# Patient Record
Sex: Female | Born: 1985
Health system: Southern US, Community
[De-identification: ages and names within clinical notes are randomized; demographics above are authoritative.]

## PROBLEM LIST (undated history)

## (undated) ENCOUNTER — Ambulatory Visit (HOSPITAL_COMMUNITY)

## (undated) DIAGNOSIS — T7840XA Allergy, unspecified, initial encounter: Secondary | ICD-10-CM

## (undated) DIAGNOSIS — A630 Anogenital (venereal) warts: Secondary | ICD-10-CM

## (undated) HISTORY — DX: Allergy, unspecified, initial encounter: T78.40XA

## (undated) HISTORY — DX: Anogenital (venereal) warts: A63.0

## (undated) HISTORY — PX: LASER ABLATION OF CONDYLOMAS: SHX1948

---
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
## 2005-04-13 ENCOUNTER — Ambulatory Visit (HOSPITAL_COMMUNITY): Admission: RE | Admit: 2005-04-13 | Discharge: 2005-04-13 | Payer: Self-pay | Admitting: Obstetrics & Gynecology

## 2005-05-03 ENCOUNTER — Inpatient Hospital Stay (HOSPITAL_COMMUNITY): Admission: AD | Admit: 2005-05-03 | Discharge: 2005-05-03 | Payer: Self-pay | Admitting: Obstetrics

## 2005-06-04 ENCOUNTER — Observation Stay (HOSPITAL_COMMUNITY): Admission: AD | Admit: 2005-06-04 | Discharge: 2005-06-05 | Payer: Self-pay | Admitting: Obstetrics

## 2005-06-16 ENCOUNTER — Ambulatory Visit (HOSPITAL_COMMUNITY): Admission: RE | Admit: 2005-06-16 | Discharge: 2005-06-16 | Payer: Self-pay | Admitting: Obstetrics & Gynecology

## 2005-08-16 ENCOUNTER — Inpatient Hospital Stay (HOSPITAL_COMMUNITY): Admission: AD | Admit: 2005-08-16 | Discharge: 2005-08-17 | Payer: Self-pay | Admitting: Obstetrics & Gynecology

## 2005-09-18 ENCOUNTER — Inpatient Hospital Stay (HOSPITAL_COMMUNITY): Admission: RE | Admit: 2005-09-18 | Discharge: 2005-09-18 | Payer: Self-pay | Admitting: Obstetrics & Gynecology

## 2005-10-16 ENCOUNTER — Observation Stay (HOSPITAL_COMMUNITY): Admission: AD | Admit: 2005-10-16 | Discharge: 2005-10-16 | Payer: Self-pay | Admitting: Obstetrics

## 2005-10-17 ENCOUNTER — Observation Stay (HOSPITAL_COMMUNITY): Admission: AD | Admit: 2005-10-17 | Discharge: 2005-10-17 | Payer: Self-pay | Admitting: Obstetrics

## 2005-10-20 ENCOUNTER — Inpatient Hospital Stay (HOSPITAL_COMMUNITY): Admission: AD | Admit: 2005-10-20 | Discharge: 2005-10-20 | Payer: Self-pay | Admitting: Obstetrics

## 2005-11-07 ENCOUNTER — Inpatient Hospital Stay (HOSPITAL_COMMUNITY): Admission: AD | Admit: 2005-11-07 | Discharge: 2005-11-09 | Payer: Self-pay | Admitting: Obstetrics & Gynecology

## 2006-04-22 ENCOUNTER — Inpatient Hospital Stay (HOSPITAL_COMMUNITY): Admission: AD | Admit: 2006-04-22 | Discharge: 2006-04-22 | Payer: Self-pay | Admitting: Obstetrics

## 2007-12-22 ENCOUNTER — Emergency Department (HOSPITAL_COMMUNITY): Admission: EM | Admit: 2007-12-22 | Discharge: 2007-12-22 | Payer: Self-pay | Admitting: Emergency Medicine

## 2008-03-21 ENCOUNTER — Inpatient Hospital Stay (HOSPITAL_COMMUNITY): Admission: AD | Admit: 2008-03-21 | Discharge: 2008-03-21 | Payer: Self-pay | Admitting: Obstetrics and Gynecology

## 2008-03-28 ENCOUNTER — Inpatient Hospital Stay (HOSPITAL_COMMUNITY): Admission: AD | Admit: 2008-03-28 | Discharge: 2008-03-28 | Payer: Self-pay | Admitting: Obstetrics & Gynecology

## 2008-05-10 ENCOUNTER — Emergency Department (HOSPITAL_COMMUNITY): Admission: EM | Admit: 2008-05-10 | Discharge: 2008-05-10 | Payer: Self-pay | Admitting: Emergency Medicine

## 2008-05-29 ENCOUNTER — Ambulatory Visit (HOSPITAL_COMMUNITY): Admission: RE | Admit: 2008-05-29 | Discharge: 2008-05-29 | Payer: Self-pay | Admitting: Obstetrics

## 2008-12-08 ENCOUNTER — Emergency Department (HOSPITAL_COMMUNITY): Admission: EM | Admit: 2008-12-08 | Discharge: 2008-12-08 | Payer: Self-pay | Admitting: Emergency Medicine

## 2008-12-21 ENCOUNTER — Inpatient Hospital Stay (HOSPITAL_COMMUNITY): Admission: AD | Admit: 2008-12-21 | Discharge: 2008-12-22 | Payer: Self-pay | Admitting: Obstetrics & Gynecology

## 2009-01-16 ENCOUNTER — Inpatient Hospital Stay (HOSPITAL_COMMUNITY): Admission: AD | Admit: 2009-01-16 | Discharge: 2009-01-16 | Payer: Self-pay | Admitting: Obstetrics & Gynecology

## 2009-02-23 ENCOUNTER — Inpatient Hospital Stay (HOSPITAL_COMMUNITY): Admission: AD | Admit: 2009-02-23 | Discharge: 2009-02-23 | Payer: Self-pay | Admitting: Obstetrics & Gynecology

## 2009-03-17 ENCOUNTER — Ambulatory Visit (HOSPITAL_COMMUNITY): Admission: RE | Admit: 2009-03-17 | Discharge: 2009-03-17 | Payer: Self-pay | Admitting: Obstetrics & Gynecology

## 2009-04-19 ENCOUNTER — Inpatient Hospital Stay (HOSPITAL_COMMUNITY): Admission: AD | Admit: 2009-04-19 | Discharge: 2009-04-19 | Payer: Self-pay | Admitting: Obstetrics

## 2009-04-19 ENCOUNTER — Ambulatory Visit: Payer: Self-pay | Admitting: Family

## 2009-06-27 ENCOUNTER — Inpatient Hospital Stay (HOSPITAL_COMMUNITY): Admission: AD | Admit: 2009-06-27 | Discharge: 2009-06-27 | Payer: Self-pay | Admitting: Obstetrics & Gynecology

## 2009-07-08 ENCOUNTER — Ambulatory Visit (HOSPITAL_COMMUNITY): Admission: RE | Admit: 2009-07-08 | Discharge: 2009-07-08 | Payer: Self-pay | Admitting: Obstetrics & Gynecology

## 2009-08-04 ENCOUNTER — Inpatient Hospital Stay (HOSPITAL_COMMUNITY): Admission: AD | Admit: 2009-08-04 | Discharge: 2009-08-04 | Payer: Self-pay | Admitting: Obstetrics & Gynecology

## 2009-08-05 ENCOUNTER — Inpatient Hospital Stay (HOSPITAL_COMMUNITY): Admission: AD | Admit: 2009-08-05 | Discharge: 2009-08-07 | Payer: Self-pay | Admitting: Obstetrics

## 2010-07-21 ENCOUNTER — Emergency Department (HOSPITAL_COMMUNITY): Admission: EM | Admit: 2010-07-21 | Discharge: 2010-07-21 | Payer: Self-pay | Admitting: Emergency Medicine

## 2010-12-04 ENCOUNTER — Ambulatory Visit (INDEPENDENT_AMBULATORY_CARE_PROVIDER_SITE_OTHER): Payer: Self-pay

## 2010-12-04 ENCOUNTER — Inpatient Hospital Stay (INDEPENDENT_AMBULATORY_CARE_PROVIDER_SITE_OTHER)
Admission: RE | Admit: 2010-12-04 | Discharge: 2010-12-04 | Disposition: A | Discharge status: Home / Self Care | Payer: Self-pay | Source: Ambulatory Visit | Attending: Family Medicine | Admitting: Family Medicine

## 2010-12-04 DIAGNOSIS — N760 Acute vaginitis: Secondary | ICD-10-CM

## 2010-12-04 DIAGNOSIS — R109 Unspecified abdominal pain: Secondary | ICD-10-CM

## 2010-12-04 LAB — WET PREP, GENITAL
Clue Cells Wet Prep HPF POC: NONE SEEN
Yeast Wet Prep HPF POC: NONE SEEN

## 2010-12-04 LAB — POCT URINALYSIS DIP (DEVICE)
Bilirubin Urine: NEGATIVE
Hgb urine dipstick: NEGATIVE
Ketones, ur: NEGATIVE mg/dL
Nitrite: NEGATIVE
Protein, ur: NEGATIVE mg/dL
Specific Gravity, Urine: 1.02 (ref 1.005–1.030)
Urobilinogen, UA: 0.2 mg/dL (ref 0.0–1.0)
pH: 7 (ref 5.0–8.0)

## 2010-12-05 LAB — GC/CHLAMYDIA PROBE AMP, GENITAL
Chlamydia, DNA Probe: NEGATIVE
GC Probe Amp, Genital: NEGATIVE

## 2010-12-06 LAB — CBC
HCT: 39.6 % (ref 36.0–46.0)
Hemoglobin: 13.3 g/dL (ref 12.0–15.0)
MCHC: 33.7 g/dL (ref 30.0–36.0)
MCV: 96.9 fL (ref 78.0–100.0)
Platelets: 129 10*3/uL — ABNORMAL LOW (ref 150–400)
Platelets: 138 10*3/uL — ABNORMAL LOW (ref 150–400)
RBC: 3.41 MIL/uL — ABNORMAL LOW (ref 3.87–5.11)
RBC: 4.09 MIL/uL (ref 3.87–5.11)
RDW: 13.6 % (ref 11.5–15.5)
WBC: 10.8 10*3/uL — ABNORMAL HIGH (ref 4.0–10.5)
WBC: 12.9 10*3/uL — ABNORMAL HIGH (ref 4.0–10.5)

## 2010-12-06 LAB — HEPATITIS B SURFACE ANTIGEN: Hepatitis B Surface Ag: NEGATIVE

## 2010-12-06 LAB — RPR
RPR Ser Ql: NONREACTIVE
RPR Ser Ql: NONREACTIVE

## 2010-12-06 LAB — DIFFERENTIAL
Basophils Absolute: 0 10*3/uL (ref 0.0–0.1)
Basophils Relative: 0 % (ref 0–1)
Eosinophils Absolute: 0 10*3/uL (ref 0.0–0.7)
Eosinophils Relative: 0 % (ref 0–5)
Lymphocytes Relative: 10 % — ABNORMAL LOW (ref 12–46)
Lymphs Abs: 1.1 10*3/uL (ref 0.7–4.0)
Monocytes Absolute: 0.9 10*3/uL (ref 0.1–1.0)
Monocytes Relative: 8 % (ref 3–12)
Neutro Abs: 8.9 10*3/uL — ABNORMAL HIGH (ref 1.7–7.7)
Neutrophils Relative %: 82 % — ABNORMAL HIGH (ref 43–77)

## 2010-12-06 LAB — TYPE AND SCREEN: ABO/RH(D): A POS

## 2010-12-06 LAB — ABO/RH: ABO/RH(D): A POS

## 2010-12-08 LAB — GC/CHLAMYDIA PROBE AMP, GENITAL: Chlamydia, DNA Probe: NEGATIVE

## 2010-12-08 LAB — WET PREP, GENITAL
Trich, Wet Prep: NONE SEEN
Yeast Wet Prep HPF POC: NONE SEEN

## 2010-12-10 LAB — URINALYSIS, ROUTINE W REFLEX MICROSCOPIC
Bilirubin Urine: NEGATIVE
Glucose, UA: NEGATIVE mg/dL
Ketones, ur: NEGATIVE mg/dL
Protein, ur: NEGATIVE mg/dL
pH: 6.5 (ref 5.0–8.0)

## 2010-12-10 LAB — CBC
Hemoglobin: 11.9 g/dL — ABNORMAL LOW (ref 12.0–15.0)
RBC: 3.58 MIL/uL — ABNORMAL LOW (ref 3.87–5.11)
RDW: 13.5 % (ref 11.5–15.5)
WBC: 9.9 10*3/uL (ref 4.0–10.5)

## 2010-12-10 LAB — COMPREHENSIVE METABOLIC PANEL
Albumin: 3.3 g/dL — ABNORMAL LOW (ref 3.5–5.2)
Alkaline Phosphatase: 56 U/L (ref 39–117)
BUN: 3 mg/dL — ABNORMAL LOW (ref 6–23)
CO2: 26 mEq/L (ref 19–32)
Chloride: 106 mEq/L (ref 96–112)
Glucose, Bld: 69 mg/dL — ABNORMAL LOW (ref 70–99)
Potassium: 3.8 mEq/L (ref 3.5–5.1)
Total Bilirubin: 0.5 mg/dL (ref 0.3–1.2)

## 2010-12-10 LAB — URINE MICROSCOPIC-ADD ON

## 2010-12-12 LAB — URINALYSIS, ROUTINE W REFLEX MICROSCOPIC
Bilirubin Urine: NEGATIVE
Leukocytes, UA: NEGATIVE
Nitrite: NEGATIVE
Specific Gravity, Urine: 1.025 (ref 1.005–1.030)
Urobilinogen, UA: 0.2 mg/dL (ref 0.0–1.0)

## 2010-12-12 LAB — URINE CULTURE: Colony Count: 40000

## 2010-12-12 LAB — URINE MICROSCOPIC-ADD ON

## 2010-12-12 LAB — WET PREP, GENITAL
Trich, Wet Prep: NONE SEEN
Yeast Wet Prep HPF POC: NONE SEEN

## 2010-12-12 LAB — GC/CHLAMYDIA PROBE AMP, GENITAL: GC Probe Amp, Genital: NEGATIVE

## 2010-12-13 LAB — GC/CHLAMYDIA PROBE AMP, GENITAL: GC Probe Amp, Genital: NEGATIVE

## 2010-12-13 LAB — URINE MICROSCOPIC-ADD ON

## 2010-12-13 LAB — URINALYSIS, ROUTINE W REFLEX MICROSCOPIC
Bilirubin Urine: NEGATIVE
Specific Gravity, Urine: 1.015 (ref 1.005–1.030)
pH: 6.5 (ref 5.0–8.0)

## 2010-12-14 LAB — WET PREP, GENITAL: Yeast Wet Prep HPF POC: NONE SEEN

## 2010-12-14 LAB — COMPREHENSIVE METABOLIC PANEL
AST: 18 U/L (ref 0–37)
Alkaline Phosphatase: 41 U/L (ref 39–117)
CO2: 27 mEq/L (ref 19–32)
Chloride: 103 mEq/L (ref 96–112)
Creatinine, Ser: 0.57 mg/dL (ref 0.4–1.2)
GFR calc Af Amer: >60 mL/min (ref >60)
GFR calc non Af Amer: >60 mL/min (ref >60)
Total Bilirubin: 0.3 mg/dL (ref 0.3–1.2)

## 2010-12-14 LAB — CBC
HCT: 36 % (ref 36.0–46.0)
MCV: 91.2 fL (ref 78.0–100.0)
RBC: 3.94 MIL/uL (ref 3.87–5.11)
WBC: 10.5 10*3/uL (ref 4.0–10.5)

## 2010-12-14 LAB — RAPID STREP SCREEN (MED CTR MEBANE ONLY): Streptococcus, Group A Screen (Direct): POSITIVE — AB

## 2010-12-14 LAB — URINALYSIS, ROUTINE W REFLEX MICROSCOPIC
Glucose, UA: NEGATIVE mg/dL
Hgb urine dipstick: NEGATIVE
Specific Gravity, Urine: 1.01 (ref 1.005–1.030)
Urobilinogen, UA: 0.2 mg/dL (ref 0.0–1.0)

## 2011-01-17 NOTE — Op Note (Signed)
NAME:  Caroline Jensen, Caroline Jensen                ACCOUNT NO.:  [id number]   MEDICAL RECORD NO.:  [id number]          PATIENT TYPE:  AMB   LOCATION:  SDC                           FACILITY:  [facility]   PHYSICIAN:  Charles A. Clearance Coots, M.D.DATE OF BIRTH:  [birth year]   DATE OF PROCEDURE:  05/29/2008  DATE OF DISCHARGE:                               OPERATIVE REPORT   PREOPERATIVE DIAGNOSIS:  Genital condyloma.   POSTOPERATIVE DIAGNOSIS:  Genital condyloma.   PROCEDURE:  Laser vaporization of condyloma of vulva.   SURGEON:  Charles A. Clearance Coots, MD   ANESTHESIA:  General.   ESTIMATED BLOOD LOSS:  Minimal.   FINDINGS:  Condyloma of the lower vulva and midline introital areas.   COMPLICATIONS:  None.   SPECIMENS:  None   OPERATION:  The patient was brought to the operating room after  satisfactory general endotracheal anesthesia.  The legs were brought up  in stirrups and the vagina was prepped and draped in usual sterile  fashion.  The areas of condyloma were identified and were moistened with  5% acetic acid solution.  The areas of condyloma were then ablated with  CO2 laser at 10 watts, 50% mixture of 5% Xylocaine ointment, and  Silvadene cream was applied to the areas of laser ablation.  The  procedure was then terminated.  The patient was transported to the  recovery room in satisfactory condition.      Charles A. Clearance Coots, M.D.  Electronically Signed     CAH/MEDQ  D:  05/29/2008  T:  05/30/2008  Job:  [id number]

## 2011-01-20 NOTE — Discharge Summary (Signed)
NAME:  Caroline Jensen, Caroline Jensen                          ACCOUNT NO.:  [id number]   MEDICAL RECORD NO.:  [id number]                   PATIENT TYPE:  INP   LOCATION:  9158                                 [facility]ACILITY:  [facility]   PHYSICIAN:  Roseanna Rainbow, M.D.         DATE OF BIRTH:  April 17, 1986   DATE OF ADMISSION:  05/26/2003  DATE OF DISCHARGE:  06/01/2003                                 DISCHARGE SUMMARY   CHIEF COMPLAINT:  The patient is a 25 year old gravida 1, para 0 who  presents at 28-5/7 weeks complaining of uterine contractions after  intercourse.   HISTORY OF PRESENT ILLNESS:  As above.  She denies rupture of membranes or  vaginal bleeding.  She reports good fetal activity.  Prenatal course was  with care at [facility] with onset of care at 6-8 weeks.  No significant  pregnancy complications or risks.   MEDICATIONS:  Prenatal vitamins.   ALLERGIES:  No known drug allergies.   PHYSICAL EXAMINATION:  VITAL SIGNS:  Temperature 99.8.  GENERAL:  No apparent distress.  PELVIC:  On speculum examination the cervix appeared closed.  On digital  examination the cervix was fingertip, soft, 50%.  Fetal heart rate baseline  rate of 150s and reassuring.   IMPRESSION:  An intrauterine pregnancy at 28-5/7 weeks with preterm uterine  contractions, rule out preterm labor.   PLAN:  Check urinalysis, urine culture and sensitivity, and obtain a  complete OB ultrasound.   HOSPITAL COURSE:  The ultrasound demonstrated a normal amniotic fluid index,  appropriate growth.  However, the cervix was found to have dynamic funneling  of the internal os and the internal os was dilated to 2.6 cm and there was  minimal dilatation of cervix, with a cervical length of only 2-3 mm.  At  this point the patient was felt to be in preterm labor and she was admitted  for magnesium sulfate tocolysis.  She was easily tocolyzed and the magnesium  sulfate was continued for 72 hours.  At this point a repeat digital  examination was without change.  A repeat ultrasound demonstrated the cervix  to be 4.6 cm in length without any dynamic change.  She is discharged to  home.   DISCHARGE DIAGNOSES:  1. Threatened preterm labor.  2. Intrauterine pregnancy at 28+ weeks.   CONDITION ON DISCHARGE:  Stable.   DIET:  Regular.   ACTIVITY:  Bed rest.    DISCHARGE MEDICATIONS:  Resume home medications.   DISPOSITION:  The patient was to follow up in the office in one week.                                               Roseanna Rainbow, M.D.    Judee Clara  D:  07/09/2003  T:  07/10/2003  Job:  [id number]  

## 2011-06-02 LAB — WET PREP, GENITAL: Trich, Wet Prep: NONE SEEN

## 2011-06-02 LAB — CBC
MCHC: 34.2
Platelets: 185
RBC: 3.43 — ABNORMAL LOW
RDW: 13.6

## 2011-06-02 LAB — HCG, SERUM, QUALITATIVE: Preg, Serum: NEGATIVE

## 2011-06-02 LAB — SAMPLE TO BLOOD BANK

## 2011-06-05 LAB — CBC
RBC: 3.9
WBC: 4.9

## 2011-06-05 LAB — PREGNANCY, URINE: Preg Test, Ur: NEGATIVE

## 2012-02-13 ENCOUNTER — Encounter (HOSPITAL_COMMUNITY): Payer: Self-pay | Admitting: Emergency Medicine

## 2012-02-13 ENCOUNTER — Emergency Department (HOSPITAL_COMMUNITY)
Admission: EM | Admit: 2012-02-13 | Discharge: 2012-02-14 | Disposition: A | Discharge status: Home / Self Care | Payer: 59 | Attending: Emergency Medicine | Admitting: Emergency Medicine

## 2012-02-13 DIAGNOSIS — J020 Streptococcal pharyngitis: Secondary | ICD-10-CM | POA: Insufficient documentation

## 2012-02-13 LAB — RAPID STREP SCREEN (MED CTR MEBANE ONLY): Streptococcus, Group A Screen (Direct): POSITIVE — AB

## 2012-02-13 NOTE — ED Notes (Signed)
PT. REPORTS SORE THROAT WITH CHILLS , HEADACHE AND GENERALIZED WEAKNESS / BODY ACHES ONSET YESTERDAY.

## 2012-02-14 MED ORDER — PENICILLIN G BENZATHINE 1200000 UNIT/2ML IM SUSP
1.2000 10*6.[IU] | Freq: Once | INTRAMUSCULAR | Status: DC
  Filled 2012-02-14: qty 2

## 2012-02-14 NOTE — ED Provider Notes (Signed)
History     CSN: [id number]  Arrival date & time 02/13/12  2313   First MD Initiated Contact with Patient 02/13/12 2356      Chief Complaint  Patient presents with  . Sore Throat    HPI  History provided by the patient. Patient is a 26 year old female with no significant past medical history who presents with complaints of sore throat for the past 2 days. Patient states that pain began on the outside of neck and throat with increasing sore throat symptoms. Pain is worse with swallowing or eating. Patient has taken some over-the-counter pain medications without relief. Patient has associated fever, chills and general fatigue and body weakness. Patient denies any nausea vomiting symptoms. Denies any cough, nasal congestion or rhinorrhea. Patient does not know of any sick contacts.     History reviewed. No pertinent past medical history.  History reviewed. No pertinent past surgical history.  No family history on file.  History  Substance Use Topics  . Smoking status: Never Smoker   . Smokeless tobacco: Not on file  . Alcohol Use: No    OB History    Grav Para Term Preterm Abortions TAB SAB Ect Mult Living                  Review of Systems  Constitutional: Positive for fever, chills and fatigue.  HENT: Positive for congestion and sore throat. Negative for rhinorrhea.   Respiratory: Negative for cough.   Cardiovascular: Negative for chest pain.  Gastrointestinal: Negative for nausea and vomiting.  Neurological: Positive for headaches.    Allergies  Review of patient's allergies indicates no known allergies.  Home Medications  No current outpatient prescriptions on file.  BP 102/69  Pulse 89  Temp(Src) 98.6 F (37 C) (Oral)  Resp 19  SpO2 100%  LMP 02/05/2012  Physical Exam  Nursing note and vitals reviewed. Constitutional: She is oriented to person, place, and time. She appears well-developed and well-nourished. No distress.  HENT:  Head: Normocephalic  and atraumatic.       Erythema to the pharynx and tonsils. Tonsils mildly enlarged with exudate. Uvula midline. No signs for PTA.  Neck: Normal range of motion. Neck supple.  Cardiovascular: Normal rate and regular rhythm.   Pulmonary/Chest: Effort normal and breath sounds normal. No respiratory distress. She has no wheezes. She has no rales.  Abdominal: Soft.  Lymphadenopathy:    She has cervical adenopathy.  Neurological: She is alert and oriented to person, place, and time.  Skin: Skin is warm and dry. No rash noted.  Psychiatric: She has a normal mood and affect. Her behavior is normal.    ED Course  Procedures    Labs Reviewed  RAPID STREP SCREEN - Abnormal; Notable for the following:    Streptococcus, Group A Screen (Direct) POSITIVE (*)    All other components within normal limits     1. Strep throat       MDM  Patient seen and evaluated. Patient in no acute distress.    Positive strep throat today we'll give one-time dose of penicillin IM.     Angus Seller, [place] 02/15/12 [phone]

## 2012-02-14 NOTE — Discharge Instructions (Signed)
You were seen and evaluated for your complaints of sore throat. Your strep throat test was positive. You were given a one-time dose of penicillin to treat your infection. Your symptoms should improve over the next few days. Use warm saltwater rinses to help with your sore throat symptoms or over-the-counter throat lozenges. Return if your symptoms are not improving or you have worsening pain, difficulty swallowing or difficulty breathing.    Strep Infections Streptococcal (strep) infections are caused by streptococcal germs (bacteria). Strep infections are very contagious. Strep infections can occur in:  Ears.   The nose.   The throat.   Sinuses.   Skin.   Blood.   Lungs.   Spinal fluid.   Urine.  Strep throat is the most common bacterial infection in children. The symptoms of a Strep infection usually get better in 2 to 3 days after starting medicine that kills germs (antibiotics). Strep is usually not contagious after 36 to 48 hours of antibiotic treatment. Strep infections that are not treated can cause serious complications. These include gland infections, throat abscess, rheumatic fever and kidney disease. DIAGNOSIS  The diagnosis of strep is made by:  A culture for the strep germ.  TREATMENT  These infections require oral antibiotics for a full 10 days, an antibiotic shot or antibiotics given into the vein (intravenous, IV). HOME CARE INSTRUCTIONS   Be sure to finish all antibiotics even if feeling better.   Only take over-the-counter medicines for pain, discomfort and or fever, as directed by your caregiver.   Close contacts that have a fever, sore throat or illness symptoms should see their caregiver right away.   You or your child may return to work, school or daycare if the fever and pain are better in 2 to 3 days after starting antibiotics.  SEEK MEDICAL CARE IF:   You or your child has an oral temperature above 102 F (38.9 C).   Your baby is older than 3  months with a rectal temperature of 100.5 F (38.1 C) or higher for more than 1 day.   You or your child is not better in 3 days.  SEEK IMMEDIATE MEDICAL CARE IF:   You or your child has an oral temperature above 102 F (38.9 C), not controlled by medicine.   Your baby is older than 3 months with a rectal temperature of 102 F (38.9 C) or higher.   Your baby is 85 months old or younger with a rectal temperature of 100.4 F (38 C) or higher.   There is a spreading rash.   There is difficulty swallowing or breathing.   There is increased pain or swelling.  Document Released: 09/28/2004 Document Revised: 08/10/2011 Document Reviewed: 07/07/2009 [facility] Patient Information 2012 [place], [place].

## 2012-02-16 NOTE — ED Provider Notes (Signed)
Medical screening examination/treatment/procedure(s) were performed by non-physician practitioner and as supervising physician I was immediately available for consultation/collaboration.  Olivia Mackie, MD 02/16/12 1525

## 2012-12-30 ENCOUNTER — Ambulatory Visit: Payer: Self-pay | Admitting: Obstetrics & Gynecology

## 2013-01-16 ENCOUNTER — Ambulatory Visit (INDEPENDENT_AMBULATORY_CARE_PROVIDER_SITE_OTHER): Payer: Medicaid Other | Admitting: Obstetrics & Gynecology

## 2013-01-16 ENCOUNTER — Encounter: Payer: Self-pay | Admitting: Obstetrics & Gynecology

## 2013-01-16 VITALS — BP 101/67 | HR 76 | Temp 98.5°F | Ht 63.0 in | Wt 106.4 lb

## 2013-01-16 DIAGNOSIS — N898 Other specified noninflammatory disorders of vagina: Secondary | ICD-10-CM | POA: Insufficient documentation

## 2013-01-16 DIAGNOSIS — Z30432 Encounter for removal of intrauterine contraceptive device: Secondary | ICD-10-CM

## 2013-01-16 LAB — POCT WET PREP (WET MOUNT)

## 2013-01-16 NOTE — Patient Instructions (Signed)
Vaginitis Vaginitis is an infection. It causes soreness, swelling, and redness (inflammation) of the vagina. Many of these infections are sexually transmitted diseases (STDs). Having unprotected sex can cause further problems and complications such as: Chronic pelvic pain. Infertility. Unwanted pregnancy. Abortion. Tubal pregnancy. Infection passed on to the newborn. Cancer. CAUSES  Monilia. This is a yeast or fungus infection, not an STD. Bacterial vaginosis. The normal balance of bacteria in the vagina is disrupted and is replaced by an overgrowth of certain bacteria. Gonorrhea, chlamydia. These are bacterial infections that are STDs. Vaginal sponges, diaphragms, and intrauterine devices. Trichomoniasis. This is a STD infection caused by a parasite. Viruses like herpes and human papillomavirus. Both are STDs. Pregnancy. Immunosuppression. This occurs with certain conditions such as HIV infection or cancer. Using bubble bath. Taking certain antibiotic medicines. Sporadic recurrence can occur if you become sick. Diabetes. Steroids. Allergic reaction. If you have an allergy to: Douches. Soaps. Spermicides. Condoms. Scented tampons or vaginal sprays. SYMPTOMS  Abnormal vaginal discharge. Itching of the vagina. Pain in the vagina. Swelling of the vagina. In some cases, there are no symptoms. TREATMENT  Treatment will vary depending on the type of infection. Bacteria or trichomonas are usually treated with oral antibiotics and sometimes vaginal cream or suppositories. Monilia vaginitis is usually treated with vaginal creams, suppositories, or oral antifungal pills. Viral vaginitis has no cure. However, the symptoms of herpes (a viral vaginitis) can be treated to relieve the discomfort. Human papillomavirus has no symptoms. However, there are treatments for the diseases caused by human papillomavirus. With allergic vaginitis, you need to stop using the product that is causing the  problem. Vaginal creams can be used to treat the symptoms. When treating an STD, the sex partner should also be treated. HOME CARE INSTRUCTIONS  Take all the medicines as directed by your caregiver. Do not use scented tampons, soaps, or vaginal sprays. Do not douche. Tell your sex partner if you have a vaginal infection or an STD. Do not have sexual intercourse until you have treated the vaginitis. Practice safe sex by using condoms. SEEK MEDICAL CARE IF:  You have abdominal pain. Your symptoms get worse during treatment. Document Released: 06/18/2007 Document Revised: 11/13/2011 Document Reviewed: 02/11/2009 [facility] Patient Information 2013 [place], [place]. Contraception Choices Contraception (birth control) is the use of any methods or devices to prevent pregnancy. Below are some methods to help avoid pregnancy. HORMONAL METHODS   Contraceptive implant. This is a thin, plastic tube containing progesterone hormone. It does not contain estrogen hormone. Your caregiver inserts the tube in the inner part of the upper arm. The tube can remain in place for up to 3 years. After 3 years, the implant must be removed. The implant prevents the ovaries from releasing an egg (ovulation), thickens the cervical mucus which prevents sperm from entering the uterus, and thins the lining of the inside of the uterus.  Progesterone-only injections. These injections are given every 3 months by your caregiver to prevent pregnancy. This synthetic progesterone hormone stops the ovaries from releasing eggs. It also thickens cervical mucus and changes the uterine lining. This makes it harder for sperm to survive in the uterus.  Birth control pills. These pills contain estrogen and progesterone hormone. They work by stopping the egg from forming in the ovary (ovulation). Birth control pills are prescribed by a caregiver.Birth control pills can also be used to treat heavy periods.  Minipill. This type of birth  control pill contains only the progesterone hormone. They are taken every  day of each month and must be prescribed by your caregiver.  Birth control patch. The patch contains hormones similar to those in birth control pills. It must be changed once a week and is prescribed by a caregiver.  Vaginal ring. The ring contains hormones similar to those in birth control pills. It is left in the vagina for 3 weeks, removed for 1 week, and then a new one is put back in place. The patient must be comfortable inserting and removing the ring from the vagina.A caregiver's prescription is necessary.  Emergency contraception. Emergency contraceptives prevent pregnancy after unprotected sexual intercourse. This pill can be taken right after sex or up to 5 days after unprotected sex. It is most effective the sooner you take the pills after having sexual intercourse. Emergency contraceptive pills are available without a prescription. Check with your pharmacist. Do not use emergency contraception as your only form of birth control. BARRIER METHODS   Female condom. This is a thin sheath (latex or rubber) that is worn over the penis during sexual intercourse. It can be used with spermicide to increase effectiveness.  Female condom. This is a soft, loose-fitting sheath that is put into the vagina before sexual intercourse.  Diaphragm. This is a soft, latex, dome-shaped barrier that must be fitted by a caregiver. It is inserted into the vagina, along with a spermicidal jelly. It is inserted before intercourse. The diaphragm should be left in the vagina for 6 to 8 hours after intercourse.  Cervical cap. This is a round, soft, latex or plastic cup that fits over the cervix and must be fitted by a caregiver. The cap can be left in place for up to 48 hours after intercourse.  Sponge. This is a soft, circular piece of polyurethane foam. The sponge has spermicide in it. It is inserted into the vagina after wetting it and before  sexual intercourse.  Spermicides. These are chemicals that kill or block sperm from entering the cervix and uterus. They come in the form of creams, jellies, suppositories, foam, or tablets. They do not require a prescription. They are inserted into the vagina with an applicator before having sexual intercourse. The process must be repeated every time you have sexual intercourse. INTRAUTERINE CONTRACEPTION  Intrauterine device (IUD). This is a T-shaped device that is put in a woman's uterus during a menstrual period to prevent pregnancy. There are 2 types:  Copper IUD. This type of IUD is wrapped in copper wire and is placed inside the uterus. Copper makes the uterus and fallopian tubes produce a fluid that kills sperm. It can stay in place for 10 years.  Hormone IUD. This type of IUD contains the hormone progestin (synthetic progesterone). The hormone thickens the cervical mucus and prevents sperm from entering the uterus, and it also thins the uterine lining to prevent implantation of a fertilized egg. The hormone can weaken or kill the sperm that get into the uterus. It can stay in place for 5 years. PERMANENT METHODS OF CONTRACEPTION  Female tubal ligation. This is when the woman's fallopian tubes are surgically sealed, tied, or blocked to prevent the egg from traveling to the uterus.  Female sterilization. This is when the female has the tubes that carry sperm tied off (vasectomy).This blocks sperm from entering the vagina during sexual intercourse. After the procedure, the man can still ejaculate fluid (semen). NATURAL PLANNING METHODS  Natural family planning. This is not having sexual intercourse or using a barrier method (condom, diaphragm, cervical cap) on  days the woman could become pregnant.  Calendar method. This is keeping track of the length of each menstrual cycle and identifying when you are fertile.  Ovulation method. This is avoiding sexual intercourse during  ovulation.  Symptothermal method. This is avoiding sexual intercourse during ovulation, using a thermometer and ovulation symptoms.  Post-ovulation method. This is timing sexual intercourse after you have ovulated. Regardless of which type or method of contraception you choose, it is important that you use condoms to protect against the transmission of sexually transmitted diseases (STDs). Talk with your caregiver about which form of contraception is most appropriate for you. Document Released: 08/21/2005 Document Revised: 11/13/2011 Document Reviewed: 12/28/2010 [facility] Patient Information 2013 [place], [place].

## 2013-01-16 NOTE — Progress Notes (Signed)
Subjective:    Caroline Jensen is a 27 y.o. female who presents for removal of IUD.She has decided not to precede with additional birth control at this time The patient has no complaints today. The patient is not currently sexually active. Pertinent past medical history: none.   Menarche age:27 Patient's last menstrual period was 01/09/2013.    The following portions of the patient's history were reviewed and updated as appropriate: allergies, current medications, past family history, past medical history, past social history, past surgical history and problem list.  Review of Systems Genitourinary:positive for vaginal discharge   Objective:    SPEC: slightly purulent discharge; IUD removed intact  Assessment:    27 y.o., s/sp IUD removal ?vaginitis  Plan:   Check Medical Diagnostic testing Plans abstinence

## 2013-01-20 ENCOUNTER — Encounter: Payer: Self-pay | Admitting: Obstetrics & Gynecology

## 2013-02-06 ENCOUNTER — Telehealth: Payer: Self-pay | Admitting: *Deleted

## 2013-02-06 NOTE — Telephone Encounter (Signed)
Patient called regarding test results.    5:21pm.  LM ON VM TO CB.

## 2013-02-28 ENCOUNTER — Other Ambulatory Visit: Payer: Self-pay | Admitting: *Deleted

## 2013-02-28 MED ORDER — FLUCONAZOLE 150 MG PO TABS: 150.0000 mg | ORAL_TABLET | Freq: Once | ORAL | Status: DC

## 2013-03-01 ENCOUNTER — Encounter: Payer: Self-pay | Admitting: Obstetrics

## 2014-07-06 ENCOUNTER — Encounter: Payer: Self-pay | Admitting: Obstetrics & Gynecology

## 2014-08-31 ENCOUNTER — Encounter: Payer: Self-pay | Admitting: *Deleted

## 2014-09-01 ENCOUNTER — Encounter: Payer: Self-pay | Admitting: Obstetrics & Gynecology

## 2014-10-16 ENCOUNTER — Encounter: Payer: Self-pay | Admitting: Certified Nurse Midwife

## 2014-10-16 ENCOUNTER — Ambulatory Visit (INDEPENDENT_AMBULATORY_CARE_PROVIDER_SITE_OTHER): Payer: Medicaid Other | Admitting: Certified Nurse Midwife

## 2014-10-16 VITALS — BP 102/66 | HR 64 | Temp 98.4°F | Ht 60.0 in | Wt 104.0 lb

## 2014-10-16 DIAGNOSIS — N760 Acute vaginitis: Secondary | ICD-10-CM | POA: Diagnosis not present

## 2014-10-16 DIAGNOSIS — Z0000 Encounter for general adult medical examination without abnormal findings: Secondary | ICD-10-CM

## 2014-10-16 DIAGNOSIS — R5382 Chronic fatigue, unspecified: Secondary | ICD-10-CM

## 2014-10-16 DIAGNOSIS — Z01419 Encounter for gynecological examination (general) (routine) without abnormal findings: Secondary | ICD-10-CM

## 2014-10-16 LAB — TSH: TSH: 0.312 u[IU]/mL — ABNORMAL LOW (ref 0.350–4.500)

## 2014-10-16 MED ORDER — METRONIDAZOLE 500 MG PO TABS: 500.0000 mg | ORAL_TABLET | Freq: Two times a day (BID) | ORAL | Status: AC

## 2014-10-16 MED ORDER — HYDROCORTISONE 1 % EX CREA: TOPICAL_CREAM | CUTANEOUS | Status: AC

## 2014-10-16 NOTE — Progress Notes (Signed)
Patient ID: Caroline Jensen, female   DOB: [birth year], 29 y.o.   MRN: [id number]    Subjective:     Caroline Jensen is a 29 y.o. female here for a routine exam.  Current complaints: increase in "fishy smelling" vaginal discharge, labial itching and needs annual exam.    Personal health questionnaire:  Is patient Ashkenazi Jewish, have a family history of breast and/or ovarian cancer: no Is there a family history of uterine cancer diagnosed at age < 5, gastrointestinal cancer, urinary tract cancer, family member who is a Field seismologist syndrome-associated carrier: no Is the patient overweight and hypertensive, family history of diabetes, personal history of gestational diabetes, preeclampsia or PCOS: no Is patient over 1, have PCOS,  family history of premature CHD under age 66, diabetes, smoke, have hypertension or peripheral artery disease:  no At any time, has a partner hit, kicked or otherwise hurt or frightened you?: no Over the past 2 weeks, have you felt down, depressed or hopeless?: no Over the past 2 weeks, have you felt little interest or pleasure in doing things?:no   Gynecologic History Patient's last menstrual period was 10/09/2014 (approximate). Contraception: abstinence Last Pap: unknown Last mammogram: None  Obstetric History OB History  Gravida Para Term Preterm AB SAB TAB Ectopic Multiple Living  3 3 2 1           # Outcome Date GA Lbr Len/2nd Weight Sex Delivery Anes PTL Lv  3 Term 2010 [redacted]w[redacted]d   M      2 Term 2007 [redacted]w[redacted]d  2.92 kg (6 lb 7 oz) F      1 Preterm 2004 [redacted]w[redacted]d  2.183 kg (4 lb 13 oz) F          Past Medical History  Diagnosis Date  . Genital warts     Past Surgical History  Procedure Laterality Date  . Laser ablation of condylomas       Current outpatient prescriptions:  .  hydrocortisone cream 1 %, Apply to affected area 2 times daily, Disp: 30 g, Rfl: 1 .  metroNIDAZOLE (FLAGYL) 500 MG tablet, Take 1 tablet (500 mg total) by mouth 2 (two) times daily. For 7  days., Disp: 20 tablet, Rfl: 0 No Known Allergies  History  Substance Use Topics  . Smoking status: Never Smoker   . Smokeless tobacco: Never Used  . Alcohol Use: No    Family History  Problem Relation Age of Onset  . Cancer Neg Hx   . Hypertension Maternal Aunt   . Diabetes Neg Hx   . Heart disease Neg Hx   . Stroke      unknown      Review of Systems  Constitutional: negative for weight loss, current complaint of fatigue Respiratory: negative for cough and wheezing Cardiovascular: negative for chest pain, fatigue and palpitations Gastrointestinal: negative for abdominal pain and change in bowel habits Musculoskeletal:negative for myalgias Neurological: negative for gait problems and tremors Behavioral/Psych: negative for abusive relationship, depression Endocrine: negative for temperature intolerance   Genitourinary:negative for genital lesions, hot flashes, sexual problems.  Reports a change in vaginal discharge for several months with spotting during periods.   Integument/breast: negative for breast lump, breast tenderness, nipple discharge and skin lesion(s). Does not perform SBE.     Objective:       BP 102/66 mmHg  Pulse 64  Temp(Src) 98.4 F (36.9 C)  Ht 5' (1.524 m)  Wt 47.174 kg (104 lb)  BMI 20.31 kg/m2  LMP 10/09/2014 (  Approximate) General:   alert  Skin:   no rash or abnormalities  Lungs:   clear to auscultation bilaterally  Heart:   regular rate and rhythm, S1, S2 normal, no murmur, click, rub or gallop  Breasts:   normal without suspicious masses, skin or nipple changes or axillary nodes  Abdomen:  normal findings: no organomegaly, soft, non-tender and no hernia  Pelvis:  External genitalia: normal general appearance Urinary system: urethral meatus normal and bladder without fullness, nontender Vaginal: normal without tenderness, induration or masses Cervix: normal appearance Adnexa: normal bimanual exam Uterus: anteverted and non-tender, normal  size   Lab Review Urine pregnancy test Labs reviewed yes      Assessment:    Healthy female exam.  Vulvovaginitis Bacterial Vaginosis   Plan:    Education reviewed: safe sex/STD prevention and self breast exams. Contraception: abstinence. Follow up in: 1 year and PRN.    Meds ordered this encounter  Medications  . hydrocortisone cream 1 %    Sig: Apply to affected area 2 times daily    Dispense:  30 g    Refill:  1  . metroNIDAZOLE (FLAGYL) 500 MG tablet    Sig: Take 1 tablet (500 mg total) by mouth 2 (two) times daily. For 7 days.    Dispense:  20 tablet    Refill:  0   Orders Placed This Encounter  Procedures  . SureSwab, Vaginosis/Vaginitis Plus  . HIV antibody  . Hepatitis B surface antigen  . RPR  . Hepatitis C antibody  . CBC with Differential/Platelet  . TSH

## 2014-10-17 LAB — CBC WITH DIFFERENTIAL/PLATELET
Basophils Absolute: 0 10*3/uL (ref 0.0–0.1)
Basophils Relative: 0 % (ref 0–1)
EOS PCT: 1 % (ref 0–5)
Eosinophils Absolute: 0.1 10*3/uL (ref 0.0–0.7)
HEMATOCRIT: 39.6 % (ref 36.0–46.0)
Hemoglobin: 12.9 g/dL (ref 12.0–15.0)
LYMPHS ABS: 1.9 10*3/uL (ref 0.7–4.0)
Lymphocytes Relative: 27 % (ref 12–46)
MCH: 29.9 pg (ref 26.0–34.0)
MCHC: 32.6 g/dL (ref 30.0–36.0)
MCV: 91.7 fL (ref 78.0–100.0)
MONO ABS: 0.8 10*3/uL (ref 0.1–1.0)
MONOS PCT: 11 % (ref 3–12)
MPV: 10.6 fL (ref 8.6–12.4)
Neutro Abs: 4.4 10*3/uL (ref 1.7–7.7)
Neutrophils Relative %: 61 % (ref 43–77)
Platelets: 225 10*3/uL (ref 150–400)
RBC: 4.32 MIL/uL (ref 3.87–5.11)
RDW: 13.3 % (ref 11.5–15.5)
WBC: 7.2 10*3/uL (ref 4.0–10.5)

## 2014-10-17 LAB — HEPATITIS C ANTIBODY: HCV AB: NEGATIVE

## 2014-10-17 LAB — RPR

## 2014-10-17 LAB — HEPATITIS B SURFACE ANTIGEN: Hepatitis B Surface Ag: NEGATIVE

## 2014-10-17 LAB — HIV ANTIBODY (ROUTINE TESTING W REFLEX): HIV 1&2 Ab, 4th Generation: NONREACTIVE

## 2014-10-19 LAB — PAP IG W/ RFLX HPV ASCU

## 2014-10-19 MED ORDER — FLUCONAZOLE 150 MG PO TABS: 150.0000 mg | ORAL_TABLET | Freq: Once | ORAL | Status: DC

## 2014-10-19 NOTE — Addendum Note (Signed)
Addended by: Valli Glance F on: 10/19/2014 02:03 PM   Modules accepted: Orders

## 2014-10-20 ENCOUNTER — Other Ambulatory Visit: Payer: Self-pay | Admitting: Certified Nurse Midwife

## 2014-10-20 DIAGNOSIS — R7989 Other specified abnormal findings of blood chemistry: Secondary | ICD-10-CM

## 2014-10-21 LAB — SURESWAB, VAGINOSIS/VAGINITIS PLUS
Atopobium vaginae: NOT DETECTED Log (cells/mL)
C. albicans, DNA: NOT DETECTED
C. glabrata, DNA: NOT DETECTED
C. parapsilosis, DNA: NOT DETECTED
C. trachomatis RNA, TMA: NOT DETECTED
C. tropicalis, DNA: NOT DETECTED
GARDNERELLA VAGINALIS: 4.8 Log (cells/mL)
LACTOBACILLUS SPECIES: 6.9 Log (cells/mL)
MEGASPHAERA SPECIES: NOT DETECTED Log (cells/mL)
N. gonorrhoeae RNA, TMA: NOT DETECTED
T. vaginalis RNA, QL TMA: NOT DETECTED

## 2014-10-26 NOTE — Progress Notes (Signed)
Call to patient- LM on VM to CB to schedule lab. All labs normal except TSH. Patient needs panel and she is aware.

## 2014-10-27 ENCOUNTER — Other Ambulatory Visit: Payer: Medicaid Other

## 2014-10-27 DIAGNOSIS — R7989 Other specified abnormal findings of blood chemistry: Secondary | ICD-10-CM

## 2014-10-27 LAB — THYROID PANEL WITH TSH
FREE THYROXINE INDEX: 2 (ref 1.4–3.8)
T3 UPTAKE: 31 % (ref 22–35)
T4 TOTAL: 6.6 ug/dL (ref 4.5–12.0)
TSH: 0.379 u[IU]/mL (ref 0.350–4.500)

## 2014-11-24 ENCOUNTER — Ambulatory Visit: Payer: Medicaid Other | Admitting: Family Medicine

## 2014-12-17 ENCOUNTER — Ambulatory Visit: Payer: Medicaid Other | Admitting: Family Medicine

## 2015-01-06 ENCOUNTER — Encounter: Payer: Self-pay | Admitting: Family Medicine

## 2015-01-06 ENCOUNTER — Ambulatory Visit (INDEPENDENT_AMBULATORY_CARE_PROVIDER_SITE_OTHER): Payer: Medicaid Other | Admitting: Family Medicine

## 2015-01-06 VITALS — BP 106/69 | HR 69 | Temp 98.3°F | Ht 60.0 in | Wt 103.7 lb

## 2015-01-06 DIAGNOSIS — J309 Allergic rhinitis, unspecified: Secondary | ICD-10-CM | POA: Diagnosis present

## 2015-01-06 DIAGNOSIS — M25511 Pain in right shoulder: Secondary | ICD-10-CM | POA: Insufficient documentation

## 2015-01-06 DIAGNOSIS — M25519 Pain in unspecified shoulder: Secondary | ICD-10-CM | POA: Insufficient documentation

## 2015-01-06 DIAGNOSIS — Z0000 Encounter for general adult medical examination without abnormal findings: Secondary | ICD-10-CM | POA: Diagnosis not present

## 2015-01-06 DIAGNOSIS — M25512 Pain in left shoulder: Secondary | ICD-10-CM

## 2015-01-06 HISTORY — DX: Pain in right shoulder: M25.511

## 2015-01-06 HISTORY — DX: Pain in left shoulder: M25.512

## 2015-01-06 MED ORDER — FLUTICASONE PROPIONATE 50 MCG/ACT NA SUSP: 2.0000 | Freq: Every day | NASAL | Status: DC

## 2015-01-06 MED ORDER — IBUPROFEN 600 MG PO TABS: 600.0000 mg | ORAL_TABLET | Freq: Four times a day (QID) | ORAL | Status: DC | PRN

## 2015-01-06 MED ORDER — CETIRIZINE HCL 10 MG PO TABS: 10.0000 mg | ORAL_TABLET | Freq: Every day | ORAL | Status: DC

## 2015-01-06 NOTE — Assessment & Plan Note (Signed)
Reviewed HCM issues UTD on TDAP, pap smear

## 2015-01-06 NOTE — Patient Instructions (Signed)
It was nice to meet you today.  Start taking Zyrtec and flonase daily for allergies.  Start taking ibuprofen as needed for pain in your shoulder.  You can also use heat or ice if that makes it feel better.  I am referring you to sport's medicine.  Take care, Dr. Jacinto Reap

## 2015-01-06 NOTE — Progress Notes (Signed)
   Subjective:   Caroline Jensen is a 29 y.o. female with a history of allergic rhinitis here for establishing care.    Allergies:  - Never had them until after having kids - Stuffy nose, watery eyes, post nasal drip - Mostly occurs in spring, summer, fall - Never tried antihistamine - R ear pain x2 weeks  R shoulder pain:  - Been present couple months - Getting worse - Hurts worse with lifting overhead - No known injury - No h/o problems with this shoulder - Weaker in shoulder - No numbness/tingling in hands - No bruising/swelling, no pain to touch  Review of Systems:  Per HPI. All other systems reviewed and are negative.   PMH, PSH, Medications, Allergies, and FmHx reviewed and updated in EMR.  Social History: never smoker  Objective:  BP 106/69 mmHg  Pulse 69  Temp(Src) 98.3 F (36.8 C) (Oral)  Ht 5' (1.524 m)  Wt 103 lb 11.2 oz (47.038 kg)  BMI 20.25 kg/m2  LMP 12/24/2014  Gen:  29 y.o. female in NAD HEENT: NCAT, MMM, EOMI, PERRL, anicteric sclerae, TMs clear b/l CV: RRR, no MRG Resp: Non-labored, CTAB, no wheezes noted Ext: WWP, no edema MSK: R shoulder: abduction ROM decreased to about 90 degrees, slightly more with passive ROM.  No TTP, but pain with movement.  Limited internal rotation. Neuro: Alert and oriented, speech normal     Assessment:     Caroline Jensen is a 29 y.o. female here for establishing care, allergies, R shoulder pain.    Plan:     See problem list for problem-specific plans.   Virginia Crews, MD PGY-1,  [facility] 01/06/2015  3:28 PM

## 2015-01-06 NOTE — Assessment & Plan Note (Signed)
Suspect that pain is related to muscle strain Recommend ice/heat and ibuprofen Referral to Sport's medicine if doesn't improve with these things May benefit from steroid injection

## 2015-01-06 NOTE — Assessment & Plan Note (Signed)
Symptoms (including ear fullness) c/w allergic rhinitis Start Zyrtec and flonase daily

## 2015-01-08 NOTE — Progress Notes (Signed)
I was preceptor for this office visit.  

## 2015-01-15 ENCOUNTER — Ambulatory Visit: Payer: Medicaid Other | Admitting: Family Medicine

## 2015-01-22 ENCOUNTER — Ambulatory Visit: Payer: Medicaid Other | Admitting: Family Medicine

## 2015-01-25 ENCOUNTER — Ambulatory Visit (INDEPENDENT_AMBULATORY_CARE_PROVIDER_SITE_OTHER): Payer: Medicaid Other | Admitting: Family Medicine

## 2015-01-25 ENCOUNTER — Encounter: Payer: Self-pay | Admitting: Family Medicine

## 2015-01-25 VITALS — BP 103/71 | HR 66 | Ht 60.0 in | Wt 103.0 lb

## 2015-01-25 DIAGNOSIS — M25511 Pain in right shoulder: Secondary | ICD-10-CM | POA: Diagnosis present

## 2015-01-25 DIAGNOSIS — M25569 Pain in unspecified knee: Secondary | ICD-10-CM

## 2015-01-27 DIAGNOSIS — M25569 Pain in unspecified knee: Secondary | ICD-10-CM | POA: Insufficient documentation

## 2015-01-27 NOTE — Progress Notes (Signed)
   Subjective:    Patient ID: Caroline Jensen, female    DOB: April 22, 1986, 29 y.o.   MRN: [id number]  HPI right shoulder pain off and on for about the last 4-6 weeks. No specific injury. Pain with overhead motion and when she tries to reach behind herself such as to reach something in the back seat of the car. Also having some night pain if she tries to sleep on that side. No numbness or tingling in her hand or arm. She's not had any weakness of the arm. #2. Bilateral knee pain if she sits for long period of time. When she stands up the knees hurt for the first few minutes while she's walking. Then it resolves.  Review of Systems She's noted no unusual joint swelling, specifically no swelling of the right shoulder joint or either knee joint. No erythema or warmth of these joints. No fever, sweats, chills, unusual weight change.    Objective:   Physical Exam I'll signs are reviewed GEN.: Well-developed female no acute distress in SHOULDER: Right. Full range of motion all planes the rotator cuff with intact strength. She does have some mild pain with supraspinatus testing and lift off test. Vascular: Radial pulse 2+ bilaterally symmetrical. Dorsalis pedis pulse 2+ bilaterally symmetrical. KNEES: Bilaterally without any sign of erythema, no unusual warmth or rash noted of the skin. Symmetrical forward facing patella. Mild crepitus on the left. Very positive patellar grind test. Ligamentously intact to varus and valgus stress. Normal Lockman. Popliteal space is benign. Calf is soft. Distally neurovascularly intact.  ULTRASOUND: Right shoulder. Rotator cuff is seen in entirety. There is one small area of calcification in the supraspinatus tendon but otherwise it is intact without any sign of deformity. There is no increased Doppler activity. The before meals joint is without any sign of arthritis or excess fluid. The biceps tendon is normal in position without any sign of tenosynovitis.     Assessment &  Plan:  #1. I think she has a mild rotator cuff bursitis. We'll start her on home exercise program and see her back in 4 weeks or so. She's not improving, consider subacromial bursa injection at that time. We gave her handout, explained that in detail gave her an exercise band #2. Patellofemoral joint pain. She's extremely flexible. I will have her do some strengthening exercises specifically short arc quadriceps raise is and follow her up in 4 weeks. She has a tendency to sit on her legs with both knees flexed fully, this increases her pain I recommend she discontinue this practice. I gave her a handout on patellofemoral pain syndrome as well as the exercise handout for short arc quadriceps exercises.

## 2015-03-12 ENCOUNTER — Ambulatory Visit (INDEPENDENT_AMBULATORY_CARE_PROVIDER_SITE_OTHER): Payer: Medicaid Other | Admitting: Family Medicine

## 2015-03-12 ENCOUNTER — Encounter: Payer: Self-pay | Admitting: Family Medicine

## 2015-03-12 VITALS — BP 97/60 | HR 62 | Temp 98.7°F | Ht 62.0 in | Wt 104.0 lb

## 2015-03-12 DIAGNOSIS — R300 Dysuria: Secondary | ICD-10-CM

## 2015-03-12 DIAGNOSIS — L299 Pruritus, unspecified: Secondary | ICD-10-CM | POA: Diagnosis not present

## 2015-03-12 LAB — POCT URINALYSIS DIPSTICK
Bilirubin, UA: NEGATIVE
GLUCOSE UA: NEGATIVE
KETONES UA: NEGATIVE
Leukocytes, UA: NEGATIVE
NITRITE UA: NEGATIVE
Protein, UA: NEGATIVE
SPEC GRAV UA: 1.025
Urobilinogen, UA: 1
pH, UA: 7

## 2015-03-12 LAB — POCT UA - MICROSCOPIC ONLY

## 2015-03-12 MED ORDER — CEPHALEXIN 500 MG PO CAPS: 500.0000 mg | ORAL_CAPSULE | Freq: Four times a day (QID) | ORAL | Status: AC

## 2015-03-12 NOTE — Progress Notes (Signed)
  [id number] Caroline Romp, MD Chief Complaint  Patient presents with  . Urinary Tract Infection    Current Issues:  Presents with a few months history of dysuria and hesistancy. She also has vaginal itching and dryness. She states she was seen at [facility] and was prescribed hydrocortisone cream for her itching and nothing for her urinary symptoms secondary to her urinalysis not being suggestive of an infection. The patient states that her pubic area appears dry and cracked. No lesions noted. Has a history of HPV.    There is no history of of similar symptoms. Sexually active:  Yes with female.   No concern for STI.  Prior to Admission medications   Medication Sig Start Date End Date Taking? Authorizing Provider  cetirizine (ZYRTEC) 10 MG tablet Take 1 tablet (10 mg total) by mouth daily. 01/06/15   Virginia Crews, MD  fluconazole (DIFLUCAN) 150 MG tablet  10/19/14   Historical Provider, MD  fluticasone (FLONASE) 50 MCG/ACT nasal spray Place 2 sprays into both nostrils daily. 01/06/15   Virginia Crews, MD  hydrocortisone cream 1 % Apply to affected area 2 times daily 10/16/14 10/16/15  Rachelle A Denney, CNM  ibuprofen (ADVIL,MOTRIN) 600 MG tablet Take 1 tablet (600 mg total) by mouth every 6 (six) hours as needed for mild pain. 01/06/15   Virginia Crews, MD    Review of Systems:No hematuria, no vaginal bleeding or discharge. No vaginal itching. No vaginal odors.  PE:  BP 97/60 mmHg  Pulse 62  Temp(Src) 98.7 F (37.1 C) (Oral)  Ht 5\' 2"  (1.575 m)  Wt 104 lb (47.174 kg)  BMI 19.02 kg/m2 Constitutional: Well appearing, no distress Skin: Pubic area with short trimmed curly hair. No lesions noted on exam with no visible organisms or eggs. Skin in the area does not look dry or flaky  Results for orders placed or performed in visit on 10/27/14  Thyroid Panel With TSH  Result Value Ref Range   T4, Total 6.6 4.5 - 12.0 ug/dL   T3 Uptake 31 22 - 35 %   Free Thyroxine Index  2.0 1.4 - 3.8   TSH 0.379 0.350 - 4.500 uIU/mL    Assessment and Plan:   1. Dysuria: if negative for UTI, patient will need follow-up pelvic exam 2. Itching, pubis: unsure of cause. Nothing visible on exam. No associated symptoms - POCT urinalysis dipstick - Urine culture - keflex 500mg  QID x7 days, discontinue if culture negative - discussed keeping skin clean, moisturized and not keeping hair short - Return if symptoms persist

## 2015-03-12 NOTE — Patient Instructions (Signed)
Thank you for coming to see me today. It was a pleasure. Today we talked about:   Painful urination: I am going to treat you as having a urinary tract infection even though your urine did not suggest such. I will get a urine culture to confirm. If  Negative, you can stop antibiotics  Itching: I could not find a reason for this itching. I would keep the area dry and moisturized along with minimizing shaving.  If you have any questions or concerns, please do not hesitate to call the office at [phone].  Sincerely,  Cordelia Poche, MD

## 2015-03-13 LAB — URINE CULTURE
Colony Count: NO GROWTH
Organism ID, Bacteria: NO GROWTH

## 2015-03-15 ENCOUNTER — Telehealth: Payer: Self-pay | Admitting: *Deleted

## 2015-03-15 DIAGNOSIS — L299 Pruritus, unspecified: Secondary | ICD-10-CM

## 2015-03-15 NOTE — Telephone Encounter (Signed)
Pt informed and agreeable.  She is requesting a referral to a specialist about the itching and peeling in there pubic area.  Advised I would send message to MD. Clinton Sawyer, Salome Spotted

## 2015-03-15 NOTE — Telephone Encounter (Signed)
-----   Message from Mariel Aloe, MD sent at 03/14/2015  6:20 PM EDT ----- Urine culture negative. Patent can discontinue Keflex.

## 2015-03-16 NOTE — Telephone Encounter (Signed)
LMOV for pt to call office to advise a referral to a dermatologist has been given to our referral coordinator. Ottis Stain, CMA

## 2015-03-19 ENCOUNTER — Encounter: Payer: Self-pay | Admitting: Family Medicine

## 2015-03-19 ENCOUNTER — Ambulatory Visit (INDEPENDENT_AMBULATORY_CARE_PROVIDER_SITE_OTHER): Payer: Medicaid Other | Admitting: Family Medicine

## 2015-03-19 VITALS — BP 101/66 | Ht 63.0 in | Wt 103.0 lb

## 2015-03-19 DIAGNOSIS — M25511 Pain in right shoulder: Secondary | ICD-10-CM | POA: Diagnosis not present

## 2015-03-19 MED ORDER — METHYLPREDNISOLONE ACETATE 40 MG/ML IJ SUSP
40.0000 mg | Freq: Once | INTRAMUSCULAR | Status: AC
  Administered 2015-03-19: 40 mg via INTRA_ARTICULAR

## 2015-03-23 NOTE — Progress Notes (Signed)
   Subjective:    Patient ID: Caroline Jensen, female    DOB: 1985-09-12, 29 y.o.   MRN: [id number]  HPI F/u Right shoulder pain. We started her on HEP for rotator cuff syndrome--she says exercises mad it much worse so sheonl;y did them a day otr two. Now sheis having similar symptoms in left shulder. Feels very frustrated.   Review of Systems No new arthralgiuas except for left shoulder per HPI. Pertinent review of systems: negative for fever or unusual weight change.     Objective:   Physical Exam  GEN WD WN NAD SHOUDLRS: Bilaterally symmetrical. Very flexible but no subluxation and negative apprehension sign bilaterally. RIGT shoulder: pain with supraspinatus testing but otherwise painless exam with full strength in all planes of rotator cuff. LEFT shuolder exam normal strength, mild pain with supraspinatus testing and lift off test. NUERO: C4-5-6-7 normal bilaterally. Intact sensation to soft touch both hands   INJECTION: Patient was given informed consent, signed copy in the chart. Appropriate time out was taken. Area prepped and draped in usual sterile fashion. 1 cc of methylprednisolone 40 mg/ml plus  4 cc of 1% lidocaine without epinephrine was injected into the right subacromialbursa using a(n) posterior approach. The patient tolerated the procedure well. There were no complications. Post procedure instructions were given.     Assessment & Plan:  Initially RIGHt but now bilateral shoulder pain. Exam is not very revealing. No new activities, no changes in job etc that would account for new onset shoulder pain. We discussed at length. She is frustrated. We ultimately decided to try CSI into subacromial bursa on right and have her f/u in one week. She does NOT want to do  more HEP. If she has no improvement, unclear what we can offer her.Greater than 50% of our 45 minute office visit was spent in counseling and education regarding these issues.

## 2015-03-29 ENCOUNTER — Ambulatory Visit: Payer: Medicaid Other | Admitting: Family Medicine

## 2015-07-12 ENCOUNTER — Encounter (HOSPITAL_COMMUNITY): Payer: Self-pay | Admitting: Emergency Medicine

## 2015-07-12 ENCOUNTER — Emergency Department (INDEPENDENT_AMBULATORY_CARE_PROVIDER_SITE_OTHER)
Admission: EM | Admit: 2015-07-12 | Discharge: 2015-07-12 | Disposition: A | Discharge status: Home / Self Care | Payer: Medicaid Other | Source: Home / Self Care

## 2015-07-12 DIAGNOSIS — J029 Acute pharyngitis, unspecified: Secondary | ICD-10-CM | POA: Diagnosis not present

## 2015-07-12 LAB — POCT RAPID STREP A: STREPTOCOCCUS, GROUP A SCREEN (DIRECT): POSITIVE — AB

## 2015-07-12 MED ORDER — ACETAMINOPHEN 325 MG PO TABS
650.0000 mg | ORAL_TABLET | Freq: Once | ORAL | Status: AC
  Administered 2015-07-12: 650 mg via ORAL

## 2015-07-12 MED ORDER — AMOXICILLIN 250 MG/5ML PO SUSR: ORAL | Status: DC

## 2015-07-12 MED ORDER — IBUPROFEN 800 MG PO TABS
ORAL_TABLET | ORAL | Status: AC
  Filled 2015-07-12: qty 1

## 2015-07-12 MED ORDER — ACETAMINOPHEN 325 MG PO TABS
ORAL_TABLET | ORAL | Status: AC
  Filled 2015-07-12: qty 2

## 2015-07-12 MED ORDER — IBUPROFEN 800 MG PO TABS
800.0000 mg | ORAL_TABLET | Freq: Once | ORAL | Status: AC
  Administered 2015-07-12: 800 mg via ORAL

## 2015-07-12 NOTE — Discharge Instructions (Signed)

## 2015-07-12 NOTE — ED Notes (Signed)
Pt woke at 0300 this morning with chills, fever, sore throat, right ear ache, generalized body aches, headache and nausea with the need for urination.  Pt has taken Tylenol twice today.

## 2015-07-12 NOTE — ED Provider Notes (Signed)
CSN: [id number]     Arrival date & time 07/12/15  1901 History   None    Chief Complaint  Patient presents with  . Sore Throat  . Otalgia  . Generalized Body Aches  . Nausea  . Fever   (Consider location/radiation/quality/duration/timing/severity/associated sxs/prior Treatment) Patient is a 29 y.o. female presenting with pharyngitis, ear pain, and fever. The history is provided by the patient.  Sore Throat This is a new problem. The current episode started 12 to 24 hours ago. The problem occurs constantly. The problem has not changed since onset.Nothing aggravates the symptoms. Nothing relieves the symptoms. She has tried acetaminophen for the symptoms.  Otalgia Associated symptoms: fever   Fever Associated symptoms: ear pain     Past Medical History  Diagnosis Date  . Genital warts    Past Surgical History  Procedure Laterality Date  . Laser ablation of condylomas     Family History  Problem Relation Age of Onset  . Cancer Paternal Aunt     leukemia  . Hypertension Maternal Aunt   . Diabetes Neg Hx   . Heart disease Neg Hx   . Stroke      unknown  . Drug abuse Father    Social History  Substance Use Topics  . Smoking status: Never Smoker   . Smokeless tobacco: Never Used  . Alcohol Use: No   OB History    Gravida Para Term Preterm AB TAB SAB Ectopic Multiple Living   3 3 2 1            Review of Systems  Constitutional: Positive for fever.  HENT: Positive for ear pain.   Eyes: Negative.   Respiratory: Negative.   Cardiovascular: Negative.   Gastrointestinal: Negative.   Endocrine: Negative.   Genitourinary: Negative.   Musculoskeletal: Negative.   Skin: Negative.   Allergic/Immunologic: Negative.   Neurological: Negative.   Hematological: Negative.   Psychiatric/Behavioral: Negative.     Allergies  Review of patient's allergies indicates no known allergies.  Home Medications   Prior to Admission medications   Medication Sig Start Date End Date  Taking? Authorizing Provider  amoxicillin (AMOXIL) 250 MG/5ML suspension Take 10 ml po qid x 7 days 07/12/15   Lysbeth Penner, FNP  cetirizine (ZYRTEC) 10 MG tablet Take 1 tablet (10 mg total) by mouth daily. 01/06/15   Virginia Crews, MD  fluconazole (DIFLUCAN) 150 MG tablet  10/19/14   Historical Provider, MD  fluticasone (FLONASE) 50 MCG/ACT nasal spray Place 2 sprays into both nostrils daily. 01/06/15   Virginia Crews, MD  hydrocortisone cream 1 % Apply to affected area 2 times daily 10/16/14 10/16/15  Rachelle A Denney, CNM  ibuprofen (ADVIL,MOTRIN) 600 MG tablet Take 1 tablet (600 mg total) by mouth every 6 (six) hours as needed for mild pain. 01/06/15   Virginia Crews, MD   Meds Ordered and Administered this Visit   Medications  acetaminophen (TYLENOL) tablet 650 mg (not administered)  ibuprofen (ADVIL,MOTRIN) tablet 800 mg (not administered)    BP 108/66 mmHg  Pulse 97  Temp(Src) 101.9 F (38.8 C) (Oral)  SpO2 100% No data found.   Physical Exam  Constitutional: She is oriented to person, place, and time. She appears well-developed and well-nourished.  HENT:  Head: Normocephalic and atraumatic.  Right Ear: External ear normal.  Left Ear: External ear normal.  Mouth/Throat: Oropharyngeal exudate present.  Oropharanx injected with erythematous petechia  Eyes: Conjunctivae and EOM are normal. Pupils are equal,  round, and reactive to light.  Neck: Normal range of motion. Neck supple.  Cardiovascular: Normal rate, regular rhythm and normal heart sounds.   Pulmonary/Chest: Effort normal and breath sounds normal.  Abdominal: Soft. Bowel sounds are normal.  Neurological: She is alert and oriented to person, place, and time.    ED Course  Procedures (including critical care time)  Labs Review Labs Reviewed  POCT RAPID STREP A - Abnormal; Notable for the following:    Streptococcus, Group A Screen (Direct) POSITIVE (*)    All other components within normal limits     Imaging Review No results found.   Visual Acuity Review  Right Eye Distance:   Left Eye Distance:   Bilateral Distance:    Right Eye Near:   Left Eye Near:    Bilateral Near:         MDM   1. Pharyngitis    Tylenol 650mg  now Motrin 800mg  now Amoxicillin 250mg  / 74ml 10 cc qid x 7 days #336ml  WSWG's Take motrin and tylenol otc as directed.  [place], FNP 07/12/15 2025

## 2016-01-17 ENCOUNTER — Other Ambulatory Visit (HOSPITAL_COMMUNITY)
Admission: RE | Admit: 2016-01-17 | Discharge: 2016-01-17 | Disposition: A | Discharge status: Home / Self Care | Payer: Medicaid Other | Source: Ambulatory Visit | Attending: Family Medicine | Admitting: Family Medicine

## 2016-01-17 ENCOUNTER — Ambulatory Visit (INDEPENDENT_AMBULATORY_CARE_PROVIDER_SITE_OTHER): Payer: Medicaid Other | Admitting: Family Medicine

## 2016-01-17 ENCOUNTER — Encounter: Payer: Self-pay | Admitting: Family Medicine

## 2016-01-17 VITALS — BP 102/61 | HR 66 | Temp 98.4°F | Ht 62.0 in | Wt 106.0 lb

## 2016-01-17 DIAGNOSIS — N898 Other specified noninflammatory disorders of vagina: Secondary | ICD-10-CM

## 2016-01-17 DIAGNOSIS — N9089 Other specified noninflammatory disorders of vulva and perineum: Secondary | ICD-10-CM | POA: Diagnosis not present

## 2016-01-17 DIAGNOSIS — N760 Acute vaginitis: Secondary | ICD-10-CM | POA: Insufficient documentation

## 2016-01-17 DIAGNOSIS — Z0000 Encounter for general adult medical examination without abnormal findings: Secondary | ICD-10-CM

## 2016-01-17 DIAGNOSIS — R300 Dysuria: Secondary | ICD-10-CM | POA: Diagnosis present

## 2016-01-17 DIAGNOSIS — Z113 Encounter for screening for infections with a predominantly sexual mode of transmission: Secondary | ICD-10-CM | POA: Insufficient documentation

## 2016-01-17 DIAGNOSIS — Z124 Encounter for screening for malignant neoplasm of cervix: Secondary | ICD-10-CM

## 2016-01-17 LAB — POCT UA - MICROSCOPIC ONLY

## 2016-01-17 LAB — POCT URINALYSIS DIPSTICK
BILIRUBIN UA: NEGATIVE
GLUCOSE UA: NEGATIVE
KETONES UA: NEGATIVE
Leukocytes, UA: NEGATIVE
NITRITE UA: NEGATIVE
Protein, UA: NEGATIVE
Spec Grav, UA: 1.02
Urobilinogen, UA: 0.2
pH, UA: 6

## 2016-01-17 LAB — POCT WET PREP (WET MOUNT): CLUE CELLS WET PREP WHIFF POC: NEGATIVE

## 2016-01-17 LAB — POCT HEMOGLOBIN: Hemoglobin: 12.7 g/dL (ref 12.2–16.2)

## 2016-01-17 MED ORDER — NYSTATIN 100000 UNIT/GM EX OINT: 1.0000 "application " | TOPICAL_OINTMENT | Freq: Two times a day (BID) | CUTANEOUS | Status: DC

## 2016-01-17 MED ORDER — TUBERCULIN PPD 5 UNIT/0.1ML ID SOLN
5.0000 [IU] | Freq: Once | INTRADERMAL | Status: DC
  Administered 2016-01-17: 5 [IU] via INTRADERMAL

## 2016-01-17 NOTE — Assessment & Plan Note (Signed)
Exam c/w cutaneous yeast infection Nystatin cream BID F/u prn

## 2016-01-17 NOTE — Assessment & Plan Note (Signed)
UA wnl Suspect related to vulvar irritation

## 2016-01-17 NOTE — Assessment & Plan Note (Signed)
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

## 2016-01-17 NOTE — Progress Notes (Signed)
   Subjective:   Caroline Jensen is a 30 y.o. female with a history of allergic rhinitis here for well woman/preventative visit and annual GYN examination  Acute Concerns:  Itching in pubic areas - going on for months - ob/gyn looked previously and said eveything was fine - raw after sratchign - no rash, just redness - did have redness between breasts that was similar previously  Bite on butt  - itching - occurred Friday -wants it looked at - no drainage or fevers - bump went down  Dysuria - no fevers - dysuria (mostly at the end of urination on labia), frequency - ongoing for months - was previously checked for UTI - negative - normal vaginal discharge present  Diet: "not the best", banana for breakfast, gets busy and doesn't eat sometimes  Exercise: walking around campus at school, takes stairs regularly  Sexual/Birth History: not sexually active for 1.53yrs, female partners previously, [id number]  Birth Control: none  POA/Living Will: none   Review of Systems: Per HPI.   PMH, PSH, Medications, Allergies, and FmHx reviewed and updated in EMR.  Social History: never smoker  Immunization:  Tdap/TD: 09/2012  Influenza: n/a  Pneumococcal: n/a  Herpes Zoster: n/a  Cancer Screening:  Pap Smear: 10/2014 - normal, but prev HPV +  Mammogram: n/a  Colonoscopy: n/a  Dexa: n/a   Objective:  BP 102/61 mmHg  Pulse 66  Temp(Src) 98.4 F (36.9 C) (Oral)  Ht 5\' 2"  (1.575 m)  Wt 106 lb (48.081 kg)  BMI 19.38 kg/m2  SpO2 100%  LMP 01/12/2016  Gen:  30 y.o. female in NAD HEENT: NCAT, MMM, EOMI, PERRL, anicteric sclerae, OP clear, TMs clear b/l CV: RRR, no MRG Resp: Non-labored, CTAB, no wheezes noted Abd: Soft, NTND, BS present, no guarding or organomegaly Ext: WWP, no edema Gyn: External genitalia with redness and irritation, no rash.  Vaginal mucosa pink, moist, normal rugae.  Nonfriable cervix without lesions, no discharge or bleeding noted on speculum exam.   Bimanual exam revealed normal, nongravid uterus.  No cervical motion tenderness. No adnexal masses bilaterally.   MSK: Full ROM, strength intact Neuro: Alert and oriented, speech normal      Assessment & Plan:     Caroline Jensen is a 30 y.o. female here for annual well woman/preventative exam and GYN exam.  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

## 2016-01-17 NOTE — Addendum Note (Signed)
Addended by: Dorna Bloom on: 01/17/2016 12:06 PM   Modules accepted: Orders, SmartSet

## 2016-01-17 NOTE — Addendum Note (Signed)
Addended by: [place] Pink on: 01/17/2016 12:11 PM   Modules accepted: Orders, SmartSet

## 2016-01-17 NOTE — Assessment & Plan Note (Signed)
No abnormal discharge noted on exam GC/CT, wet prep collected

## 2016-01-17 NOTE — Patient Instructions (Signed)
Things to do to keep yourself healthy  - Exercise at least 30-45 minutes a day, 3-4 days a week.  - Eat a low-fat diet with lots of fruits and vegetables, up to 7-9 servings per day.  - Seatbelts can save your life. Wear them always.  - Smoke detectors on every level of your home, check batteries every year.  - Eye Doctor - have an eye exam every 1-2 years  - Safe sex - if you may be exposed to STDs, use a condom.  - Alcohol -  If you drink, do it moderately, less than 2 drinks per day.  - [place]. Choose someone to speak for you if you are not able.  - Depression is common in our stressful world.If you're feeling down or losing interest in things you normally enjoy, please come in for a visit.  - Violence - If anyone is threatening or hurting you, please call immediately.    Cutaneous Candidiasis Cutaneous candidiasis is a condition in which there is an overgrowth of yeast (candida) on the skin. Yeast normally live on the skin, but in small enough numbers not to cause any symptoms. In certain cases, increased growth of the yeast may cause an actual yeast infection. This kind of infection usually occurs in areas of the skin that are constantly warm and moist, such as the armpits or the groin. Yeast is the most common cause of diaper rash in babies and in people who cannot control their bowel movements (incontinence). CAUSES  The fungus that most often causes cutaneous candidiasis is Candida albicans. Conditions that can increase the risk of getting a yeast infection of the skin include:  Obesity.  Pregnancy.  Diabetes.  Taking antibiotic medicine.  Taking birth control pills.  Taking steroid medicines.  Thyroid disease.  An iron or zinc deficiency.  Problems with the immune system. SYMPTOMS   Red, swollen area of the skin.  Bumps on the skin.  Itchiness. DIAGNOSIS  The diagnosis of cutaneous candidiasis is usually based on its appearance. Light  scrapings of the skin may also be taken and viewed under a microscope to identify the presence of yeast. TREATMENT  Antifungal creams may be applied to the infected skin. In severe cases, oral medicines may be needed.  HOME CARE INSTRUCTIONS   Keep your skin clean and dry.  Maintain a healthy weight.  If you have diabetes, keep your blood sugar under control. SEEK IMMEDIATE MEDICAL CARE IF:  Your rash continues to spread despite treatment.  You have a fever, chills, or abdominal pain.   This information is not intended to replace advice given to you by your health care provider. Make sure you discuss any questions you have with your health care provider.   Document Released: 05/09/2011 Document Revised: 11/13/2011 Document Reviewed: 02/22/2015 Elsevier Interactive Patient Education [employer].

## 2016-01-19 ENCOUNTER — Ambulatory Visit (INDEPENDENT_AMBULATORY_CARE_PROVIDER_SITE_OTHER): Payer: Self-pay | Admitting: *Deleted

## 2016-01-19 ENCOUNTER — Encounter: Payer: Self-pay | Admitting: Family Medicine

## 2016-01-19 ENCOUNTER — Encounter: Payer: Self-pay | Admitting: *Deleted

## 2016-01-19 DIAGNOSIS — Z111 Encounter for screening for respiratory tuberculosis: Secondary | ICD-10-CM

## 2016-01-19 DIAGNOSIS — Z7689 Persons encountering health services in other specified circumstances: Secondary | ICD-10-CM

## 2016-01-19 LAB — CERVICOVAGINAL ANCILLARY ONLY
Chlamydia: NEGATIVE
HPV: NOT DETECTED
NEISSERIA GONORRHEA: NEGATIVE

## 2016-01-19 NOTE — Progress Notes (Signed)
   PPD Reading Note PPD read and results entered in [facility]. Result: 0 mm induration. Interpretation: Negative If test not read within 48-72 hours of initial placement, patient advised to repeat in other arm 1-3 weeks after this test. Allergic reaction: no  Nekoda Chock L, RN  

## 2016-01-20 ENCOUNTER — Other Ambulatory Visit (HOSPITAL_COMMUNITY)
Admission: RE | Admit: 2016-01-20 | Discharge: 2016-01-20 | Disposition: A | Discharge status: Home / Self Care | Payer: Medicaid Other | Source: Ambulatory Visit | Attending: Family Medicine | Admitting: Family Medicine

## 2016-01-20 DIAGNOSIS — Z01419 Encounter for gynecological examination (general) (routine) without abnormal findings: Secondary | ICD-10-CM | POA: Diagnosis present

## 2016-01-20 NOTE — Addendum Note (Signed)
Addended by: Levert Feinstein F on: 01/20/2016 11:19 AM   Modules accepted: Orders, SmartSet

## 2016-01-21 LAB — CYTOLOGY - PAP

## 2016-01-26 ENCOUNTER — Ambulatory Visit (INDEPENDENT_AMBULATORY_CARE_PROVIDER_SITE_OTHER): Payer: Medicaid Other | Admitting: *Deleted

## 2016-01-26 DIAGNOSIS — Z111 Encounter for screening for respiratory tuberculosis: Secondary | ICD-10-CM | POA: Diagnosis present

## 2016-01-26 NOTE — Progress Notes (Signed)
   PPD placed Left Forearm.  Pt to return 01/28/2016 for reading.  Pt tolerated intradermal injection. Derl Barrow, RN

## 2016-01-28 ENCOUNTER — Ambulatory Visit (INDEPENDENT_AMBULATORY_CARE_PROVIDER_SITE_OTHER): Payer: Medicaid Other | Admitting: *Deleted

## 2016-01-28 DIAGNOSIS — Z7689 Persons encountering health services in other specified circumstances: Secondary | ICD-10-CM

## 2016-01-28 DIAGNOSIS — Z111 Encounter for screening for respiratory tuberculosis: Secondary | ICD-10-CM

## 2016-01-28 DIAGNOSIS — Z0184 Encounter for antibody response examination: Secondary | ICD-10-CM

## 2016-01-28 LAB — TB SKIN TEST
INDURATION: 0 mm
TB SKIN TEST: NEGATIVE

## 2016-01-28 NOTE — Progress Notes (Signed)
    PPD Reading Note PPD read and results entered in [name]. Result: 0 mm induration. Interpretation: Negative If test not read within 48-72 hours of initial placement, patient advised to repeat in other arm 1-3 weeks after this test. 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  Patient need a varicella vaccine or titer completed.  Patient stated that she had chicken pox as a child.  Varicella titer ordered.  Will call patient with results.    Caroline Barrow, RN

## 2016-02-01 LAB — VARICELLA ZOSTER ANTIBODY, IGG: Varicella IgG: 1496 Index — ABNORMAL HIGH (ref <135.00)

## 2016-02-03 ENCOUNTER — Encounter: Payer: Self-pay | Admitting: Family Medicine

## 2016-03-13 ENCOUNTER — Encounter: Payer: Self-pay | Admitting: Family Medicine

## 2016-03-13 ENCOUNTER — Ambulatory Visit (INDEPENDENT_AMBULATORY_CARE_PROVIDER_SITE_OTHER): Payer: Medicaid Other | Admitting: Family Medicine

## 2016-03-13 VITALS — BP 103/57 | HR 64 | Temp 98.4°F | Ht 63.0 in | Wt 106.6 lb

## 2016-03-13 DIAGNOSIS — N9089 Other specified noninflammatory disorders of vulva and perineum: Secondary | ICD-10-CM

## 2016-03-13 MED ORDER — HYDROCORTISONE 1 % EX OINT: 1.0000 "application " | TOPICAL_OINTMENT | Freq: Two times a day (BID) | CUTANEOUS | Status: DC

## 2016-03-13 NOTE — Assessment & Plan Note (Signed)
Exam c/w with likely contact vs eczematous dermatitis D/c nystatin Start hydrocortisone BID and frequent moisturizer Eczema precautions discussed F/u prn

## 2016-03-13 NOTE — Progress Notes (Signed)
   Subjective:   Caroline Jensen is a 30 y.o. female with a history of allergic rhinitis and vulvar irritation here for same day appt for vulvar rash  Patient was previously seen On 01/17/16 for over irritation. Thought to be cutaneous yeast infection at that time and patient was prescribed nystatin. She reports nystatin did not work and rash continues to be pruritic. Redness has now gone away, but some areas of the rash have darkened as a healed. She denies any raised bumps. She tried some mild hydrocortisone cream and Shea butter which seemed to help a little bit. She denies any recent changes in soaps or detergents and uses unscented and sensitive skin soaps and detergents.  Review of Systems:  Per HPI.   Social History: never smoker  Objective:  BP 103/57 mmHg  Pulse 64  Temp(Src) 98.4 F (36.9 C) (Oral)  Ht 5\' 3"  (1.6 m)  Wt 106 lb 9.6 oz (48.353 kg)  BMI 18.89 kg/m2  Gen:  30 y.o. female in NAD HEENT: NCAT, MMM, anicteric sclerae CV: RRR, no MRG Resp: Non-labored, CTAB, no wheezes noted Abd: Soft, NTND, BS present, no guarding or organomegaly GYN:  External genitalia with darkened rash over mons pubis.  No raised areas or erythema.   Ext: WWP, no edema Neuro: Alert and oriented, speech normal    Assessment & Plan:     [name] is a 30 y.o. female here for   Vulvar irritation Exam c/w with likely contact vs eczematous dermatitis D/c nystatin Start hydrocortisone BID and frequent moisturizer Eczema precautions discussed F/u prn    Virginia Crews, MD MPH PGY-3,  [facility] 03/13/2016  8:59 PM

## 2016-03-13 NOTE — Patient Instructions (Signed)
Eczema Eczema, also called atopic dermatitis, is a skin disorder that causes inflammation of the skin. It causes a red rash and dry, scaly skin. The skin becomes very itchy. Eczema is generally worse during the cooler winter months and often improves with the warmth of summer. Eczema usually starts showing signs in infancy. Some children outgrow eczema, but it may last through adulthood.  CAUSES  The exact cause of eczema is not known, but it appears to run in families. People with eczema often have a family history of eczema, allergies, asthma, or hay fever. Eczema is not contagious. Flare-ups of the condition may be caused by:   Contact with something you are sensitive or allergic to.   Stress. SIGNS AND SYMPTOMS  Dry, scaly skin.   Red, itchy rash.   Itchiness. This may occur before the skin rash and may be very intense.  DIAGNOSIS  The diagnosis of eczema is usually made based on symptoms and medical history. TREATMENT  Eczema cannot be cured, but symptoms usually can be controlled with treatment and other strategies. A treatment plan might include:  Controlling the itching and scratching.   Use over-the-counter antihistamines as directed for itching. This is especially useful at night when the itching tends to be worse.   Use over-the-counter steroid creams as directed for itching.   Avoid scratching. Scratching makes the rash and itching worse. It may also result in a skin infection (impetigo) due to a break in the skin caused by scratching.   Keeping the skin well moisturized with creams every day. This will seal in moisture and help prevent dryness. Lotions that contain alcohol and water should be avoided because they can dry the skin.   Limiting exposure to things that you are sensitive or allergic to (allergens).   Recognizing situations that cause stress.   Developing a plan to manage stress.  HOME CARE INSTRUCTIONS   Only take over-the-counter or  prescription medicines as directed by your health care provider.   Do not use anything on the skin without checking with your health care provider.   Keep baths or showers short (5 minutes) in warm (not hot) water. Use mild cleansers for bathing. These should be unscented. You may add nonperfumed bath oil to the bath water. It is best to avoid soap and bubble bath.   Immediately after a bath or shower, when the skin is still damp, apply a moisturizing ointment to the entire body. This ointment should be a petroleum ointment. This will seal in moisture and help prevent dryness. The thicker the ointment, the better. These should be unscented.   Keep fingernails cut short. Children with eczema may need to wear soft gloves or mittens at night after applying an ointment.   Dress in clothes made of cotton or cotton blends. Dress lightly, because heat increases itching.   A child with eczema should stay away from anyone with fever blisters or cold sores. The virus that causes fever blisters (herpes simplex) can cause a serious skin infection in children with eczema. SEEK MEDICAL CARE IF:   Your itching interferes with sleep.   Your rash gets worse or is not better within 1 week after starting treatment.   You see pus or soft yellow scabs in the rash area.   You have a fever.   You have a rash flare-up after contact with someone who has fever blisters.    This information is not intended to replace advice given to you by your health care   provider. Make sure you discuss any questions you have with your health care provider.   Document Released: 08/18/2000 Document Revised: 06/11/2013 Document Reviewed: 03/24/2013 Elsevier Interactive Patient Education 2016 Elsevier Inc.  

## 2016-03-17 ENCOUNTER — Telehealth: Payer: Self-pay | Admitting: *Deleted

## 2016-03-17 MED ORDER — DESONIDE 0.05 % EX CREA: TOPICAL_CREAM | Freq: Two times a day (BID) | CUTANEOUS | Status: DC

## 2016-03-17 NOTE — Telephone Encounter (Signed)
New Rx at pharmacy.  Please let patient know. Thanks!  Virginia Crews, MD, MPH PGY-3,  [facility] 03/17/2016 1:40 PM

## 2016-03-17 NOTE — Telephone Encounter (Signed)
Pt informed. Deseree Blount, CMA  

## 2016-03-17 NOTE — Telephone Encounter (Signed)
Pt calling in stating that the dr sent the wrong dosage in for her hydrocortisone cream, she already uses 2.5 and you told her that you were going to send something stronger in. Please advise. Kathlynn Swofford Kennon Holter, CMA

## 2016-11-20 ENCOUNTER — Encounter (HOSPITAL_COMMUNITY): Payer: Self-pay | Admitting: *Deleted

## 2016-11-20 ENCOUNTER — Ambulatory Visit (HOSPITAL_COMMUNITY)
Admission: EM | Admit: 2016-11-20 | Discharge: 2016-11-20 | Disposition: A | Discharge status: Home / Self Care | Payer: Medicaid Other | Attending: Family Medicine | Admitting: Family Medicine

## 2016-11-20 DIAGNOSIS — R21 Rash and other nonspecific skin eruption: Secondary | ICD-10-CM | POA: Diagnosis present

## 2016-11-20 DIAGNOSIS — N390 Urinary tract infection, site not specified: Secondary | ICD-10-CM | POA: Insufficient documentation

## 2016-11-20 DIAGNOSIS — N898 Other specified noninflammatory disorders of vagina: Secondary | ICD-10-CM | POA: Insufficient documentation

## 2016-11-20 DIAGNOSIS — R350 Frequency of micturition: Secondary | ICD-10-CM | POA: Diagnosis present

## 2016-11-20 LAB — POCT URINALYSIS DIP (DEVICE)
BILIRUBIN URINE: NEGATIVE
Glucose, UA: NEGATIVE mg/dL
Hgb urine dipstick: NEGATIVE
KETONES UR: NEGATIVE mg/dL
Nitrite: NEGATIVE
PH: 7 (ref 5.0–8.0)
PROTEIN: NEGATIVE mg/dL
SPECIFIC GRAVITY, URINE: 1.01 (ref 1.005–1.030)
Urobilinogen, UA: 0.2 mg/dL (ref 0.0–1.0)

## 2016-11-20 MED ORDER — HYDROXYZINE HCL 25 MG PO TABS: 25.0000 mg | ORAL_TABLET | Freq: Four times a day (QID) | ORAL | 0 refills | Status: DC

## 2016-11-20 MED ORDER — CEPHALEXIN 500 MG PO CAPS: 500.0000 mg | ORAL_CAPSULE | Freq: Four times a day (QID) | ORAL | 0 refills | Status: DC

## 2016-11-20 MED ORDER — METHYLPREDNISOLONE 4 MG PO TBPK: ORAL_TABLET | ORAL | 0 refills | Status: DC

## 2016-11-20 MED ORDER — PHENAZOPYRIDINE HCL 200 MG PO TABS: 200.0000 mg | ORAL_TABLET | Freq: Three times a day (TID) | ORAL | 0 refills | Status: DC

## 2016-11-20 NOTE — ED Triage Notes (Signed)
Patient reports vaginal discharge, abdominal pain and polyuria.   Rash to right breast.

## 2016-11-20 NOTE — ED Provider Notes (Signed)
CSN: [id number]     Arrival date & time 11/20/16  1320 History   None    Chief Complaint  Patient presents with  . Polyuria  . Abdominal Pain  . Vaginal Discharge  . Rash   (Consider location/radiation/quality/duration/timing/severity/associated sxs/prior Treatment) Patient c/o urinary frequency and lower abdominal discomfort.  She c/o right breast rash.  She states she has hx of eczema and she used a tube of hydrocortisone cream which helped with the itching but the rash remains.   The history is provided by the patient.  Abdominal Pain  Pain location:  Suprapubic Pain quality: aching   Pain radiates to:  Does not radiate Pain severity:  Mild Onset quality:  Sudden Duration:  1 day Timing:  Constant Progression:  Waxing and waning Chronicity:  New Relieved by:  Nothing Worsened by:  Nothing Ineffective treatments:  None tried Associated symptoms: vaginal discharge   Vaginal Discharge  Associated symptoms: abdominal pain   Rash  Associated symptoms: abdominal pain     Past Medical History:  Diagnosis Date  . Genital warts    Past Surgical History:  Procedure Laterality Date  . LASER ABLATION OF CONDYLOMAS     Family History  Problem Relation Age of Onset  . Drug abuse Father   . Cancer Paternal Aunt     leukemia  . Hypertension Maternal Aunt   . Stroke      unknown  . Diabetes Neg Hx   . Heart disease Neg Hx    Social History  Substance Use Topics  . Smoking status: Never Smoker  . Smokeless tobacco: Never Used  . Alcohol use No   OB History    Gravida Para Term Preterm AB Living   3 3 2 1        SAB TAB Ectopic Multiple Live Births                 Review of Systems  Constitutional: Negative.   HENT: Negative.   Eyes: Negative.   Respiratory: Negative.   Cardiovascular: Negative.   Gastrointestinal: Positive for abdominal pain.  Endocrine: Negative.   Genitourinary: Positive for vaginal discharge.  Musculoskeletal: Negative.   Skin: Positive  for rash.  Allergic/Immunologic: Negative.   Neurological: Negative.   Hematological: Negative.     Allergies  Patient has no known allergies.  Home Medications   Prior to Admission medications   Medication Sig Start Date End Date Taking? Authorizing Provider  cephALEXin (KEFLEX) 500 MG capsule Take 1 capsule (500 mg total) by mouth 4 (four) times daily. 11/20/16   Lysbeth Penner, FNP  cetirizine (ZYRTEC) 10 MG tablet Take 1 tablet (10 mg total) by mouth daily. 01/06/15   Virginia Crews, MD  desonide (DESOWEN) 0.05 % cream Apply topically 2 (two) times daily. 03/17/16   Virginia Crews, MD  fluticasone (FLONASE) 50 MCG/ACT nasal spray Place 2 sprays into both nostrils daily. 01/06/15   Virginia Crews, MD  hydrOXYzine (ATARAX/VISTARIL) 25 MG tablet Take 1 tablet (25 mg total) by mouth every 6 (six) hours. 11/20/16   Lysbeth Penner, FNP  ibuprofen (ADVIL,MOTRIN) 600 MG tablet Take 1 tablet (600 mg total) by mouth every 6 (six) hours as needed for mild pain. 01/06/15   Virginia Crews, MD  methylPREDNISolone (MEDROL DOSEPAK) 4 MG TBPK tablet Take 6-5-4-3-2-1 po qd 11/20/16   Lysbeth Penner, FNP  phenazopyridine (PYRIDIUM) 200 MG tablet Take 1 tablet (200 mg total) by mouth 3 (three) times daily. 11/20/16  Lysbeth Penner, FNP   Meds Ordered and Administered this Visit  Medications - No data to display  There were no vitals taken for this visit. No data found.   Physical Exam  Constitutional: She appears well-developed and well-nourished.  HENT:  Head: Normocephalic and atraumatic.  Eyes: EOM are normal. Pupils are equal, round, and reactive to light.  Neck: Normal range of motion. Neck supple.  Cardiovascular: Normal rate, regular rhythm and normal heart sounds.   Pulmonary/Chest: Effort normal and breath sounds normal.  Abdominal: Soft. Bowel sounds are normal.  Skin: Rash noted.  Right upperbreast and chest with dry erythematous rash  Nursing note and vitals  reviewed.   Urgent Care Course     Procedures (including critical care time)  Labs Review Labs Reviewed  POCT URINALYSIS DIP (DEVICE) - Abnormal; Notable for the following:       Result Value   Leukocytes, UA TRACE (*)    All other components within normal limits  URINE CULTURE    Imaging Review No results found.   Visual Acuity Review  Right Eye Distance:   Left Eye Distance:   Bilateral Distance:    Right Eye Near:   Left Eye Near:    Bilateral Near:         MDM   1. Lower urinary tract infectious disease   2. Rash    Medrol dose pack as directed Hydroxyzine 25mg  one po q 6 hours prn #12 Keflex 500mg  one po qid x 7 days #28 Pyridium 200mg  one po tid prn #6      Lysbeth Penner, FNP 11/20/16 1444

## 2016-11-21 ENCOUNTER — Ambulatory Visit: Payer: Medicaid Other | Admitting: Family Medicine

## 2016-11-21 LAB — URINE CULTURE: Culture: >=100000 — AB

## 2016-12-04 ENCOUNTER — Ambulatory Visit: Payer: Medicaid Other | Admitting: Obstetrics & Gynecology

## 2016-12-07 ENCOUNTER — Ambulatory Visit (INDEPENDENT_AMBULATORY_CARE_PROVIDER_SITE_OTHER): Payer: Medicaid Other | Admitting: Obstetrics and Gynecology

## 2016-12-07 ENCOUNTER — Encounter: Payer: Self-pay | Admitting: Obstetrics and Gynecology

## 2016-12-07 ENCOUNTER — Ambulatory Visit (HOSPITAL_COMMUNITY)
Admission: RE | Admit: 2016-12-07 | Discharge: 2016-12-07 | Disposition: A | Discharge status: Home / Self Care | Payer: Medicaid Other | Source: Ambulatory Visit | Attending: Family Medicine | Admitting: Family Medicine

## 2016-12-07 VITALS — BP 102/60 | HR 76 | Temp 98.5°F | Wt 109.0 lb

## 2016-12-07 DIAGNOSIS — K921 Melena: Secondary | ICD-10-CM

## 2016-12-07 DIAGNOSIS — R002 Palpitations: Secondary | ICD-10-CM | POA: Insufficient documentation

## 2016-12-07 NOTE — Progress Notes (Signed)
   Subjective:   Patient ID: Caroline Jensen, female    DOB: 1985/10/15, 31 y.o.   MRN: [id number]  Patient presents for Same Day Appointment  Chief Complaint  Patient presents with  . 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   #Blood in Stool About a month ago she noticed blood in her stool about 3-4 times It was not on the tissue No associated pain Denies constipation or abdominal pain Was not during time of period Blood was bright red Has nto occurred again since that time  Review of Systems   See HPI for ROS.   History  Smoking Status  . Never Smoker  Smokeless Tobacco  . Never Used    Past medical history, surgical, family, and social history reviewed and updated in the EMR as appropriate.  Pertinent Historical Findings include: None Objective:  BP 102/60   Pulse 76   Temp 98.5 F (36.9 C) (Oral)   Wt 109 lb (49.4 kg)   LMP 12/01/2016 (Approximate)   SpO2 99%   BMI 19.31 kg/m  Vitals and nursing note reviewed  Physical Exam  Constitutional: She is well-developed, well-nourished, and in no distress.  HENT:  Mouth/Throat: Oropharynx is clear and moist.  Eyes: Conjunctivae and EOM are normal. Pupils are equal, round, and reactive to light.  Neck: Normal range of motion. Neck supple. No thyromegaly present.  Cardiovascular: Normal rate, regular rhythm and normal heart sounds.   Pulmonary/Chest: Effort normal and breath sounds normal.  Musculoskeletal: Normal range of motion.  Psychiatric: Mood and affect normal.    Assessment & Plan:  1. Palpitations Concern for thyroid etiology. Will collect blood  work. Patient also could be having benign palpitations. EKG collected in clinic unremarkable for any arrhythmia; NSR. No ref flags on exam today. Patient to follow-up with PCP soon to discuss further work-up if labs unremarkable. Return precautions given.  - EKG 12-Lead - TSH - CBC - Comprehensive metabolic panel  2. Blood in stool FOBT negative in clinic. Anoscope also unremarkably. Unknown etiology of blood at this time. Patients vitals are stable. Will collect blood work to look for anemia. Patient instructed if this happens again she should see care sooner. Discussed good bowel regimen.   Diagnosis and plan were discussed in detail with this patient today. The patient verbalized understanding and agreed with the plan. Patient advised if symptoms worsen return to clinic or ER.   PATIENT EDUCATION PROVIDED: See AVS   Luiz Blare, DO 12/07/2016, 2:53 PM PGY-3, [place]

## 2016-12-07 NOTE — Patient Instructions (Signed)
Palpitations A palpitation is the feeling that your heartbeat is irregular or is faster than normal. It may feel like your heart is fluttering or skipping a beat. Palpitations are usually not a serious problem. They may be caused by many things, including smoking, caffeine, alcohol, stress, and certain medicines. Although most causes of palpitations are not serious, palpitations can be a sign of a serious medical problem. In some cases, you may need further medical evaluation. Follow these instructions at home: Pay attention to any changes in your symptoms. Take these actions to help with your condition:  Avoid the following: ? Caffeinated coffee, tea, soft drinks, diet pills, and energy drinks. ? Chocolate. ? Alcohol.  Do not use any tobacco products, such as cigarettes, chewing tobacco, and e-cigarettes. If you need help quitting, ask your health care provider.  Try to reduce your stress and anxiety. Things that can help you relax include: ? Yoga. ? Meditation. ? Physical activity, such as swimming, jogging, or walking. ? Biofeedback. This is a method that helps you learn to use your mind to control things in your body, such as your heartbeats.  Get plenty of rest and sleep.  Take over-the-counter and prescription medicines only as told by your health care provider.  Keep all follow-up visits as told by your health care provider. This is important.  Contact a health care provider if:  You continue to have a fast or irregular heartbeat after 24 hours.  Your palpitations occur more often. Get help right away if:  You have chest pain or shortness of breath.  You have a severe headache.  You feel dizzy or you faint. This information is not intended to replace advice given to you by your health care provider. Make sure you discuss any questions you have with your health care provider. Document Released: 08/18/2000 Document Revised: 01/24/2016 Document Reviewed: 05/06/2015 Elsevier  Interactive Patient Education  2017 Elsevier Inc.  

## 2016-12-08 ENCOUNTER — Encounter: Payer: Self-pay | Admitting: Obstetrics and Gynecology

## 2016-12-08 LAB — COMPREHENSIVE METABOLIC PANEL
A/G RATIO: 1.6 (ref 1.2–2.2)
ALBUMIN: 4.1 g/dL (ref 3.5–5.5)
ALT: 16 IU/L (ref 0–32)
AST: 18 IU/L (ref 0–40)
Alkaline Phosphatase: 42 IU/L (ref 39–117)
BUN / CREAT RATIO: 12 (ref 9–23)
BUN: 9 mg/dL (ref 6–20)
Bilirubin Total: 0.3 mg/dL (ref 0.0–1.2)
CALCIUM: 9.1 mg/dL (ref 8.7–10.2)
CO2: 27 mmol/L (ref 18–29)
Chloride: 100 mmol/L (ref 96–106)
Creatinine, Ser: 0.76 mg/dL (ref 0.57–1.00)
GFR, EST AFRICAN AMERICAN: 122 mL/min/{1.73_m2} (ref >59)
GFR, EST NON AFRICAN AMERICAN: 106 mL/min/{1.73_m2} (ref >59)
GLOBULIN, TOTAL: 2.5 g/dL (ref 1.5–4.5)
Glucose: 88 mg/dL (ref 65–99)
POTASSIUM: 4.1 mmol/L (ref 3.5–5.2)
Sodium: 140 mmol/L (ref 134–144)
TOTAL PROTEIN: 6.6 g/dL (ref 6.0–8.5)

## 2016-12-08 LAB — CBC
HEMATOCRIT: 37.6 % (ref 34.0–46.6)
Hemoglobin: 12.5 g/dL (ref 11.1–15.9)
MCH: 30 pg (ref 26.6–33.0)
MCHC: 33.2 g/dL (ref 31.5–35.7)
MCV: 90 fL (ref 79–97)
Platelets: 231 10*3/uL (ref 150–379)
RBC: 4.16 x10E6/uL (ref 3.77–5.28)
RDW: 13.6 % (ref 12.3–15.4)
WBC: 7 10*3/uL (ref 3.4–10.8)

## 2016-12-08 LAB — TSH: TSH: 0.27 u[IU]/mL — ABNORMAL LOW (ref 0.450–4.500)

## 2016-12-12 ENCOUNTER — Ambulatory Visit (INDEPENDENT_AMBULATORY_CARE_PROVIDER_SITE_OTHER): Payer: Medicaid Other | Admitting: Family Medicine

## 2016-12-12 ENCOUNTER — Encounter: Payer: Self-pay | Admitting: Family Medicine

## 2016-12-12 VITALS — BP 92/60 | HR 84 | Temp 98.5°F | Ht 62.0 in | Wt 109.4 lb

## 2016-12-12 DIAGNOSIS — E0590 Thyrotoxicosis, unspecified without thyrotoxic crisis or storm: Secondary | ICD-10-CM | POA: Diagnosis not present

## 2016-12-12 DIAGNOSIS — R234 Changes in skin texture: Secondary | ICD-10-CM | POA: Diagnosis not present

## 2016-12-12 HISTORY — DX: Changes in skin texture: R23.4

## 2016-12-12 MED ORDER — TRIAMCINOLONE ACETONIDE 0.1 % EX OINT: 1.0000 "application " | TOPICAL_OINTMENT | Freq: Two times a day (BID) | CUTANEOUS | 0 refills | Status: DC

## 2016-12-12 NOTE — Patient Instructions (Signed)
Nice to see you today. We will get an ultrasound of your breasts to make sure there is nothing underlying the skin changes especially given your strong family history. You can put triamcinolone ointment on the area twice daily to help with the rash and itching as it is likely eczema.  We will recheck your thyroid TSH and T4. I will let you know about these results and whether or not we need to get an ultrasound of your thyroid.  Take care, Dr. Jacinto Reap

## 2016-12-12 NOTE — Progress Notes (Signed)
Subjective:   Caroline Jensen is a 31 y.o. female with a history of Allergic rhinitis here for breast skin changes and follow-up of depressed TSH  R breast rash - hydrocortisone - stopped working - intermittent, red, and pruritic - treated with medrol dose pack by UC - didn't seem to help - hasnt noticed any nipple discharge or breast lumps - family history of breast cancer in paternal aunt who died in her 59s  Seen 70/5 for same day visit for palpitations - found to have TSH of 0.270 - previously normal thyroid function in 2016 - does have loose stools intermittently, dry skin, weight loss, palpitations with SOB, irregular menses  Review of Systems:  Per HPI.   Social History: never smoker  Objective:  BP 92/60 (BP Location: Right Arm, Patient Position: Sitting, Cuff Size: Normal)   Pulse 84   Temp 98.5 F (36.9 C) (Oral)   Ht 5\' 2"  (1.575 m)   Wt 109 lb 6.4 oz (49.6 kg)   LMP 12/01/2016 (Approximate)   SpO2 99%   BMI 20.01 kg/m   Gen:  31 y.o. female in NAD HEENT: NCAT, MMM, EOMI, PERRL, anicteric sclerae Neck: Supple, no LAD, no thyromegaly CV: RRR, no MRG Resp: Non-labored, CTAB, no wheezes noted Breasts: Symmetric bilateral fibrous changes. No discrete lumps palpated in either breast. No axillary lymphadenopathy. Dry, erythematous rash over her entire right breast that does not extend chest wall and does not include areola. No nipple discharge. Ext: WWP, no edema MSK: No obvious deformities, gait intact Neuro: Alert and oriented, speech normal       Chemistry      Component Value Date/Time   NA 140 12/07/2016 1625   K 4.1 12/07/2016 1625   CL 100 12/07/2016 1625   CO2 27 12/07/2016 1625   BUN 9 12/07/2016 1625   CREATININE 0.76 12/07/2016 1625      Component Value Date/Time   CALCIUM 9.1 12/07/2016 1625   ALKPHOS 42 12/07/2016 1625   AST 18 12/07/2016 1625   ALT 16 12/07/2016 1625   BILITOT 0.3 12/07/2016 1625      Lab Results  Component Value Date     WBC 7.0 12/07/2016   HGB 12.7 01/17/2016   HCT 37.6 12/07/2016   MCV 90 12/07/2016   PLT 231 12/07/2016   Lab Results  Component Value Date   TSH 0.315 (L) 12/12/2016   No results found for: HGBA1C Assessment & Plan:     Caroline Jensen is a 31 y.o. female here for   Hyperthyroidism Patient symptomatic with palpitations, diarrhea, weight loss, and irregular menses We will recheck TSH and free T4 today - these resulted and show subclinical hyperthyroidism Discussed with patient and as she is symptomatic, we will refer her to endocrinology to see if they would like to do any further workup Other option would be observation and rechecking labs in 6 months Likely no indication for imaging at this time, but will leave this decision up to endocrinology  Breast skin changes Skin changes are confined only to right breast Could be eczema as patient is atopic, but it is strange that it does not extend to the chest wall Patient does have family history of early breast cancer in her paternal aunt and she is concerned about this possibility for herself We will obtain ultrasound of her right breast to ensure that no mass or concerning features underlie this skin change Treat for possible eczema with triamcinolone ointment twice a day  Caroline Crews, MD MPH PGY-3,  [facility] 12/13/2016  3:36 PM

## 2016-12-13 LAB — T4, FREE: Free T4: 1.16 ng/dL (ref 0.82–1.77)

## 2016-12-13 LAB — TSH: TSH: 0.315 u[IU]/mL — ABNORMAL LOW (ref 0.450–4.500)

## 2016-12-13 NOTE — Assessment & Plan Note (Signed)
Skin changes are confined only to right breast Could be eczema as patient is atopic, but it is strange that it does not extend to the chest wall Patient does have family history of early breast cancer in her paternal aunt and she is concerned about this possibility for herself We will obtain ultrasound of her right breast to ensure that no mass or concerning features underlie this skin change Treat for possible eczema with triamcinolone ointment twice a day

## 2016-12-13 NOTE — Assessment & Plan Note (Signed)
Patient symptomatic with palpitations, diarrhea, weight loss, and irregular menses We will recheck TSH and free T4 today - these resulted and show subclinical hyperthyroidism Discussed with patient and as she is symptomatic, we will refer her to endocrinology to see if they would like to do any further workup Other option would be observation and rechecking labs in 6 months Likely no indication for imaging at this time, but will leave this decision up to endocrinology

## 2016-12-25 ENCOUNTER — Ambulatory Visit
Admission: RE | Admit: 2016-12-25 | Discharge: 2016-12-25 | Disposition: A | Discharge status: Home / Self Care | Payer: Medicaid Other | Source: Ambulatory Visit | Attending: Family Medicine | Admitting: Family Medicine

## 2016-12-25 ENCOUNTER — Other Ambulatory Visit: Payer: Self-pay | Admitting: Family Medicine

## 2016-12-25 DIAGNOSIS — R234 Changes in skin texture: Secondary | ICD-10-CM

## 2017-02-01 DIAGNOSIS — R634 Abnormal weight loss: Secondary | ICD-10-CM | POA: Diagnosis not present

## 2017-02-01 DIAGNOSIS — R7989 Other specified abnormal findings of blood chemistry: Secondary | ICD-10-CM | POA: Insufficient documentation

## 2017-02-01 DIAGNOSIS — R946 Abnormal results of thyroid function studies: Secondary | ICD-10-CM | POA: Diagnosis not present

## 2017-02-01 DIAGNOSIS — R5383 Other fatigue: Secondary | ICD-10-CM | POA: Diagnosis not present

## 2017-02-01 DIAGNOSIS — R002 Palpitations: Secondary | ICD-10-CM | POA: Diagnosis not present

## 2017-02-01 HISTORY — DX: Other specified abnormal findings of blood chemistry: R79.89

## 2017-02-01 HISTORY — DX: Other fatigue: R53.83

## 2017-02-23 ENCOUNTER — Other Ambulatory Visit: Payer: Self-pay | Admitting: Family Medicine

## 2017-02-23 NOTE — Telephone Encounter (Signed)
Pt is calling and needs a refill on her Triamcinolone sent to a pharmacy in another state since she is on vacation. Please send this to the [facility] [id number]. [place] [phone]. jw

## 2017-02-26 MED ORDER — TRIAMCINOLONE ACETONIDE 0.1 % EX OINT: 1.0000 "application " | TOPICAL_OINTMENT | Freq: Two times a day (BID) | CUTANEOUS | 0 refills | Status: DC

## 2017-07-06 ENCOUNTER — Emergency Department (HOSPITAL_COMMUNITY): Payer: Medicaid Other

## 2017-07-06 ENCOUNTER — Emergency Department (HOSPITAL_COMMUNITY)
Admission: EM | Admit: 2017-07-06 | Discharge: 2017-07-06 | Disposition: A | Discharge status: Home / Self Care | Payer: Medicaid Other | Attending: Emergency Medicine | Admitting: Emergency Medicine

## 2017-07-06 DIAGNOSIS — R202 Paresthesia of skin: Secondary | ICD-10-CM | POA: Diagnosis not present

## 2017-07-06 DIAGNOSIS — R002 Palpitations: Secondary | ICD-10-CM | POA: Diagnosis not present

## 2017-07-06 DIAGNOSIS — R0789 Other chest pain: Secondary | ICD-10-CM | POA: Insufficient documentation

## 2017-07-06 DIAGNOSIS — F410 Panic disorder [episodic paroxysmal anxiety] without agoraphobia: Secondary | ICD-10-CM | POA: Insufficient documentation

## 2017-07-06 DIAGNOSIS — R0602 Shortness of breath: Secondary | ICD-10-CM | POA: Insufficient documentation

## 2017-07-06 LAB — I-STAT CHEM 8, ED
BUN: 5 mg/dL — ABNORMAL LOW (ref 6–20)
CALCIUM ION: 1.16 mmol/L (ref 1.15–1.40)
CHLORIDE: 105 mmol/L (ref 101–111)
CREATININE: 0.7 mg/dL (ref 0.44–1.00)
GLUCOSE: 87 mg/dL (ref 65–99)
HCT: 37 % (ref 36.0–46.0)
HEMOGLOBIN: 12.6 g/dL (ref 12.0–15.0)
POTASSIUM: 3.4 mmol/L — AB (ref 3.5–5.1)
Sodium: 141 mmol/L (ref 135–145)
TCO2: 24 mmol/L (ref 22–32)

## 2017-07-06 LAB — D-DIMER, QUANTITATIVE: D-Dimer, Quant: <0.27 ug/mL-FEU (ref 0.00–0.50)

## 2017-07-06 LAB — I-STAT BETA HCG BLOOD, ED (MC, WL, AP ONLY)

## 2017-07-06 LAB — I-STAT TROPONIN, ED: TROPONIN I, POC: 0 ng/mL (ref 0.00–0.08)

## 2017-07-06 NOTE — ED Provider Notes (Addendum)
[place] DEPT Provider Note   CSN: [id number] Arrival date & time: 07/06/17  0850     History   Chief Complaint Chief Complaint  Patient presents with  . Panic Attack    HPI Caroline Jensen is a 31 y.o. female.  Planes of anterior chest pressure onset 3 AM today.  Intermittent, waxes and wanes accompanied by palpitations, and feeling her heartbeat.  She states that she checked her pulse several times throughout the night the lowest pulse rate was 57 the highest pulse rate was in the mid 60s.  Symptoms accompanied by shortness of breath.  Nothing makes symptoms better or worse.  Other associated symptoms include numbness in her left upper arm and tingling around her mouth and in both legs.  She feels improved presently over earlier today.  No treatment prior to coming here. HPI  Past Medical History:  Diagnosis Date  . Genital warts     Patient Active Problem List   Diagnosis Date Noted  . Breast skin changes 12/12/2016  . Hyperthyroidism 12/12/2016  . Allergic rhinitis 01/06/2015  . Healthcare maintenance 01/06/2015    Past Surgical History:  Procedure Laterality Date  . LASER ABLATION OF CONDYLOMAS      OB History    Gravida Para Term Preterm AB Living   3 3 2 1        SAB TAB Ectopic Multiple Live Births                   Home Medications    Prior to Admission medications   Medication Sig Start Date End Date Taking? Authorizing Provider  aspirin-acetaminophen-caffeine (EXCEDRIN EXTRA STRENGTH) [phone] MG tablet Take 2 tablets by mouth every 6 (six) hours as needed for headache or migraine.   Yes [provider]  triamcinolone ointment (KENALOG) 0.1 % Apply 1 application topically 2 (two) times daily. Patient not taking: Reported on 07/06/2017 02/26/17   Virginia Crews, MD    Family History Family History  Problem Relation Age of Onset  . Drug abuse Father   . Cancer Paternal Aunt        leukemia  .  Hypertension Maternal Aunt   . Stroke Unknown        unknown  . Diabetes Neg Hx   . Heart disease Neg Hx     Social History Social History  Substance Use Topics  . Smoking status: Never Smoker  . Smokeless tobacco: Never Used  . Alcohol use No     Allergies   Patient has no known allergies.   Review of Systems Review of Systems  Constitutional: Negative.   HENT: Negative.   Respiratory: Positive for shortness of breath.   Cardiovascular: Positive for chest pain.  Gastrointestinal: Negative.   Genitourinary:       Currently on menses  Musculoskeletal: Negative.   Skin: Negative.   Neurological: Positive for numbness.  Psychiatric/Behavioral: Negative.   All other systems reviewed and are negative.    Physical Exam Updated Vital Signs BP 107/80 (BP Location: Left Arm)   Pulse 63   Temp 98.1 F (36.7 C) (Oral)   Resp 11   SpO2 100%   Physical Exam  Constitutional: She appears well-developed and well-nourished.  HENT:  Head: Normocephalic and atraumatic.  Eyes: Pupils are equal, round, and reactive to light. Conjunctivae are normal.  Neck: Neck supple. No tracheal deviation present. No thyromegaly present.  Cardiovascular: Normal rate and regular rhythm.   No murmur heard. Pulmonary/Chest: Effort  normal and breath sounds normal.  Abdominal: Soft. Bowel sounds are normal. She exhibits no distension. There is no tenderness.  Musculoskeletal: Normal range of motion. She exhibits no edema or tenderness.  Neurological: She is alert. Coordination normal.  Skin: Skin is warm and dry. No rash noted.  Psychiatric: She has a normal mood and affect.  Nursing note and vitals reviewed.    ED Treatments / Results  Labs (all labs ordered are listed, but only abnormal results are displayed) Labs Reviewed  I-STAT TROPONIN, ED  I-STAT CHEM 8, ED  I-STAT BETA HCG BLOOD, ED ([facility], [facility], [facility] ONLY)    EKG  EKG Interpretation  Date/Time:  Friday July 06 2017 09:06:34  EDT Ventricular Rate:  60 PR Interval:    QRS Duration: 86 QT Interval:  415 QTC Calculation: 415 R Axis:   73 Text Interpretation:  Sinus rhythm No significant change since last tracing Confirmed by Orlie Dakin [phone]) on 07/06/2017 10:30:24 AM       Radiology No results found.  Procedures Procedures (including critical care time)  Medications Ordered in ED Medications - No data to display   Initial Impression / Assessment and Plan / ED Course  I have reviewed the triage vital signs and the nursing notes.  Pertinent labs & imaging results that were available during my care of the patient were reviewed by me and considered in my medical decision making (see chart for details).     12:20 PM patient resting comfortably states "I feel weird" but appears in no distress. Plan rest, follow-up with PMD, at family practice center if not better by next week. Low pretest clinical probability for pulmonary embolism, negative d-dimer and heart score equals 0 Final Clinical Impressions(s) / ED Diagnoses  Diagnosis atypical chest pain Final diagnoses:  None    New Prescriptions New Prescriptions   No medications on file     Orlie Dakin, MD 07/06/17 [place], MD 07/06/17 1234

## 2017-07-06 NOTE — ED Triage Notes (Signed)
Transported by [facility] from [facility]. Patient is a Ship broker at [employer]; patient called EMS due to palpitations and possible panic attack. Patient has no hx of anxiety/painic attacks and wanted to come to ED for further evaluation.

## 2017-07-06 NOTE — ED Notes (Signed)
Bed: GQ91 Expected date:  Expected time:  Means of arrival:  Comments: EMS anxiety attack

## 2017-07-06 NOTE — Discharge Instructions (Signed)
Contact your primary care physician at the family practice center to arrange to be seen if not feeling better by next week and return if concern for any reason.  Rest over the weekend.

## 2017-07-10 ENCOUNTER — Encounter (HOSPITAL_COMMUNITY): Payer: Self-pay | Admitting: *Deleted

## 2017-07-10 ENCOUNTER — Other Ambulatory Visit: Payer: Self-pay

## 2017-07-10 ENCOUNTER — Emergency Department (HOSPITAL_COMMUNITY)
Admission: EM | Admit: 2017-07-10 | Discharge: 2017-07-10 | Disposition: A | Discharge status: Home / Self Care | Payer: Medicaid Other | Attending: Emergency Medicine | Admitting: Emergency Medicine

## 2017-07-10 ENCOUNTER — Emergency Department (HOSPITAL_COMMUNITY): Payer: Medicaid Other

## 2017-07-10 ENCOUNTER — Telehealth: Payer: Self-pay | Admitting: Student

## 2017-07-10 DIAGNOSIS — R0789 Other chest pain: Secondary | ICD-10-CM | POA: Diagnosis not present

## 2017-07-10 DIAGNOSIS — R079 Chest pain, unspecified: Secondary | ICD-10-CM | POA: Diagnosis present

## 2017-07-10 LAB — BASIC METABOLIC PANEL
ANION GAP: 5 (ref 5–15)
BUN: 6 mg/dL (ref 6–20)
CALCIUM: 8.9 mg/dL (ref 8.9–10.3)
CO2: 24 mmol/L (ref 22–32)
Chloride: 108 mmol/L (ref 101–111)
Creatinine, Ser: 0.71 mg/dL (ref 0.44–1.00)
GFR calc Af Amer: >60 mL/min (ref >60)
GLUCOSE: 87 mg/dL (ref 65–99)
Potassium: 3.3 mmol/L — ABNORMAL LOW (ref 3.5–5.1)
Sodium: 137 mmol/L (ref 135–145)

## 2017-07-10 LAB — T4, FREE: Free T4: 1.11 ng/dL (ref 0.61–1.12)

## 2017-07-10 LAB — CBC
HEMATOCRIT: 33.8 % — AB (ref 36.0–46.0)
HEMOGLOBIN: 11.3 g/dL — AB (ref 12.0–15.0)
MCH: 30.1 pg (ref 26.0–34.0)
MCHC: 33.4 g/dL (ref 30.0–36.0)
MCV: 90.1 fL (ref 78.0–100.0)
Platelets: 201 10*3/uL (ref 150–400)
RBC: 3.75 MIL/uL — ABNORMAL LOW (ref 3.87–5.11)
RDW: 13.2 % (ref 11.5–15.5)
WBC: 4.7 10*3/uL (ref 4.0–10.5)

## 2017-07-10 LAB — I-STAT TROPONIN, ED
TROPONIN I, POC: 0 ng/mL (ref 0.00–0.08)
TROPONIN I, POC: 0 ng/mL (ref 0.00–0.08)

## 2017-07-10 LAB — TSH: TSH: 0.416 u[IU]/mL (ref 0.350–4.500)

## 2017-07-10 MED ORDER — KETOROLAC TROMETHAMINE 30 MG/ML IJ SOLN
15.0000 mg | Freq: Once | INTRAMUSCULAR | Status: AC
  Administered 2017-07-10: 15 mg via INTRAVENOUS
  Filled 2017-07-10: qty 1

## 2017-07-10 MED ORDER — HYDROXYZINE HCL 25 MG PO TABS: 25.0000 mg | ORAL_TABLET | Freq: Four times a day (QID) | ORAL | 0 refills | Status: DC | PRN

## 2017-07-10 MED ORDER — HYDROXYZINE HCL 25 MG PO TABS
25.0000 mg | ORAL_TABLET | Freq: Once | ORAL | Status: AC
  Administered 2017-07-10: 25 mg via ORAL
  Filled 2017-07-10: qty 1

## 2017-07-10 NOTE — Discharge Instructions (Signed)
Please follow with your primary care doctor in the next 2 days for a check-up. They must obtain records for further management.   Do not hesitate to return to the Emergency Department for any new, worsening or concerning symptoms.   For pain control please take ibuprofen (also known as Motrin or Advil) 800mg  (this is normally 4 over the counter pills) 3 times a day  for 5 days. Take with food to minimize stomach irritation.

## 2017-07-10 NOTE — ED Provider Notes (Signed)
[facility] EMERGENCY DEPARTMENT Provider Note   CSN: [id number] Arrival date & time: 07/10/17  0408     History   Chief Complaint Chief Complaint  Patient presents with  . Chest Pain    HPI   Blood pressure 112/76, pulse 63, temperature 98 F (36.7 C), temperature source Oral, resp. rate 18, height 5\' 2"  (1.575 m), weight 48.5 kg (107 lb), last menstrual period 07/01/2017, SpO2 100 %.  Caroline Jensen is a 31 y.o. female with past medical history significant for hyperthyroidism (was initially diagnosed by endocrinologist in April 2018, missed recheck 3 months later, is on no medications) complaining of chest tightness with associated palpitations intermittently, she has had several episodes over the course of the last 2 days.  She is accompanied by her friend who is a nurse who states that she feels it is a panic attack.  This patient denies any shortness of breath, fever, cough, hemoptysis, tobacco use, cocaine methamphetamine use, family history of early cardiac death, hypertension, hyperlipidemia, history of DVT/PE, exogenous estrogen, recent immobilization, calf pain, leg swelling.  No medications taken prior to arrival.  She states that she feels exhausted after the severity of the pain earlier.  Past Medical History:  Diagnosis Date  . Genital warts     Patient Active Problem List   Diagnosis Date Noted  . Breast skin changes 12/12/2016  . Hyperthyroidism 12/12/2016  . Allergic rhinitis 01/06/2015  . Healthcare maintenance 01/06/2015    Past Surgical History:  Procedure Laterality Date  . LASER ABLATION OF CONDYLOMAS      OB History    Gravida Para Term Preterm AB Living   3 3 2 1        SAB TAB Ectopic Multiple Live Births                   Home Medications    Prior to Admission medications   Medication Sig Start Date End Date Taking? Authorizing Provider  aspirin-acetaminophen-caffeine (EXCEDRIN EXTRA STRENGTH) [phone] MG tablet Take 2  tablets by mouth every 6 (six) hours as needed for headache or migraine.   Yes [provider]  naproxen sodium (ALEVE) 220 MG tablet Take 220 mg 2 (two) times daily as needed by mouth (pain).   Yes [provider]  triamcinolone ointment (KENALOG) 0.1 % Apply 1 application topically 2 (two) times daily. Patient taking differently: Apply 1 application daily as needed topically (irritation).  02/26/17  Yes Bacigalupo, Dionne Bucy, MD  hydrOXYzine (ATARAX/VISTARIL) 25 MG tablet Take 1 tablet (25 mg total) every 6 (six) hours as needed by mouth for anxiety. 07/10/17   Noe Pittsley, Elmyra Ricks, PA-C    Family History Family History  Problem Relation Age of Onset  . Drug abuse Father   . Cancer Paternal Aunt        leukemia  . Hypertension Maternal Aunt   . Stroke Unknown        unknown  . Diabetes Neg Hx   . Heart disease Neg Hx     Social History Social History   Tobacco Use  . Smoking status: Never Smoker  . Smokeless tobacco: Never Used  Substance Use Topics  . Alcohol use: No    Alcohol/week: 0.0 oz  . Drug use: No     Allergies   Patient has no known allergies.   Review of Systems Review of Systems  A complete review of systems was obtained and all systems are negative except as noted in the HPI  and PMH.   Physical Exam Updated Vital Signs BP 102/72   Pulse (!) 50   Temp 98 F (36.7 C) (Oral)   Resp 14   Ht 5\' 2"  (1.575 m)   Wt 48.5 kg (107 lb)   LMP 07/01/2017   SpO2 99%   BMI 19.57 kg/m   Physical Exam  Constitutional: She is oriented to person, place, and time. She appears well-developed and well-nourished. No distress.  HENT:  Head: Normocephalic and atraumatic.  Mouth/Throat: Oropharynx is clear and moist.  Eyes: Conjunctivae and EOM are normal. Pupils are equal, round, and reactive to light.  Neck: Normal range of motion. No JVD present. No tracheal deviation present.  Cardiovascular: Normal rate, regular rhythm and intact distal pulses.    Radial pulse equal bilaterally  Pulmonary/Chest: Effort normal and breath sounds normal. No stridor. No respiratory distress. She has no wheezes. She has no rales. She exhibits no tenderness.  Abdominal: Soft. She exhibits no distension and no mass. There is no tenderness. There is no rebound and no guarding.  Musculoskeletal: Normal range of motion. She exhibits no edema or tenderness.  No calf asymmetry, superficial collaterals, palpable cords, edema, Homans sign negative bilaterally.    Neurological: She is alert and oriented to person, place, and time.  Skin: Skin is warm. She is not diaphoretic.  Psychiatric: She has a normal mood and affect.  Nursing note and vitals reviewed.    ED Treatments / Results  Labs (all labs ordered are listed, but only abnormal results are displayed) Labs Reviewed  BASIC METABOLIC PANEL - Abnormal; Notable for the following components:      Result Value   Potassium 3.3 (*)    All other components within normal limits  CBC - Abnormal; Notable for the following components:   RBC 3.75 (*)    Hemoglobin 11.3 (*)    HCT 33.8 (*)    All other components within normal limits  TSH  T4, FREE  I-STAT TROPONIN, ED  I-STAT TROPONIN, ED  I-STAT TROPONIN, ED    EKG  EKG Interpretation  Date/Time:  Tuesday July 10 2017 04:21:59 EST Ventricular Rate:  62 PR Interval:  138 QRS Duration: 80 QT Interval:  428 QTC Calculation: 434 R Axis:   83 Text Interpretation:  Normal sinus rhythm Normal ECG agree. no sig change Confirmed by Charlesetta Shanks [phone]) on 07/10/2017 8:37:16 AM       Radiology Dg Chest 2 View  Result Date: 07/10/2017 CLINICAL DATA:  Chest pressure. Heart racing. Seen 4 days ago for similar complaints. EXAM: CHEST  2 VIEW COMPARISON:  07/06/2017 FINDINGS: The heart size and mediastinal contours are within normal limits. Both lungs are clear. The visualized skeletal structures are unremarkable. IMPRESSION: No active cardiopulmonary  disease. Electronically Signed   By: Lucienne Capers M.D.   On: 07/10/2017 05:18    Procedures Procedures (including critical care time)  Medications Ordered in ED Medications  ketorolac (TORADOL) 30 MG/ML injection 15 mg (15 mg Intravenous Given 07/10/17 0920)  hydrOXYzine (ATARAX/VISTARIL) tablet 25 mg (25 mg Oral Given 07/10/17 0920)     Initial Impression / Assessment and Plan / ED Course  I have reviewed the triage vital signs and the nursing notes.  Pertinent labs & imaging results that were available during my care of the patient were reviewed by me and considered in my medical decision making (see chart for details).     Vitals:   07/10/17 1100 07/10/17 1130 07/10/17 1200 07/10/17 1230  BP: 111/67 117/69 (!) 101/58 102/72  Pulse: (!) 48 60 (!) 53 (!) 50  Resp: 18 15 15 14   Temp:      TempSrc:      SpO2: 100% 100% 98% 99%  Weight:      Height:        Medications  ketorolac (TORADOL) 30 MG/ML injection 15 mg (15 mg Intravenous Given 07/10/17 0920)  hydrOXYzine (ATARAX/VISTARIL) tablet 25 mg (25 mg Oral Given 07/10/17 0920)    Caroline Jensen is 31 y.o. female presenting with chest pain, palpitation she had a similar episode several days ago and was evaluated at [facility], she had a negative d-dimer at this time, this patient is low risk by Wells score and PERC negative.  Lung sounds clear to auscultation, EKG without acute findings, she is a low risk by heart score, troponin negative.  I think this might possibly be related to her thyroid dysfunction, I encouraged her to be followed closely with both primary care and her endocrinologist.  Thyroid function test within normal limits.  She had another episode of chest pain.  Repeat troponin is negative.  I think she is stable for close follow-up with primary care.  She is invited to return to the ED at anytime for any new symptoms.  Patient verbalized understanding and teach back technique.  Evaluation does not show pathology  that would require ongoing emergent intervention or inpatient treatment. Pt is hemodynamically stable and mentating appropriately. Discussed findings and plan with patient/guardian, who agrees with care plan. All questions answered. Return precautions discussed and outpatient follow up given.    Final Clinical Impressions(s) / ED Diagnoses   Final diagnoses:  Atypical chest pain    ED Discharge Orders        Ordered    hydrOXYzine (ATARAX/VISTARIL) 25 MG tablet  Every 6 hours PRN     07/10/17 1251       Letisia Schwalb, Charna Elizabeth 07/10/17 1257    Charlesetta Shanks, MD 07/11/17 [phone]

## 2017-07-10 NOTE — Telephone Encounter (Signed)
FM after hour call response: Called patient in response to her page on after hour pager. Patient picked up the phone and verified her name and age.  CC: Heart racing HPI: Patient reports heart racing, pressure-like chest pain and some dyspnea.  She says she called the clinic earlier but has not heard back.  She denies fever.  She has history low TSH which could be contributing to this.  It could also be panic attack. However, I cannot rule out arrhythmia, ACS (although less likely given age) or other life-threatening conditions over the phone.  So, I advised her  to come to emergency department or call EMS.  Patient voiced understanding and agrees.

## 2017-07-10 NOTE — ED Notes (Signed)
ED Provider at bedside. 

## 2017-07-10 NOTE — ED Triage Notes (Signed)
C/o chest pressure states her heart feels like its racing at times

## 2017-07-17 ENCOUNTER — Ambulatory Visit: Payer: Medicaid Other | Admitting: Family Medicine

## 2017-07-17 NOTE — Progress Notes (Signed)
   Subjective:    Patient ID: Caroline Jensen, female    DOB: Apr 24, 1986, 31 y.o.   MRN: [id number]   CC: ER follow up  HPI: Chest Pain follow up Patient today reports "spells" of chest palpitations x 2 months. States they are intermittent but now are more frequent and are worsening. States that when they occur they can last anywhere between 5-20 min. States during these episodes chest feels like it is racing and then can slow down. States she has associated nausea and dizziness. Denies LOC. Endorses chest pressure when events happen. States when heart slows down she can feel it pounding. After episode subsides she feels very weak. There are no triggers to these events and they can happen both at rest and activity. Nothing makes events better, and nothing makes them worse. States that she has no energy now due to these events. Endorses dry skin and constipation. Patient denies changes in weight or diet. No change in activity. No stressors. Patient states events not related to anxiety, but do cause her to be anxious about diagnosis. States she had one panic attack following an episode for fear something was wrong with her.   Seen in ED on 07/10/17 with chest tightness and palpitations. EKG at that time NSR. Troponin negative at that time. TSH wnl. Free T4 wnl. BMP with low K of 3.3. Hemoglobin 11.3 at that time. In ED patient stated that her HR dropped to 40s and then increased to 90s.   Patient has been seen by [facility] in past at which time Endocrinologist believed this to be unrelated to thyroid.   Objective:  BP (!) 86/42   Pulse 77   Temp 98.4 F (36.9 C) (Oral)   Ht 5\' 2"  (1.575 m)   Wt 109 lb 6.4 oz (49.6 kg)   LMP 07/06/2017   SpO2 99%   BMI 20.01 kg/m  Vitals and nursing note reviewed  General: well nourished, in no acute distress HEENT: normocephalic, no scleral icterus or conjunctival pallor, PERRL, EOMI, no nasal discharge, moist mucous membranes, without erythema  or discharge noted in posterior oropharynx Neck: supple, non-tender, without lymphadenopathy Cardiac: RRR, clear S1 and S2, no murmurs, rubs, or gallops Respiratory: clear to auscultation bilaterally, no increased work of breathing Abdomen: soft, nontender, nondistended, no masses or organomegaly. Bowel sounds present Extremities: no edema or cyanosis. Warm, well perfused. 2+ radial pulses bilaterally, pulses regular  Skin: warm and dry, no rashes noted Neuro: alert and oriented, no focal deficits  Assessment & Plan:    Palpitations Patient today with episodes of palpitations with associated tachycardia and bradycardia. Chest pressure also endorses during episodes. Etiology unclear at this time. Less likely thyroid given normal TSH levels. Less likely anemia given only slight decrease in Hgb. Possible cardiac origin, however unable to confirm given EKG obtained when patient was at baseline. Possibly related to disordered eating given thin frame, however BMI 20 and weight stable. Patient denies history of weight or dietary change.  -Referral made to cardiology, will likely need Holter Monitor  -follow up pending cardiology plan   Hypotension Patient with hypotension on exam today. Patient is asymptomatic. Likely 2/2 poor fluid intake -continue to monitor -encouraged increased fluid hydration  -strict return precautions given -follow up if symptomatic   Return if symptoms worsen or fail to improve.  Caroline More, DO, PGY-1

## 2017-07-18 ENCOUNTER — Encounter: Payer: Self-pay | Admitting: Family Medicine

## 2017-07-18 ENCOUNTER — Ambulatory Visit: Payer: Medicaid Other | Admitting: Family Medicine

## 2017-07-18 ENCOUNTER — Other Ambulatory Visit: Payer: Self-pay

## 2017-07-18 VITALS — BP 86/42 | HR 77 | Temp 98.4°F | Ht 62.0 in | Wt 109.4 lb

## 2017-07-18 DIAGNOSIS — R002 Palpitations: Secondary | ICD-10-CM

## 2017-07-18 DIAGNOSIS — I959 Hypotension, unspecified: Secondary | ICD-10-CM | POA: Diagnosis not present

## 2017-07-18 HISTORY — DX: Palpitations: R00.2

## 2017-07-18 NOTE — Assessment & Plan Note (Signed)
Patient with hypotension on exam today. Patient is asymptomatic. Likely 2/2 poor fluid intake -continue to monitor -encouraged increased fluid hydration  -strict return precautions given -follow up if symptomatic

## 2017-07-18 NOTE — Assessment & Plan Note (Addendum)
Patient today with episodes of palpitations with associated tachycardia and bradycardia. Chest pressure also endorses during episodes. Etiology unclear at this time. Less likely thyroid given normal TSH levels. Less likely anemia given only slight decrease in Hgb. Possible cardiac origin, however unable to confirm given EKG obtained when patient was at baseline. Possibly related to disordered eating given thin frame, however BMI 20 and weight stable. Patient denies history of weight or dietary change.  -Referral made to cardiology, will likely need Holter Monitor  -follow up pending cardiology plan

## 2017-07-18 NOTE — Patient Instructions (Signed)
Palpitations A palpitation is the feeling that your heart:  Has an uneven (irregular) heartbeat.  Is beating faster than normal.  Is fluttering.  Is skipping a beat.  This is usually not a serious problem. In some cases, you may need more medical tests. Follow these instructions at home:  Avoid: ? Caffeine in coffee, tea, soft drinks, diet pills, and energy drinks. ? Chocolate. ? Alcohol.  Do not use any tobacco products. These include cigarettes, chewing tobacco, and e-cigarettes. If you need help quitting, ask your doctor.  Try to reduce your stress. These things may help: ? Yoga. ? Meditation. ? Physical activity. Swimming, jogging, and walking are good choices. ? A method that helps you use your mind to control things in your body, like heartbeats (biofeedback).  Get plenty of rest and sleep.  Take over-the-counter and prescription medicines only as told by your doctor.  Keep all follow-up visits as told by your doctor. This is important. Contact a doctor if:  Your heartbeat is still fast or uneven after 24 hours.  Your palpitations occur more often. Get help right away if:  You have chest pain.  You feel short of breath.  You have a very bad headache.  You feel dizzy.  You pass out (faint). This information is not intended to replace advice given to you by your health care provider. Make sure you discuss any questions you have with your health care provider. Document Released: 05/30/2008 Document Revised: 01/27/2016 Document Reviewed: 05/06/2015 Elsevier Interactive Patient Education  [employer].   It was a pleasure meeting you today.   Today we discussed your heart palpitations and the heart spells you've been having.   Since these episodes occur with no trigger I recommend you have a Holter monitor which can monitor your heart rate throughout the day to see if we can see what rhythm you are in when this occurs.   I have referred you to  cardiology for workup for these spells and also for a holter monitor.   For your blood pressure since you are not symptomatic I would recommend drinking lots of water today and staying hydrated every day. I would recommend you follow up if you develop dizziness.   Please follow up as needed or sooner if symptoms persist or worsen. Please call the clinic immediately if these spells worsen or become more frequent or you have any concerns.   Our clinic's number is [phone]. Please call with questions or concerns.   Thank you,  Caroline More, DO

## 2017-07-31 ENCOUNTER — Ambulatory Visit: Payer: Medicaid Other | Admitting: Obstetrics and Gynecology

## 2017-07-31 ENCOUNTER — Other Ambulatory Visit (HOSPITAL_COMMUNITY)
Admission: RE | Admit: 2017-07-31 | Discharge: 2017-07-31 | Disposition: A | Discharge status: Home / Self Care | Payer: Medicaid Other | Source: Ambulatory Visit | Attending: Obstetrics and Gynecology | Admitting: Obstetrics and Gynecology

## 2017-07-31 ENCOUNTER — Encounter: Payer: Self-pay | Admitting: Obstetrics and Gynecology

## 2017-07-31 VITALS — BP 103/69 | HR 86 | Ht 62.0 in | Wt 108.0 lb

## 2017-07-31 DIAGNOSIS — Z01419 Encounter for gynecological examination (general) (routine) without abnormal findings: Secondary | ICD-10-CM

## 2017-07-31 DIAGNOSIS — Z0000 Encounter for general adult medical examination without abnormal findings: Secondary | ICD-10-CM | POA: Diagnosis not present

## 2017-07-31 DIAGNOSIS — M545 Low back pain, unspecified: Secondary | ICD-10-CM

## 2017-07-31 DIAGNOSIS — N9089 Other specified noninflammatory disorders of vulva and perineum: Secondary | ICD-10-CM

## 2017-07-31 DIAGNOSIS — Z113 Encounter for screening for infections with a predominantly sexual mode of transmission: Secondary | ICD-10-CM

## 2017-07-31 NOTE — Progress Notes (Signed)
GYNECOLOGY ANNUAL PREVENTATIVE CARE ENCOUNTER NOTE  Subjective:   Caroline Jensen is a 31 y.o. [phone] female here for a annual gynecologic exam. Current complaints: vaginal irritation and dysuria.  Itching, irritation and raw feeling on pubic bone and has traveled to area near rectum as well. The raw/itching has been going on for 8 months, the back pain is recent, ~ 2 weeks. Uses dove unscented soap, wears lace/tulle underwear. Denies use of other creams/lotions, sprays. Tried triamcinolone cream, eucerin, andy ointment, hydrocortisone.  Also reports left sided flank pain when she has the urge to urinate that improves about 30 min after she voids. Reports that it starts once she has the urge to go. Also has vaginal discharge, no itching/odor but feels like it is "a lot" of discharge.   Regular monthly periods, lasting 4-5 days. Currently using nothing for birth control. H/o Mirena, paraguard, depo use. Not currently sexually active. Would like STI testing.   Gynecologic History Patient's last menstrual period was 07/06/2017. Contraception: none, not sexually active Last Pap:01/2016, negative, reports h/o abnormal pap with "scraping of cells" Last mammogram: [facility] 1, 12/2016  Obstetric History OB History  Gravida Para Term Preterm AB Living  3 3 2 1   3   SAB TAB Ectopic Multiple Live Births          3    # Outcome Date GA Lbr Len/2nd Weight Sex Delivery Anes PTL Lv  3 Term 2010 [redacted]w[redacted]d   M    LIV  2 Term 2007 [redacted]w[redacted]d  6 lb 7 oz (2.92 kg) F    LIV  1 Preterm 2004 [redacted]w[redacted]d  4 lb 13 oz (2.183 kg) F    LIV      Past Medical History:  Diagnosis Date  . Genital warts     Past Surgical History:  Procedure Laterality Date  . LASER ABLATION OF CONDYLOMAS      Current Outpatient Medications on File Prior to Visit  Medication Sig Dispense Refill  . aspirin-acetaminophen-caffeine (EXCEDRIN EXTRA STRENGTH) 250-250-65 MG tablet Take 2 tablets by mouth every 6 (six) hours as needed for headache or  migraine.    . hydrOXYzine (ATARAX/VISTARIL) 25 MG tablet Take 1 tablet (25 mg total) every 6 (six) hours as needed by mouth for anxiety. (Patient not taking: Reported on 07/31/2017) 12 tablet 0  . naproxen sodium (ALEVE) 220 MG tablet Take 220 mg 2 (two) times daily as needed by mouth (pain).    . triamcinolone ointment (KENALOG) 0.1 % Apply 1 application topically 2 (two) times daily. (Patient not taking: Reported on 07/31/2017) 30 g 0   No current facility-administered medications on file prior to visit.     No Known Allergies  Social History   Socioeconomic History  . Marital status: Single    Spouse name: Not on file  . Number of children: Not on file  . Years of education: Not on file  . Highest education level: Not on file  Social Needs  . Financial resource strain: Not on file  . Food insecurity - worry: Not on file  . Food insecurity - inability: Not on file  . Transportation needs - medical: Not on file  . Transportation needs - non-medical: Not on file  Occupational History  . Not on file  Tobacco Use  . Smoking status: Never Smoker  . Smokeless tobacco: Never Used  Substance and Sexual Activity  . Alcohol use: No    Alcohol/week: 0.0 oz  . Drug use: No  .  Sexual activity: No    Birth control/protection: None, Abstinence  Other Topics Concern  . 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 Drug abuse Father   . Cancer Paternal Aunt        leukemia  . Hypertension Maternal Aunt   . Stroke Unknown        unknown  . Diabetes Neg Hx   . Heart disease Neg Hx     The following portions of the patient's history were reviewed and updated as appropriate: allergies, current medications, past family history, past medical history, past social history, past surgical history and problem list.  Review of Systems Pertinent  items are noted in HPI.   Objective:  BP 103/69   Pulse 86   Ht 5\' 2"  (1.575 m)   Wt 108 lb (49 kg)   LMP 07/06/2017   BMI 19.75 kg/m  CONSTITUTIONAL: Well-developed, well-nourished female in no acute distress.  HENT:  Normocephalic, atraumatic, External right and left ear normal. Oropharynx is clear and moist EYES: Conjunctivae and EOM are normal. Pupils are equal, round, and reactive to light. No scleral icterus.  NECK: Normal range of motion, supple, no masses.  Normal thyroid.  SKIN: Skin is warm and dry. No rash noted. Not diaphoretic. No erythema. No pallor. NEUROLOGIC: Alert and oriented to person, place, and time. Normal reflexes, muscle tone coordination. No cranial nerve deficit noted. PSYCHIATRIC: Normal mood and affect. Normal behavior. Normal judgment and thought content. CARDIOVASCULAR: Normal heart rate noted, regular rhythm RESPIRATORY: Clear to auscultation bilaterally. Effort and breath sounds normal, no problems with respiration noted. BREASTS: Symmetric in size. No masses, skin changes, nipple drainage, or lymphadenopathy. ABDOMEN: Soft, normal bowel sounds, no distention noted.  No tenderness, rebound or guarding.  PELVIC: Normal appearing external genitalia; normal appearing vaginal mucosa and cervix.  No abnormal discharge noted. Pubic mons appears irritated but no lesions, ulcers, broken skin noted. No rashes or excoriations noted. Area on buttocks where she indicates discomfort examined and no areas of irritation or other concern noted. Normal uterine size, no other palpable masses, no uterine or adnexal tenderness. MUSCULOSKELETAL: Normal range of motion. No tenderness.  No cyanosis, clubbing, or edema.  2+ distal pulses.   Assessment and Plan:  1. Encounter for gynecological examination with Papanicolaou smear of cervix Patient reports multiple abnormal paps in past though most recent was normal 01/2016 Will obtain today - Cytology - PAP  2. Acute left-sided  low back pain without sciatica - Urine Culture  3. Vulvar irritation No obvious areas of irritation, reviewed vulvar hygiene as below and recommended she discontinue all products to see if it improves. She will return one month for follow up and possible biopsy if no improvement Reviewed that she should wear cotton brief underwear and refrain from tight-fitting undergarments including avoiding underwear made of synthetic fabrics. Reviewed importance of controlling moisture in groin area and keeping area dry. Reviewed importance of using non-irritating soaps and laundry detergents. Reviewed using as few products in vulvar area as possible as even products designed to improve irritation can be problematic. Reviewed importance of avoiding douches, lotions or vaginal sprays as these are very irritating. Reviewed importance of avoiding scratching as much as possible as this can irritate vulvar skin.  4. Routine screening for STI (sexually transmitted infection) Does desire STI  screen - Cervicovaginal ancillary only Blood not drawn today, will obtain with return visit - Hepatitis C antibody - HIV antibody - Hepatitis B surface antigen - RPR  Will follow up results of pap/STI screen and manage accordingly.   Routine preventative health maintenance measures emphasized. Please refer to After Visit Summary for other counseling recommendations.    Feliz Beam, M.D. Attending [place], [facility] for [employer], [place]

## 2017-07-31 NOTE — Progress Notes (Signed)
Pt c/o vaginal itching with "yellowish" discharge unsure odor- no relief with OTC meds. When feeling the urge to void, pt c/o lower left back pain.

## 2017-08-01 ENCOUNTER — Encounter: Payer: Self-pay | Admitting: Obstetrics and Gynecology

## 2017-08-02 LAB — URINE CULTURE

## 2017-08-02 LAB — CERVICOVAGINAL ANCILLARY ONLY
Bacterial vaginitis: NEGATIVE
CHLAMYDIA, DNA PROBE: NEGATIVE
Candida vaginitis: NEGATIVE
NEISSERIA GONORRHEA: NEGATIVE
Trichomonas: NEGATIVE

## 2017-08-06 LAB — CYTOLOGY - PAP
DIAGNOSIS: NEGATIVE
HPV (WINDOPATH): NOT DETECTED

## 2017-08-10 ENCOUNTER — Other Ambulatory Visit: Payer: Medicaid Other

## 2017-08-11 LAB — HIV ANTIBODY (ROUTINE TESTING W REFLEX): HIV Screen 4th Generation wRfx: NONREACTIVE

## 2017-08-11 LAB — HEPATITIS C ANTIBODY

## 2017-08-11 LAB — HEPATITIS B SURFACE ANTIGEN: Hepatitis B Surface Ag: NEGATIVE

## 2017-08-11 LAB — RPR: RPR Ser Ql: NONREACTIVE

## 2017-08-15 ENCOUNTER — Other Ambulatory Visit: Payer: Self-pay | Admitting: Family Medicine

## 2017-08-15 DIAGNOSIS — R42 Dizziness and giddiness: Secondary | ICD-10-CM

## 2017-08-15 DIAGNOSIS — R002 Palpitations: Secondary | ICD-10-CM

## 2017-08-15 DIAGNOSIS — R079 Chest pain, unspecified: Secondary | ICD-10-CM

## 2017-08-16 ENCOUNTER — Ambulatory Visit (INDEPENDENT_AMBULATORY_CARE_PROVIDER_SITE_OTHER): Payer: Medicaid Other

## 2017-08-16 DIAGNOSIS — R079 Chest pain, unspecified: Secondary | ICD-10-CM | POA: Diagnosis not present

## 2017-08-16 DIAGNOSIS — R42 Dizziness and giddiness: Secondary | ICD-10-CM | POA: Diagnosis not present

## 2017-08-16 DIAGNOSIS — R002 Palpitations: Secondary | ICD-10-CM

## 2017-08-29 ENCOUNTER — Ambulatory Visit: Payer: Medicaid Other | Admitting: Obstetrics & Gynecology

## 2017-08-30 ENCOUNTER — Encounter: Payer: Self-pay | Admitting: *Deleted

## 2017-09-06 ENCOUNTER — Ambulatory Visit: Payer: Medicaid Other | Admitting: Obstetrics and Gynecology

## 2017-09-06 ENCOUNTER — Encounter: Payer: Self-pay | Admitting: Obstetrics and Gynecology

## 2017-09-06 VITALS — BP 99/67 | HR 61 | Wt 108.1 lb

## 2017-09-06 DIAGNOSIS — L292 Pruritus vulvae: Secondary | ICD-10-CM | POA: Diagnosis not present

## 2017-09-06 NOTE — Progress Notes (Signed)
   GYNECOLOGY OFFICE VISIT NOTE  History:  32 y.o. [id number] here today for follow up. She reports she feels drained constantly, has been seeing the primary care doctor and is due to return to endocrinologist. She has improved her vulvar hygiene and she reports the itching near rectum has improved, however, no improvement in itching/burning dry skin of mons and labia.  Past Medical History:  Diagnosis Date  . Genital warts     Past Surgical History:  Procedure Laterality Date  . LASER ABLATION OF CONDYLOMAS       Current Outpatient Medications:  .  aspirin-acetaminophen-caffeine (EXCEDRIN EXTRA STRENGTH) 250-250-65 MG tablet, Take 2 tablets by mouth every 6 (six) hours as needed for headache or migraine., Disp: , Rfl:  .  hydrOXYzine (ATARAX/VISTARIL) 25 MG tablet, Take 1 tablet (25 mg total) every 6 (six) hours as needed by mouth for anxiety., Disp: 12 tablet, Rfl: 0 .  naproxen sodium (ALEVE) 220 MG tablet, Take 220 mg 2 (two) times daily as needed by mouth (pain)., Disp: , Rfl:  .  triamcinolone ointment (KENALOG) 0.1 %, Apply 1 application topically 2 (two) times daily., Disp: 30 g, Rfl: 0  The following portions of the patient's history were reviewed and updated as appropriate: allergies, current medications, past family history, past medical history, past social history, past surgical history and problem list.   Health Maintenance:  Last pap: 07/2017 normal Last mammogram: normal  Review of Systems:  Pertinent items noted in HPI and remainder of comprehensive ROS otherwise negative.   Objective:  Physical Exam BP 99/67   Pulse 61   Wt 108 lb 1.6 oz (49 kg)   LMP 08/31/2017 (Approximate)   BMI 19.77 kg/m  CONSTITUTIONAL: Well-developed, well-nourished female in no acute distress.  HENT:  Normocephalic, atraumatic. External right and left ear normal. Oropharynx is clear and moist EYES: Conjunctivae and EOM are normal. Pupils are equal, round, and reactive to light. No  scleral icterus.  NECK: Normal range of motion, supple, no masses SKIN: Skin is warm and dry. No rash noted. Not diaphoretic. No erythema. No pallor. NEUROLOGIC: Alert and oriented to person, place, and time. Normal reflexes, muscle tone coordination. No cranial nerve deficit noted. PSYCHIATRIC: Normal mood and affect. Normal behavior. Normal judgment and thought content. CARDIOVASCULAR: Normal heart rate noted RESPIRATORY: Effort and breath sounds normal, no problems with respiration noted ABDOMEN: Soft, no distention noted.   PELVIC: thickened dry skin around mons symmetric in nature, one healing boil appearing area on right labia MUSCULOSKELETAL: Normal range of motion. No edema noted.  Labs and Imaging No results found.  Assessment & Plan:  1. Vulvar itching Given thickened appearance and no improvement with vulvar hygiene, recommended vulvar biopsy to rule out any infectious/dermatologic source, patient agreeable, please see separate vulvar biopsy note   Routine preventative health maintenance measures emphasized. Please refer to After Visit Summary for other counseling recommendations.   Return in about 2 weeks (around 09/20/2017).    Feliz Beam, M.D. Attending [place], [facility] for [employer], [place]

## 2017-09-06 NOTE — Progress Notes (Signed)
VULVAR BIOPSY NOTE  Caroline Jensen  The indications for vulvar biopsy (rule out neoplasia, establish lichen sclerosus diagnosis) were reviewed.  Risks of the biopsy including pain, bleeding, infection, inadequate specimen, and need for additional procedures were discussed. The patient stated understanding and agreed to undergo procedure today. Consent was signed,  time out performed.   The patient's vulva was prepped with Betadine. 1% lidocaine was injected into mons and right labia. A 4-mm punch biopsy was done at each site, biopsy tissue was picked up with sterile forceps and sterile scissors were used to excise the lesion.  Small bleeding was noted and hemostasis was achieved using silver nitrate sticks.    The patient tolerated the procedure well. Post-procedure instructions  (pelvic rest for one week) were given to the patient. The patient is to call with heavy bleeding, fever greater than 100.4, foul smelling vaginal discharge or other concerns. The patient will be return to clinic in two weeks for discussion of results.   Feliz Beam, M.D. Attending [place], [facility] for [employer], [place]

## 2017-09-18 ENCOUNTER — Encounter: Payer: Self-pay | Admitting: *Deleted

## 2017-09-20 ENCOUNTER — Ambulatory Visit (INDEPENDENT_AMBULATORY_CARE_PROVIDER_SITE_OTHER): Payer: Medicaid Other

## 2017-09-20 ENCOUNTER — Ambulatory Visit: Payer: Medicaid Other | Admitting: Interventional Cardiology

## 2017-09-20 ENCOUNTER — Encounter: Payer: Self-pay | Admitting: Obstetrics and Gynecology

## 2017-09-20 ENCOUNTER — Telehealth: Payer: Self-pay | Admitting: Family Medicine

## 2017-09-20 ENCOUNTER — Ambulatory Visit (INDEPENDENT_AMBULATORY_CARE_PROVIDER_SITE_OTHER): Payer: Medicaid Other | Admitting: Obstetrics and Gynecology

## 2017-09-20 ENCOUNTER — Encounter: Payer: Self-pay | Admitting: Interventional Cardiology

## 2017-09-20 VITALS — BP 96/58 | HR 66 | Ht 62.0 in | Wt 109.8 lb

## 2017-09-20 DIAGNOSIS — L292 Pruritus vulvae: Secondary | ICD-10-CM

## 2017-09-20 DIAGNOSIS — R002 Palpitations: Secondary | ICD-10-CM | POA: Diagnosis not present

## 2017-09-20 MED ORDER — TRIAMCINOLONE ACETONIDE 0.025 % EX OINT: 1.0000 "application " | TOPICAL_OINTMENT | Freq: Two times a day (BID) | CUTANEOUS | 0 refills | Status: DC

## 2017-09-20 NOTE — Progress Notes (Signed)
Cardiology Office Note   Date:  09/20/2017   ID:  Caroline Jensen, DOB [birth year], MRN [id number]  PCP:  Caroline More, DO    No chief complaint on file. palpitations   Wt Readings from Last 3 Encounters:  09/20/17 109 lb 12.8 oz (49.8 kg)  09/06/17 108 lb 1.6 oz (49 kg)  07/31/17 108 lb (49 kg)       History of Present Illness: Caroline Jensen is a 32 y.o. female who is being seen today for the evaluation of palpitations at the request of Caroline More, DO.  She feels that she notices her heart rate increasing and decreasing more.  Sx started a year ago.  She notes that she can feel her HR increasing with activity and at times at rest.  [facility] has some lightheadedness.  She has had several episodes a week.  Her watch measures HR up to 112; sometimes in the high 90s.    She has episodes where she feels her HR slow down.  She gets weak.  Slowest HR according to her watch is 46.    Average HR is in the 60s.    She does not do any regular exercise other than some occasional dancing.  She can feel tired with dancing.  She can have some SHOB with walking stairs.  This resolves with rest.   She only occasionally drinks coffee.     Past Medical History:  Diagnosis Date  . Genital warts     Past Surgical History:  Procedure Laterality Date  . LASER ABLATION OF CONDYLOMAS       Current Outpatient Medications  Medication Sig Dispense Refill  . aspirin-acetaminophen-caffeine (EXCEDRIN EXTRA STRENGTH) 250-250-65 MG tablet Take 2 tablets by mouth every 6 (six) hours as needed for headache or migraine.     No current facility-administered medications for this visit.     Allergies:   Patient has no known allergies.    Social History:  The patient  reports that  has never smoked. she has never used smokeless tobacco. She reports that she does not drink alcohol or use drugs.   Family History:  The patient's family history includes Cancer in her paternal aunt; Cirrhosis in her  maternal grandmother; Drug abuse in her father; Hypertension in her maternal aunt; Stroke in her unknown relative.    ROS:  Please see the history of present illness.   Otherwise, review of systems are positive for palpitations as noted above.  Occasional soda.   All other systems are reviewed and negative.    PHYSICAL EXAM: VS:  BP (!) 96/58 (BP Location: Left Arm, Patient Position: Sitting, Cuff Size: Normal)   Ht 5\' 2"  (1.575 m)   Wt 109 lb 12.8 oz (49.8 kg)   LMP 08/31/2017 (Approximate)   BMI 20.08 kg/m  , BMI Body mass index is 20.08 kg/m. GEN: Well nourished, well developed, in no acute distress  HEENT: normal  Neck: no JVD, carotid bruits, or masses Cardiac: RRR; no murmurs, rubs, or gallops,no edema  Respiratory:  clear to auscultation bilaterally, normal work of breathing GI: soft, nontender, nondistended, + BS MS: no deformity or atrophy  Skin: warm and dry, no rash Neuro:  Strength and sensation are intact Psych: euthymic mood, full affect   EKG:   The ekg ordered from November 2018 demonstrates normal ECG   Recent Labs: 12/07/2016: ALT 16 07/10/2017: BUN 6; Creatinine, Ser 0.71; Hemoglobin 11.3; Platelets 201; Potassium 3.3; Sodium 137; TSH 0.416   Lipid  Panel No results found for: CHOL, TRIG, HDL, CHOLHDL, VLDL, LDLCALC, LDLDIRECT   Other studies Reviewed: Additional studies/ records that were reviewed today with results demonstrating: Negative troponins in 11/18.   ASSESSMENT AND PLAN:  1. Palpitations: Plan for 30 day event monitor.  No evidence of structural heart disease by exam.  I suspect the results of the monitor will be something benign.  She already minimizes caffeine.  She is being managed for a subclinical thyroid issue per her report. 2. She should try to be more active on a regular basis.    Current medicines are reviewed at length with the patient today.  The patient concerns regarding her medicines were addressed.  The following changes  have been made:  No change  Labs/ tests ordered today include:  No orders of the defined types were placed in this encounter.   Recommend 150 minutes/week of aerobic exercise Low fat, low carb, high fiber diet recommended  Disposition:   FU as needed   Signed, Larae Grooms, MD  09/20/2017 9:41 AM    [facility] [place], [place], [place]  [address] Phone: [phone]; Fax: [phone]

## 2017-09-20 NOTE — Progress Notes (Signed)
   GYNECOLOGY OFFICE VISIT NOTE  History:  32 y.o. [id number] here today for follow up from biopsies for vulvar irritation. Reports ongoing irritation, no improvement in itching. Reports biopsy sites are healing well. She denies any abnormal vaginal discharge, bleeding, pelvic pain or other concerns.   Past Medical History:  Diagnosis Date  . Genital warts     Past Surgical History:  Procedure Laterality Date  . LASER ABLATION OF CONDYLOMAS       Current Outpatient Medications:  .  aspirin-acetaminophen-caffeine (EXCEDRIN EXTRA STRENGTH) 250-250-65 MG tablet, Take 2 tablets by mouth every 6 (six) hours as needed for headache or migraine., Disp: , Rfl:  .  triamcinolone (KENALOG) 0.025 % ointment, Apply 1 application topically 2 (two) times daily., Disp: 30 g, Rfl: 0  The following portions of the patient's history were reviewed and updated as appropriate: allergies, current medications, past family history, past medical history, past social history, past surgical history and problem list.   Review of Systems:  Pertinent items noted in HPI and remainder of comprehensive ROS otherwise negative.   Objective:  Physical Exam BP 103/70   Pulse 65   Ht 5\' 2"  (1.575 m)   Wt 108 lb 1.6 oz (49 kg)   LMP 08/31/2017 (Approximate)   BMI 19.77 kg/m  CONSTITUTIONAL: Well-developed, well-nourished female in no acute distress.  HENT:  Normocephalic, atraumatic. External right and left ear normal. Oropharynx is clear and moist EYES: Conjunctivae and EOM are normal. Pupils are equal, round, and reactive to light. No scleral icterus.  NECK: Normal range of motion, supple, no masses SKIN: Skin is warm and dry. No rash noted. Not diaphoretic. No erythema. No pallor. NEUROLOGIC: Alert and oriented to person, place, and time. Normal reflexes, muscle tone coordination. No cranial nerve deficit noted. PSYCHIATRIC: Normal mood and affect. Normal behavior. Normal judgment and thought  content. CARDIOVASCULAR: Normal heart rate noted RESPIRATORY: Effort normal, no problems with respiration noted ABDOMEN: Soft, no distention noted.   PELVIC: Deferred MUSCULOSKELETAL: Normal range of motion. No edema noted.  Labs and Imaging No results found.  Assessment & Plan:   1. Vulvar itching Reviewed path of vulvar biopsies with patient, notable for vulvar irritation likely due to contact dermatitis. Will start topical ointment, sent to pharmacy. Reviewed that this is not a permanent solution but attempt to get itching and irritation under control. As she has significant dermatitis all over (breast rash, itching, etc) would recommend she see immunologist or allergy specialist for testing and to address underlying dermatitisu. She will seek referral from primary care. To return 3 months or sooner if no improvement.  Routine preventative health maintenance measures emphasized. Please refer to After Visit Summary for other counseling recommendations.   Return in about 3 months (around 12/19/2017).    Feliz Beam, M.D. Attending [place], [facility] for [employer], [place]

## 2017-09-20 NOTE — Progress Notes (Signed)
Patient is in the office for follow up after 09-06-17 visit.

## 2017-09-20 NOTE — Telephone Encounter (Signed)
Pt was told by her ob dr to request a referral to an allergist or immunologist.  Pt has contact dermatitis and dr doesn't know what is causing it. Please advise

## 2017-09-20 NOTE — Patient Instructions (Signed)
Call primary care doctor and ask for referral to allergy specialist.

## 2017-09-20 NOTE — Patient Instructions (Addendum)
Medication Instructions:  Your physician recommends that you continue on your current medications as directed. Please refer to the Current Medication list given to you today.   Labwork: None ordered  Testing/Procedures: Your physician has recommended that you wear an event monitor. Event monitors are medical devices that record the heart's electrical activity. Doctors most often [place] these monitors to diagnose arrhythmias. Arrhythmias are problems with the speed or rhythm of the heartbeat. The monitor is a small, portable device. You can wear one while you do your normal daily activities. This is usually used to diagnose what is causing palpitations/syncope (passing out).  Follow-Up: Your physician recommends that you follow-up appointment AS NEEDED    Any Other Special Instructions Will Be Listed Below (If Applicable).   Cardiac Event Monitoring A cardiac event monitor is a small recording device that is used to detect abnormal heart rhythms (arrhythmias). The monitor is used to record your heart rhythm when you have symptoms, such as:  Fast heartbeats (palpitations), such as heart racing or fluttering.  Dizziness.  Fainting or light-headedness.  Unexplained weakness.  Some monitors are wired to electrodes placed on your chest. Electrodes are flat, sticky disks that attach to your skin. Other monitors may be hand-held or worn on the wrist. The monitor can be worn for up to 30 days. If the monitor is attached to your chest, a technician will prepare your chest for the electrode placement and show you how to work the monitor. Take time to practice using the monitor before you leave the office. Make sure you understand how to send the information from the monitor to your health care provider. In some cases, you may need to use a landline telephone instead of a cell phone. What are the risks? Generally, this device is safe to use, but it possible that the skin under the electrodes will  become irritated. How to use your cardiac event monitor  Wear your monitor at all times, except when you are in water: ? Do not let the monitor get wet. ? Take the monitor off when you bathe. Do not swim or use a hot tub with it on.  Keep your skin clean. Do not put body lotion or moisturizer on your chest.  Change the electrodes as told by your health care provider or any time they stop sticking to your skin. You may need to use medical tape to keep them on.  Try to put the electrodes in slightly different places on your chest to help prevent skin irritation. They must remain in the area under your left breast and in the upper right section of your chest.  Make sure the monitor is safely clipped to your clothing or in a location close to your body that your health care provider recommends.  Press the button to record as soon as you feel heart-related symptoms, such as: ? Dizziness. ? Weakness. ? Light-headedness. ? Palpitations. ? Thumping or pounding in your chest. ? Shortness of breath. ? Unexplained weakness.  Keep a diary of your activities, such as walking, doing chores, and taking medicine. It is very important to note what you were doing when you pushed the button to record your symptoms. This will help your health care provider determine what might be contributing to your symptoms.  Send the recorded information as recommended by your health care provider. It may take some time for your health care provider to process the results.  Change the batteries as told by your health care provider.  Keep electronic devices away from your monitor. This includes: ? Tablets. ? MP3 players. ? Cell phones.  While wearing your monitor you should avoid: ? Electric blankets. ? Armed forces operational officer. ? Electric toothbrushes. ? Microwave ovens. ? Magnets. ? Metal detectors. Get help right away if:  You have chest pain.  You have extreme difficulty breathing or shortness of  breath.  You develop a very fast heartbeat that persists.  You develop dizziness that does not go away.  You faint or constantly feel like you are about to faint. Summary  A cardiac event monitor is a small recording device that is used to help detect abnormal heart rhythms (arrhythmias).  The monitor is used to record your heart rhythm when you have heart-related symptoms.  Make sure you understand how to send the information from the monitor to your health care provider.  It is important to press the button on the monitor when you have any heart-related symptoms.  Keep a diary of your activities, such as walking, doing chores, and taking medicine. It is very important to note what you were doing when you pushed the button to record your symptoms. This will help your health care provider learn what might be causing your symptoms. This information is not intended to replace advice given to you by your health care provider. Make sure you discuss any questions you have with your health care provider. Document Released: 05/30/2008 Document Revised: 08/05/2016 Document Reviewed: 08/05/2016 Elsevier Interactive Patient Education  2017 [employer].    If you need a refill on your cardiac medications before your next appointment, please call your pharmacy.

## 2017-09-21 ENCOUNTER — Other Ambulatory Visit: Payer: Self-pay | Admitting: Family Medicine

## 2017-09-21 DIAGNOSIS — L309 Dermatitis, unspecified: Secondary | ICD-10-CM

## 2017-09-21 NOTE — Telephone Encounter (Signed)
Please inform patient that referral has been ordered.   Thank you,  Caroline More, DO

## 2017-09-24 NOTE — Telephone Encounter (Signed)
Pt informed. Josetta Wigal, CMA  

## 2017-10-29 ENCOUNTER — Ambulatory Visit (INDEPENDENT_AMBULATORY_CARE_PROVIDER_SITE_OTHER): Payer: Medicaid Other

## 2017-10-29 DIAGNOSIS — Z111 Encounter for screening for respiratory tuberculosis: Secondary | ICD-10-CM | POA: Diagnosis present

## 2017-10-29 NOTE — Progress Notes (Signed)
   Tuberculin skin test applied to left ventral forearm. Appt made for 10/31/17 to have read. Danley Danker, RN [facility] RN)

## 2017-10-31 ENCOUNTER — Telehealth: Payer: Self-pay | Admitting: Family Medicine

## 2017-10-31 ENCOUNTER — Other Ambulatory Visit: Payer: Medicaid Other

## 2017-10-31 ENCOUNTER — Ambulatory Visit: Payer: Medicaid Other

## 2017-10-31 ENCOUNTER — Other Ambulatory Visit: Payer: Self-pay | Admitting: Family Medicine

## 2017-10-31 DIAGNOSIS — Z111 Encounter for screening for respiratory tuberculosis: Secondary | ICD-10-CM

## 2017-10-31 NOTE — Telephone Encounter (Signed)
Pt informed and scheduled for an appt. Morningstar Toft, CMA  

## 2017-10-31 NOTE — Telephone Encounter (Signed)
This patient was originally scheduled for a TB test, but her work/school told her she actually needs a quantiferon test done. In order for her to get that done she needs orders put in by the doctor before she can schedule. Can we get these orders put in and then call her to let her know she can schedule.

## 2017-10-31 NOTE — Telephone Encounter (Signed)
Order for quantiferon gold TB assay placed. Please call patient to inform her to make appointment.   Thank you,  Caroline More, DO, PGY-1 [facility] 10/31/2017 9:59 AM

## 2017-10-31 NOTE — Telephone Encounter (Signed)
Pt would like this order placed as soon as possible. 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

## 2017-11-04 ENCOUNTER — Encounter: Payer: Self-pay | Admitting: Family Medicine

## 2017-11-04 LAB — QUANTIFERON-TB GOLD PLUS
QUANTIFERON-TB GOLD PLUS: NEGATIVE
QuantiFERON Mitogen Value: >10 IU/mL
QuantiFERON Nil Value: 0.02 IU/mL
QuantiFERON TB1 Ag Value: 0.04 IU/mL
QuantiFERON TB2 Ag Value: 0.02 IU/mL

## 2017-11-05 ENCOUNTER — Encounter: Payer: Self-pay | Admitting: *Deleted

## 2017-11-05 ENCOUNTER — Telehealth: Payer: Self-pay | Admitting: *Deleted

## 2017-11-05 NOTE — Telephone Encounter (Signed)
Pt informed and will come pick up letter today. Katelan Hirt Kennon Holter, CMA

## 2017-11-05 NOTE — Telephone Encounter (Signed)
-----   Message from Caroline More, DO sent at 11/04/2017  7:14 PM EST ----- Please inform patient of results. I can place letter in chart that she can print out when she comes for scheduled physical

## 2017-11-08 ENCOUNTER — Encounter: Payer: Medicaid Other | Admitting: Family Medicine

## 2018-03-21 ENCOUNTER — Ambulatory Visit: Payer: Medicaid Other | Admitting: Family Medicine

## 2018-03-21 VITALS — BP 108/60 | HR 65 | Temp 98.6°F | Ht 62.0 in

## 2018-03-21 DIAGNOSIS — L292 Pruritus vulvae: Secondary | ICD-10-CM | POA: Diagnosis present

## 2018-03-21 DIAGNOSIS — R350 Frequency of micturition: Secondary | ICD-10-CM

## 2018-03-21 LAB — POCT URINALYSIS DIP (MANUAL ENTRY)
BILIRUBIN UA: NEGATIVE
Glucose, UA: NEGATIVE mg/dL
Ketones, POC UA: NEGATIVE mg/dL
Leukocytes, UA: NEGATIVE
Nitrite, UA: NEGATIVE
PH UA: 6.5 (ref 5.0–8.0)
PROTEIN UA: NEGATIVE mg/dL
SPEC GRAV UA: 1.01 (ref 1.010–1.025)
Urobilinogen, UA: 0.2 E.U./dL

## 2018-03-21 LAB — POCT WET PREP (WET MOUNT)
Clue Cells Wet Prep Whiff POC: NEGATIVE
Trichomonas Wet Prep HPF POC: ABSENT

## 2018-03-21 LAB — POCT UA - MICROSCOPIC ONLY

## 2018-03-21 NOTE — Progress Notes (Signed)
    Subjective:  Caroline Jensen is a 32 y.o. female who presents to the [facility] today with a chief complaint of urinary frequency.   HPI:  Urinary frequency started this morning. No dysuria. Had some urinary urgency and also some abdominal pain after holding urine for long time over the last 1 month. Sometimes will have some associated nausea that very mild. No fever, chills, vomiting.  Does have lots of vaginal irritation and burning that is chronic. Saw OBGyn for this in Jan when she got biopsies. Was supposed to follow up with derm/allergy but never heard back.  Does have some whitish vaginal discharge. No odor. No bleeding.  ROS: Per HPI  Objective:  Physical Exam: BP 108/60   Pulse 65   Temp 98.6 F (37 C) (Oral)   Ht 5\' 2"  (1.575 m)   LMP 02/25/2018   SpO2 98%   BMI 19.77 kg/m   Gen: NAD, resting comfortably CV: RRR with no murmurs appreciated Pulm: NWOB, CTAB with no crackles, wheezes, or rhonchi. No CVA tenderness. GI: Normal bowel sounds present. Soft, Nontender, Nondistended. MSK: no edema, cyanosis, or clubbing noted Pelvic exam: normal external female genitalia, vulva, vagina, cervix, uterus and adnexa. Dry skin around external genitalia. No boils or eschar. Neuro: grossly normal, moves all extremities Psych: Normal affect and thought content  Results for orders placed or performed in visit on 03/21/18 (from the past 72 hour(s))  POCT urinalysis dipstick     Status: Abnormal   Collection Time: 03/21/18 12:00 AM  Result Value Ref Range   Color, UA yellow yellow   Clarity, UA clear clear   Glucose, UA negative negative mg/dL   Bilirubin, UA negative negative   Ketones, POC UA negative negative mg/dL   Spec Grav, UA 1.010 1.010 - 1.025   Blood, UA small (A) negative   pH, UA 6.5 5.0 - 8.0   Protein Ur, POC negative negative mg/dL   Urobilinogen, UA 0.2 0.2 or 1.0 E.U./dL   Nitrite, UA Negative Negative   Leukocytes, UA Negative Negative  POCT UA - Microscopic Only      Status: None   Collection Time: 03/21/18 12:00 AM  Result Value Ref Range   WBC, Ur, HPF, POC NONE    RBC, urine, microscopic 0-3    Bacteria, U Microscopic NONE    Epithelial cells, urine per micros 0-3   POCT Wet Prep [facility])     Status: None   Collection Time: 03/21/18  3:52 PM  Result Value Ref Range   Source Wet Prep POC VAG    WBC, Wet Prep HPF POC 1-5    Bacteria Wet Prep HPF POC Few Few   Clue Cells Wet Prep HPF POC None None   Clue Cells Wet Prep Whiff POC Negative Whiff    Yeast Wet Prep HPF POC None None   KOH Wet Prep POC None None   Trichomonas Wet Prep HPF POC Absent Absent     Assessment/Plan:  Vulvar itching Discussed that given negative UA and wet prep that urinary frequency could be secondary to irritation from vulvar itching. Patient had biopsies done in Jan 2019 that showed vulvar irritation secondary to contact dermatitis. She unfortunately never heard about her referral, referral to allergy resent today.    Bufford Lope, DO PGY-3, [facility] 03/21/2018 3:32 PM

## 2018-03-21 NOTE — Patient Instructions (Signed)
Someone will call you with the results of the wet prep but this is most likely secondary to the vulvar irritation.  Please call the office if you have not heard back about the allergist referral after 2 weeks.

## 2018-03-22 NOTE — Assessment & Plan Note (Signed)
Discussed that given negative UA and wet prep that urinary frequency could be secondary to irritation from vulvar itching. Patient had biopsies done in Jan 2019 that showed vulvar irritation secondary to contact dermatitis. She unfortunately never heard about her referral, referral to allergy resent today.

## 2018-03-29 ENCOUNTER — Ambulatory Visit (INDEPENDENT_AMBULATORY_CARE_PROVIDER_SITE_OTHER): Payer: Medicaid Other | Admitting: Allergy

## 2018-03-29 ENCOUNTER — Encounter: Payer: Self-pay | Admitting: Allergy

## 2018-03-29 VITALS — BP 90/60 | HR 66 | Temp 98.4°F | Resp 18 | Ht 63.3 in | Wt 105.8 lb

## 2018-03-29 DIAGNOSIS — J309 Allergic rhinitis, unspecified: Secondary | ICD-10-CM

## 2018-03-29 DIAGNOSIS — L2489 Irritant contact dermatitis due to other agents: Secondary | ICD-10-CM

## 2018-03-29 DIAGNOSIS — H1010 Acute atopic conjunctivitis, unspecified eye: Secondary | ICD-10-CM | POA: Diagnosis not present

## 2018-03-29 MED ORDER — OLOPATADINE HCL 0.7 % OP SOLN
1.0000 [drp] | OPHTHALMIC | 5 refills | Status: DC
Start: 2018-03-29 — End: 2018-08-12

## 2018-03-29 MED ORDER — AZELASTINE HCL 0.1 % NA SOLN: 2.0000 | Freq: Two times a day (BID) | NASAL | 5 refills | Status: DC

## 2018-03-29 MED ORDER — FLUTICASONE PROPIONATE 50 MCG/ACT NA SUSP: 2.0000 | Freq: Every day | NASAL | 5 refills | Status: DC

## 2018-03-29 NOTE — Progress Notes (Signed)
New Patient Note  RE: Caroline Jensen MRN: [id number] DOB: May 05, 1986 Date of Office Visit: 03/29/2018  Referring provider: Bufford Lope, DO Primary care provider: Caroline More, DO  Chief Complaint: rash  History of present illness: Caroline Jensen is a 32 y.o. female presenting today for consultation for vulvar itching likely secondary to contact dermatitis.   She states she has been having issues with her skin.  She states she would get "little bumps" on her fingers.  She also reports having rash on her shoulder area, chest and "bikini line" area that is itchy.  She has been prescribed triamcinolone which she has been using for her chest and vaginal area.  She initially felt the triamcinolone was helpful but states the rash seems to continue to flare.  She has been using HC now however more for maintenance.  She moisturizes with Eucerin.    The vulvar itching has been ongoing for months and saw her OB who did a biopsy in January 2019.  The biopsy showed "hyperkeratosis and mild papillary epidermal hyperplasia associated with a lymphocytic infiltrate.  Considerations in this setting are an early seborrheic keratosis or reactive epidermal hyperplasia such as an evolving prurigo nodule. There are changes that suggest that this lesion has been previously irritated. There is focal pigmentation which may account for the clinical findings. There is focal fibrosis. This suggests the possibility of previous injury at this site. There is pigment incontinence with scattered melanophages noted in the superficial dermis. SOX10 does not demonstrate an atypical melanocytic proliferation. The control slides stained appropriately. Multiple sections have been examined. These changes may represent a long standing irritant dermatitis such as a contact dermatitis."  She states even wearing her cotton underwear seems to exacerbate the rash and itch.  She denies using any special products in her genital area.  She states  she has changed her detergents to die and scent free products and bathes with Dove white soap and is only using Eucerin for moisturization at this time.  Due to concern of possible irritant/contact dermatitis she was referred to see me today.    She has no history of eczema, asthma, food allergy.   She does have seasonal allergy symptoms of nasal congestion/drainage with nasal drainage, sneezing, itchy/watery eyes.  This spring has been worse season for her.  She did by OTC allergy medication but state she only took like 2-3 days worth.     Review of systems: Review of Systems  Constitutional: Negative for chills, fever and malaise/fatigue.  HENT: Positive for congestion. Negative for ear discharge, ear pain, nosebleeds, sinus pain and sore throat.   Eyes: Negative for pain, discharge and redness.  Respiratory: Negative for cough, shortness of breath and wheezing.   Gastrointestinal: Negative for abdominal pain, constipation, diarrhea, heartburn, nausea and vomiting.  Musculoskeletal: Negative for joint pain.  Skin: Positive for itching and rash.  Neurological: Negative for headaches.    All other systems negative unless noted above in HPI  Past medical history: Past Medical History:  Diagnosis Date  . Genital warts     Past surgical history: Past Surgical History:  Procedure Laterality Date  . LASER ABLATION OF CONDYLOMAS      Family history:  Family History  Problem Relation Age of Onset  . Drug abuse Father   . Cancer Paternal Aunt        leukemia  . Hypertension Maternal Aunt   . Stroke Unknown        unknown  .  Cirrhosis Maternal Grandmother   . Urticaria Brother   . Eczema Son   . Diabetes Neg Hx   . Heart disease Neg Hx   . Allergic rhinitis Neg Hx   . Asthma Neg Hx     Social history: Lives ina townhome with carpeting with electric heating and central cooling.  No pets in the home.  No concern for water damage, mildew or roaches in the home.  She is a  Oceanographer.  Denies smoking history.    Medication List: Allergies as of 03/29/2018   No Known Allergies     Medication List        Accurate as of 03/29/18  1:19 PM. Always use your most recent med list.          azelastine 0.1 % nasal spray Commonly known as:  ASTELIN Place 2 sprays into both nostrils 2 (two) times daily.   fluticasone 50 MCG/ACT nasal spray Commonly known as:  FLONASE Place 2 sprays into both nostrils daily.   Olopatadine HCl 0.7 % Soln Commonly known as:  PAZEO Place 1 drop into both eyes 1 day or 1 dose.   triamcinolone 0.025 % ointment Commonly known as:  KENALOG Apply 1 application topically 2 (two) times daily.       Known medication allergies: No Known Allergies   Physical examination: Blood pressure 90/60, pulse 66, temperature 98.4 F (36.9 C), temperature source Oral, resp. rate 18, height 5' 3.3" (1.608 m), weight 105 lb 12.8 oz (48 kg), SpO2 97 %.  General: Alert, interactive, in no acute distress. HEENT: PERRLA, TMs pearly gray, turbinates moderately edematous without discharge, post-pharynx non erythematous. Neck: Supple without lymphadenopathy. Lungs: Clear to auscultation without wheezing, rhonchi or rales. {no increased work of breathing. CV: Normal S1, S2 without murmurs. Abdomen: Nondistended, nontender. Skin: Dry, mildly hyperpigmented, mildly thickened patches on the right upper chest and b/l shoulders. Extremities:  No clubbing, cyanosis or edema. Neuro:   Grossly intact.  Diagnositics/Labs:  Allergy testing: environmental allergy skin prick testing is positive to sheep Sorrell Intradermal testing is equivocal to mold mix 2 Allergy testing results were read and interpreted by provider, documented by clinical staff.   Assessment and plan:   Irritant/contact dermatitis   - biopsy of affected skin is concerning for contact dermatitis   - contact dermatitis discussed today and testing modality is patch testing.   TRUE test patch panels has many different chemicals/compounds that can cause rash with exposure if allergic to the chemical/compound.   If you have certain products that you are concerned about like a shampoo or body product you can bring in the product for patch testing as well.   Patch testing is best done on a Monday for placement of patches and then you would return on Wednesday and Friday of same week for readings.   Schedule for patch placement when you are available to return for these visits in same week.      - continue use of topical steroids like hydrocortisone for milder flares and triamcinolone for more severe flares as needed    - daily moisturization  Allergic rhinoconjunctivitis    - environmental allergy skin testing today is positive to sheep sorrel weed and equivocal to mold    - for nasal congestion/stuffiness recommend use of a nasal steroid spray like Flonase 2 sprays each nostril daily and use for 1-2 weeks before stopping once symptoms improve    - for nasal drainage/post-nasal drip recommend use of a nasal  antihistamine like Astelin 2 sprays each nostril 1-2 times a day    - for itchy/watery/red eyes recommend use of Pazeo 1 drop each eye daily as needed    - a long-acting antihistamine like Zyrtec 10mg  or Xyzal 5mg  daily as needed  Follow-up for patch test placement on a Monday  I appreciate the opportunity to take part in Caroline Jensen's care. Please do not hesitate to contact me with questions.  Sincerely,   Prudy Feeler, MD Allergy/Immunology [facility]

## 2018-03-29 NOTE — Patient Instructions (Addendum)
Irritant/contact dermatitis   - biopsy of affected skin is concerning for contact dermatitis   - contact dermatitis discussed today and testing modality is patch testing.  TRUE test patch panels has many different chemicals/compounds that can cause rash with exposure if allergic to the chemical/compound.   If you have certain products that you are concerned about like a shampoo or body product you can bring in the product for patch testing as well.   Patch testing is best done on a Monday for placement of patches and then you would return on Wednesday and Friday of same week for readings.   Schedule for patch placement when you are available to return for these visits in same week.      - continue use of topical steroids like hydrocortisone for milder flares and triamcinolone for more severe flares as needed    - daily moisturization  Allergic rhinoconjunctivitis    - environmental allergy skin testing today is positive to sheep sorrel weed and equivocal to mold    - for nasal congestion/stuffiness recommend use of a nasal steroid spray like Flonase 2 sprays each nostril daily and use for 1-2 weeks before stopping once symptoms improve    - for nasal drainage/post-nasal drip recommend use of a nasal antihistamine like Astelin 2 sprays each nostril 1-2 times a day    - for itchy/watery/red eyes recommend use of Pazeo 1 drop each eye daily as needed    - a long-acting antihistamine like Zyrtec 10mg  or Xyzal 5mg  daily as needed  Follow-up for patch test placement on a Monday

## 2018-04-08 ENCOUNTER — Encounter: Payer: Self-pay | Admitting: Allergy

## 2018-04-08 ENCOUNTER — Ambulatory Visit (INDEPENDENT_AMBULATORY_CARE_PROVIDER_SITE_OTHER): Payer: Medicaid Other | Admitting: Allergy

## 2018-04-08 VITALS — BP 100/68 | HR 62 | Resp 18

## 2018-04-08 DIAGNOSIS — L2489 Irritant contact dermatitis due to other agents: Secondary | ICD-10-CM

## 2018-04-08 NOTE — Progress Notes (Signed)
    Follow-up Note  RE: Alizay Bronkema MRN: [id number] DOB: July 26, 1986 Date of Office Visit: 04/08/2018  Primary care provider: Caroline More, DO Referring provider: Bufford Lope, DO   Electa returns to the office today for the patch test placement, given suspected history of contact dermatitis.  She has been doing well since last visit.  Has not had any systemic steroids since last visit.     Limited PE: Blood pressure 100/68, pulse 62, resp. rate 18, SpO2 98 %.   Diagnostics:  TRUE TEST patch panels placed, marked and taped on back   Plan:  Contact dermatitis   -Patient to return in 2 days for first reading and then again 2 days after that for final reading.  Patient advised to not get the patches wet.  She can take antihistamines if needed for itch control.  Prudy Feeler, MD [facility]

## 2018-04-10 ENCOUNTER — Ambulatory Visit: Payer: Medicaid Other | Admitting: Allergy & Immunology

## 2018-04-10 ENCOUNTER — Encounter: Payer: Self-pay | Admitting: Allergy & Immunology

## 2018-04-10 DIAGNOSIS — L2489 Irritant contact dermatitis due to other agents: Secondary | ICD-10-CM

## 2018-04-10 NOTE — Patient Instructions (Addendum)
1. Testing was positive to Wool Alcohols, Diazolidinyl urea, and Gold sodium thiosulfate 2. Testing was equivocal (possibly reactive) to: Balsam of [place], Thiuram mix, and Thiomersal 2. Informational sheets provided. 3. Return in two days for another reading.

## 2018-04-10 NOTE — Progress Notes (Signed)
    Follow-up Note  RE: Caroline Jensen MRN: [id number] DOB: Aug 01, 1986 Date of Office Visit: 04/10/2018  Primary care provider: Caroline More, DO Referring provider: Caroline More, DO   [name] returns to the office today for the initial patch test interpretation, given suspected history of contact dermatitis.    Diagnostics:   TRUE TEST 48-hour hour reading: mild reaction to #2 (Wool Alcohols), equivocal reaction to #10 (Balsam of [place]), equivocal reaction to #23 (Thiomersal), equivocal reaction to #24 (Thiuram mix), mild reaction to #25 (Diazolidinyl urea) and mild reaction to #28 (Gold sodium thiosulfate)  Plan:   Allergic contact dermatitis - The patient has been provided detailed information regarding the substances she is sensitive to, as well as products containing the substances.   - Meticulous avoidance of these substances is recommended.  - If avoidance is not possible, the use of barrier creams or lotions is recommended. - If symptoms persist or progress despite meticulous avoidance of the above chemicals, Dermatology Referral may be warranted.   Salvatore Marvel, MD  [facility]

## 2018-04-12 ENCOUNTER — Ambulatory Visit: Payer: Medicaid Other | Admitting: Allergy

## 2018-04-12 ENCOUNTER — Encounter: Payer: Self-pay | Admitting: Allergy

## 2018-04-12 DIAGNOSIS — L2489 Irritant contact dermatitis due to other agents: Secondary | ICD-10-CM

## 2018-04-12 NOTE — Progress Notes (Signed)
    Follow-up Note  RE: Caroline Jensen MRN: [id number] DOB: 1986/02/28 Date of Office Visit: 04/12/2018  Primary care provider: Caroline More, DO Referring provider: Caroline More, DO   [name] returns to the office today for the final patch test interpretation, given suspected history of contact dermatitis.    Diagnostics:  TRUE TEST 96 hour reading: weak positive to #10 (Balsam of [place])  TRUE TEST 48-hour hour reading: mild reaction to #2 (Wool Alcohols), equivocal reaction to #10 (Balsam of [place]), equivocal reaction to #23 (Thiomersal), equivocal reaction to #24 (Thiuram mix), mild reaction to #25 (Diazolidinyl urea) and mild reaction to #28 (Gold sodium thiosulfate)   Plan:  Allergic contact dermatitis  The patient has been provided detailed information regarding the substances she is sensitive to, as well as products containing the substances.  Meticulous avoidance of these substances is recommended. If avoidance is not possible, the use of barrier creams or lotions is recommended. If symptoms persist or progress despite meticulous avoidance of above substances, dermatology evaluation may be warranted.  Prudy Feeler, MD [facility]

## 2018-04-17 DIAGNOSIS — L700 Acne vulgaris: Secondary | ICD-10-CM | POA: Diagnosis not present

## 2018-04-17 DIAGNOSIS — L239 Allergic contact dermatitis, unspecified cause: Secondary | ICD-10-CM | POA: Diagnosis not present

## 2018-05-07 ENCOUNTER — Other Ambulatory Visit: Payer: Self-pay | Admitting: Allergy

## 2018-05-07 NOTE — Telephone Encounter (Signed)
Patient needs a script for ZYRTEC sent in to [facility] on [address] Patient was only given samples and a script was never sent in

## 2018-05-08 MED ORDER — CETIRIZINE HCL 10 MG PO TABS: 10.0000 mg | ORAL_TABLET | Freq: Every day | ORAL | 5 refills | Status: DC

## 2018-05-08 NOTE — Telephone Encounter (Signed)
Prescription has been sent in as requested and message left for patient advising of this.

## 2018-05-29 DIAGNOSIS — L700 Acne vulgaris: Secondary | ICD-10-CM | POA: Diagnosis not present

## 2018-05-29 DIAGNOSIS — L209 Atopic dermatitis, unspecified: Secondary | ICD-10-CM | POA: Diagnosis not present

## 2018-06-08 DIAGNOSIS — Z23 Encounter for immunization: Secondary | ICD-10-CM | POA: Diagnosis not present

## 2018-08-12 ENCOUNTER — Encounter (HOSPITAL_COMMUNITY): Payer: Self-pay | Admitting: Emergency Medicine

## 2018-08-12 ENCOUNTER — Ambulatory Visit (HOSPITAL_COMMUNITY)
Admission: EM | Admit: 2018-08-12 | Discharge: 2018-08-12 | Disposition: A | Discharge status: Home / Self Care | Payer: Medicaid Other | Attending: Internal Medicine | Admitting: Internal Medicine

## 2018-08-12 ENCOUNTER — Other Ambulatory Visit: Payer: Self-pay

## 2018-08-12 DIAGNOSIS — S161XXA Strain of muscle, fascia and tendon at neck level, initial encounter: Secondary | ICD-10-CM | POA: Diagnosis not present

## 2018-08-12 MED ORDER — CYCLOBENZAPRINE HCL 10 MG PO TABS: 10.0000 mg | ORAL_TABLET | Freq: Three times a day (TID) | ORAL | 0 refills | Status: DC

## 2018-08-12 MED ORDER — DICLOFENAC SODIUM 75 MG PO TBEC: 75.0000 mg | DELAYED_RELEASE_TABLET | Freq: Two times a day (BID) | ORAL | 0 refills | Status: DC
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
## 2018-08-12 NOTE — Discharge Instructions (Addendum)
Use ice and heat for 15 min at a time 2-4 times a day for 5-7 days Follow up with your family Dr within 5-7 days

## 2018-11-04 ENCOUNTER — Encounter: Payer: Self-pay | Admitting: Family Medicine

## 2018-11-04 ENCOUNTER — Ambulatory Visit (INDEPENDENT_AMBULATORY_CARE_PROVIDER_SITE_OTHER): Payer: Medicaid Other | Admitting: Family Medicine

## 2018-11-04 ENCOUNTER — Other Ambulatory Visit: Payer: Self-pay

## 2018-11-04 VITALS — BP 92/62 | HR 61 | Temp 98.7°F | Ht 63.0 in | Wt 105.8 lb

## 2018-11-04 DIAGNOSIS — R3915 Urgency of urination: Secondary | ICD-10-CM

## 2018-11-04 LAB — POCT UA - MICROSCOPIC ONLY

## 2018-11-04 LAB — POCT URINALYSIS DIP (MANUAL ENTRY)
Bilirubin, UA: NEGATIVE
Glucose, UA: NEGATIVE mg/dL
Ketones, POC UA: NEGATIVE mg/dL
Nitrite, UA: NEGATIVE
Protein Ur, POC: NEGATIVE mg/dL
Spec Grav, UA: 1.025 (ref 1.010–1.025)
UROBILINOGEN UA: 0.2 U/dL
pH, UA: 7 (ref 5.0–8.0)

## 2018-11-04 LAB — POCT URINE PREGNANCY: Preg Test, Ur: NEGATIVE

## 2018-11-04 MED ORDER — CEPHALEXIN 500 MG PO CAPS: 500.0000 mg | ORAL_CAPSULE | Freq: Two times a day (BID) | ORAL | 0 refills | Status: DC

## 2018-11-04 NOTE — Patient Instructions (Addendum)
Call the heart doctor to follow up on your palpitations [phone]  Sent in medication for you to take for possible UTI Call if you have increase in discharge or itching with the antibiotic and we can prescribe something for a yeast infection  Be well, Dr. Ardelia Mems     Urinary Tract Infection, Adult  A urinary tract infection (UTI) is an infection of any part of the urinary tract. The urinary tract includes the kidneys, ureters, bladder, and urethra. These organs make, store, and get rid of urine in the body. Your health care provider may use other names to describe the infection. An upper UTI affects the ureters and kidneys (pyelonephritis). A lower UTI affects the bladder (cystitis) and urethra (urethritis). What are the causes? Most urinary tract infections are caused by bacteria in your genital area, around the entrance to your urinary tract (urethra). These bacteria grow and cause inflammation of your urinary tract. What increases the risk? You are more likely to develop this condition if:  You have a urinary catheter that stays in place (indwelling).  You are not able to control when you urinate or have a bowel movement (you have incontinence).  You are female and you: ? Use a spermicide or diaphragm for birth control. ? Have low estrogen levels. ? Are pregnant.  You have certain genes that increase your risk (genetics).  You are sexually active.  You take antibiotic medicines.  You have a condition that causes your flow of urine to slow down, such as: ? An enlarged prostate, if you are female. ? Blockage in your urethra (stricture). ? A kidney stone. ? A nerve condition that affects your bladder control (neurogenic bladder). ? Not getting enough to drink, or not urinating often.  You have certain medical conditions, such as: ? Diabetes. ? A weak disease-fighting system (immunesystem). ? Sickle cell disease. ? Gout. ? Spinal cord injury. What are the signs or  symptoms? Symptoms of this condition include:  Needing to urinate right away (urgently).  Frequent urination or passing small amounts of urine frequently.  Pain or burning with urination.  Blood in the urine.  Urine that smells bad or unusual.  Trouble urinating.  Cloudy urine.  Vaginal discharge, if you are female.  Pain in the abdomen or the lower back. You may also have:  Vomiting or a decreased appetite.  Confusion.  Irritability or tiredness.  A fever.  Diarrhea. The first symptom in older adults may be confusion. In some cases, they may not have any symptoms until the infection has worsened. How is this diagnosed? This condition is diagnosed based on your medical history and a physical exam. You may also have other tests, including:  Urine tests.  Blood tests.  Tests for sexually transmitted infections (STIs). If you have had more than one UTI, a cystoscopy or imaging studies may be done to determine the cause of the infections. How is this treated? Treatment for this condition includes:  Antibiotic medicine.  Over-the-counter medicines to treat discomfort.  Drinking enough water to stay hydrated. If you have frequent infections or have other conditions such as a kidney stone, you may need to see a health care provider who specializes in the urinary tract (urologist). In rare cases, urinary tract infections can cause sepsis. Sepsis is a life-threatening condition that occurs when the body responds to an infection. Sepsis is treated in the hospital with IV antibiotics, fluids, and other medicines. Follow these instructions at home:  Medicines  Take over-the-counter  and prescription medicines only as told by your health care provider.  If you were prescribed an antibiotic medicine, take it as told by your health care provider. Do not stop using the antibiotic even if you start to feel better. General instructions  Make sure you: ? Empty your bladder  often and completely. Do not hold urine for long periods of time. ? Empty your bladder after sex. ? Wipe from front to back after a bowel movement if you are female. Use each tissue one time when you wipe.  Drink enough fluid to keep your urine pale yellow.  Keep all follow-up visits as told by your health care provider. This is important. Contact a health care provider if:  Your symptoms do not get better after 1-2 days.  Your symptoms go away and then return. Get help right away if you have:  Severe pain in your back or your lower abdomen.  A fever.  Nausea or vomiting. Summary  A urinary tract infection (UTI) is an infection of any part of the urinary tract, which includes the kidneys, ureters, bladder, and urethra.  Most urinary tract infections are caused by bacteria in your genital area, around the entrance to your urinary tract (urethra).  Treatment for this condition often includes antibiotic medicines.  If you were prescribed an antibiotic medicine, take it as told by your health care provider. Do not stop using the antibiotic even if you start to feel better.  Keep all follow-up visits as told by your health care provider. This is important. This information is not intended to replace advice given to you by your health care provider. Make sure you discuss any questions you have with your health care provider. Document Released: 05/31/2005 Document Revised: 02/28/2018 Document Reviewed: 02/28/2018 Elsevier Interactive Patient Education  2019 [employer].

## 2018-11-04 NOTE — Progress Notes (Signed)
Date of Visit: 11/04/2018   HPI:  Patient presents to discuss urinary frequency.  Urinary symptoms - for the last month or so has had urinary freuqency and hesitancy. Feels like she has the bladder of an 70mo pregnant person, has to go often and when she goes just a few dribbles of urine come out. Also notes some pelvic discomfort/pressure at night. Has not sought care before now. No upper back pain, does note some low back pain for 2-3 months that does not really concern her very much. No fevers. Not sexually active, patient has no concerns for STDs  Palpitations - brought up by patient at very end of visit. Seen previously by cardiology for palpitations, had unremarkable 30 day event monitor over 1 year ago. Still having intermittent palpitations.  Also notes chronic vulvar itching and rash, previously with biopsies and saw allergist.  ROS: See HPI.  [place]: history of palpitations, hyperthyroidism, allergic rhinitis  PHYSICAL EXAM: BP 92/62   Pulse 61   Temp 98.7 F (37.1 C) (Oral)   Ht 5\' 3"  (1.6 m)   Wt 105 lb 12.8 oz (48 kg)   SpO2 99%   BMI 18.74 kg/m   LMP 10/15/2018  Gen: no acute distress, pleasant, cooperative, well appearing HEENT: normocephalic, atraumatic, moist mucous membranes  Heart: regular rate and rhythm, no murmur Lungs: clear to auscultation bilaterally, normal work of breathing  Neuro: alert, grossly nonfocal, speech normal Abdomen: soft, no masses or organomegaly. + mildly tender to palpation in suprapubic area  ASSESSMENT/PLAN:  Health maintenance:  -current on HM items (entered flu shot historical admin)  Urinary frequency Symptoms & exam suggestive of UTI. Will send urine for culture. Start keflex 500mg  twice daily for 7 days. Follow up if not improving. Discussed signs/sx of yeast infection should she develop that with keflex, advised we can send in rx for medication to treat.  Palpitations Provided phone # for patient to call & schedule follow up  with cardiology given she has ongoing palpitations with unremarkable event monitor over 1 year ago. Regular rate and rhythm on exam today.  Recommend patient follow up with PCP for chronic vulvar issues and neck pain (separate issues mentioned by patient that we did not have time to address today).   FOLLOW UP: Follow up with PCP for above issues Patient to call cardiology for appointment   [place]. Ardelia Mems, [place]

## 2018-11-06 LAB — URINE CULTURE

## 2018-11-26 ENCOUNTER — Telehealth (INDEPENDENT_AMBULATORY_CARE_PROVIDER_SITE_OTHER): Payer: Medicaid Other | Admitting: Student in an Organized Health Care Education/Training Program

## 2018-11-26 ENCOUNTER — Other Ambulatory Visit: Payer: Self-pay

## 2018-11-26 DIAGNOSIS — J069 Acute upper respiratory infection, unspecified: Secondary | ICD-10-CM | POA: Diagnosis not present

## 2018-11-26 NOTE — Progress Notes (Signed)
[facility] Telemedicine Visit  Patient consented to have visit conducted via telephone.  Encounter participants: Patient: Caroline Jensen  Provider: Richarda Osmond   Chief Complaint: cough x2-3 days  HPI: Patient endorses 2 to 3 weeks of runny nose with clear to yellow mucus which she attribute to allergies originally.  In the past 2 to 3 days she has developed sneezing and cough with mild sore throat.  Denies any sputum or headache or sinus pain.  Oral temperature today is 98.9.  Denies difficulties with swallowing or breathing.  She has been drinking plenty of fluids. has not tried over-the-counter medications.  Both her son and daughter were sick and coughing in the days before her illness started.  Both of their symptoms have improved and they remained afebrile.  Patient denies any traveling.  Allergies runny nose mucous- x2-3 days- yellow and blood Denies history of asthma or other respiratory diseases.   ROS: denies GI symptoms/fever  Pertinent PMHx: allergies.   Assessment/Plan:  Viral upper respiratory infection -Continue to monitor temperature and call back if develops a fever -Continue to drink plenty of fluids, rest, and try to isolate -Call back if symptoms not improving in the next couple of days  Time spent on phone with patient: 12 minutes

## 2019-01-03 DIAGNOSIS — J069 Acute upper respiratory infection, unspecified: Secondary | ICD-10-CM | POA: Insufficient documentation

## 2019-01-31 ENCOUNTER — Other Ambulatory Visit: Payer: Self-pay

## 2019-01-31 ENCOUNTER — Ambulatory Visit (INDEPENDENT_AMBULATORY_CARE_PROVIDER_SITE_OTHER): Payer: Medicaid Other | Admitting: Family Medicine

## 2019-01-31 VITALS — BP 90/58 | HR 66

## 2019-01-31 DIAGNOSIS — R21 Rash and other nonspecific skin eruption: Secondary | ICD-10-CM | POA: Insufficient documentation

## 2019-01-31 LAB — POCT SKIN KOH: Skin KOH, POC: NEGATIVE

## 2019-01-31 MED ORDER — CLOBETASOL PROPIONATE 0.05 % EX CREA: 1.0000 "application " | TOPICAL_CREAM | Freq: Two times a day (BID) | CUTANEOUS | 0 refills | Status: DC

## 2019-01-31 NOTE — Patient Instructions (Addendum)
Stop triamcinolone. Try clobetasol - it is a much stronger steroid cream.  Moisturize 4-5 times a day. Try vaseline as well.  Referral to dermatology was placed today. You can always call back and ask about the status of your referral.

## 2019-01-31 NOTE — Progress Notes (Signed)
    Subjective:  Caroline Jensen is a 33 y.o. female who presents to the [facility] today with a chief complaint of rash.   HPI:  Has had a rash over her mons area has been ongoing for the last 1 year. She has seen an allergist and undergone patch testing as suspected contact dermatitis.  She states that her rash got improved for awhile with use of topical triamcinolone and Eucerin cream at home.  She has been using only fragrance and dye free detergents and soaps.  She moisturizes with Eucerin for eczema once daily.  She is using triamcinolone cream twice daily.  She feels like the rash has started to get worse lately.  It is itchy.  There is no redness or drainage.  She has no systemic symptoms.  No fever.  ROS: Per HPI   Objective:  Physical Exam: BP (!) 90/58   Pulse 66   LMP 01/27/2019 (Exact Date)   SpO2 98%   Gen: NAD, resting comfortably Skin: Dry, slightly hyperpigmented and thickened patches in pubic hair area and upper mons.  No erythema.  No drainage. Neuro: grossly normal, moves all extremities Psych: Normal affect and thought content  Results for orders placed or performed in visit on 01/31/19 (from the past 72 hour(s))  POCT Skin KOH     Status: None   Collection Time: 01/31/19  2:10 PM  Result Value Ref Range   Skin KOH, POC Negative Negative     Assessment/Plan:  Rash Patient has been dealing with suspected contact dermatitis for over the last 1 year.  She had a skin biopsy done by OB/GYN that showed "hyperkeratosis and mild papillary epidermal hyperplasia associated with a lymphocytic infiltrate.  Considerations in this setting are an early seborrheic keratosis or reactive epidermal hyperplasia such as an evolving prurigo nodule" that allergy thought was consistent with contact dermatitis.  She had patch testing done last summer as well.  KOH skin scraping -3 yeast.  Discussed aggressive use of emollients to protect skin barrier and increased strength of topical steroid cream  given today.  Referral to dermatology placed as well.    Orders Placed This Encounter  Procedures  . Ambulatory referral to Dermatology    Referral Priority:   Routine    Referral Type:   Consultation    Referral Reason:   Specialty Services Required    Requested Specialty:   Dermatology    Number of Visits Requested:   1  . POCT Skin KOH    Meds ordered this encounter  Medications  . clobetasol cream (TEMOVATE) 0.05 %    Sig: Apply 1 application topically 2 (two) times daily.    Dispense:  30 g    Refill:  [place], DO PGY-3, [facility] 01/31/2019 2:07 PM

## 2019-01-31 NOTE — Assessment & Plan Note (Signed)
Patient has been dealing with suspected contact dermatitis for over the last 1 year.  She had a skin biopsy done by OB/GYN that showed "hyperkeratosis and mild papillary epidermal hyperplasia associated with a lymphocytic infiltrate.  Considerations in this setting are an early seborrheic keratosis or reactive epidermal hyperplasia such as an evolving prurigo nodule" that allergy thought was consistent with contact dermatitis.  She had patch testing done last summer as well.  KOH skin scraping -3 yeast.  Discussed aggressive use of emollients to protect skin barrier and increased strength of topical steroid cream given today.  Referral to dermatology placed as well.

## 2019-03-12 DIAGNOSIS — L700 Acne vulgaris: Secondary | ICD-10-CM | POA: Diagnosis not present

## 2019-03-12 DIAGNOSIS — L209 Atopic dermatitis, unspecified: Secondary | ICD-10-CM | POA: Diagnosis not present

## 2019-04-12 DIAGNOSIS — H16223 Keratoconjunctivitis sicca, not specified as Sjogren's, bilateral: Secondary | ICD-10-CM | POA: Diagnosis not present

## 2019-04-12 DIAGNOSIS — H40033 Anatomical narrow angle, bilateral: Secondary | ICD-10-CM | POA: Diagnosis not present

## 2019-05-24 DIAGNOSIS — H1013 Acute atopic conjunctivitis, bilateral: Secondary | ICD-10-CM | POA: Diagnosis not present

## 2019-05-25 DIAGNOSIS — H5213 Myopia, bilateral: Secondary | ICD-10-CM | POA: Diagnosis not present

## 2019-08-04 ENCOUNTER — Other Ambulatory Visit: Payer: Self-pay

## 2019-08-04 DIAGNOSIS — R21 Rash and other nonspecific skin eruption: Secondary | ICD-10-CM

## 2019-08-04 MED ORDER — CLOBETASOL PROPIONATE 0.05 % EX CREA: 1.0000 "application " | TOPICAL_CREAM | Freq: Two times a day (BID) | CUTANEOUS | 0 refills | Status: DC

## 2019-08-08 ENCOUNTER — Telehealth: Payer: Self-pay

## 2019-08-08 NOTE — Telephone Encounter (Signed)
Caller advise that result are not in chart yet

## 2019-11-10 NOTE — Progress Notes (Signed)
Subjective:  CC -- Annual Physical; With complaints of pelvic pain, vaginal discharge, need for TB testing   Pelvic pain Patient presents for concerns of pelvic pain. States that she has had urinary frequency as well. Reports urinary urgency. Does report incontinence related to urgency. Notes some nausea as well. Denies hematuria. Does report vaginal discharge. Denies dysuria. Has history of 3 vaginal births.   Vaginal discharge Onset: "A while"   Description: white, yellow, thin   Odor: sweat odor    Itching: no   Symptoms Dysuria: no  Bleeding: no  Pelvic pain: yes  Back pain: yes  Fever: no  Genital sores: no  Rash: no  Dyspareunia: no  GI Sxs: no  Prior treatment: no   Red Flags: Missed period: no  Pregnancy: no  Recent antibiotics: no  Sexual activity: no  Possible STD exposure: no  IUD: no  Diabetes: no   Need for TB test Patient needs [employer] Spanish test for school. 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   Cardiovascular: - Risk as of 11/11/19(date): The ASCVD Risk score Mikey Bussing DC Jr., et al., 2013) failed to calculate for the following reasons:   The 2013 ASCVD risk score is only valid for ages 59 to 93 - Dx Hypertension: no  - Dx Hyperlipidemia: no - Dx Obesity: no   - Physical Activity: no  - Diabetes: no   Cancer: Colorectal >> Colonoscopy: no  Lung >> Tobacco Use: no   - If so, previous Low-Dose CT screen: no  Breast >> Mammogram: no   Cervical/Endometrial >>  - Postmenopausal: no  - Vaginal Bleeding: no - Pap Smear: UTD  Skin >> Suspicious lesions: no   Social: Alcohol Use: no  Tobacco Use: no   - Interested in Quitting: N/A  Other Drugs: no  Risky Sexual Behavior: no; not sexually active >5 years Depression: no   - PHQ2 score: 0 Support and Life at Home: yes   Other: Osteoporosis: no   Zoster Vaccine: no  Flu Vaccine: UTD  Pneumonia Vaccine: no   Past Medical History Patient Active Problem List   Diagnosis Date  Noted  . Urge incontinence 11/11/2019  . Rash 01/31/2019  . Palpitations 07/18/2017  . Hypotension 07/18/2017  . Fatigue 02/01/2017  . Low thyroid stimulating hormone (TSH) level 02/01/2017  . Weight loss 02/01/2017  . Breast skin changes 12/12/2016  . Hyperthyroidism 12/12/2016  . Allergic rhinitis 01/06/2015  . Healthcare maintenance 01/06/2015  . Vaginal discharge 01/16/2013    Medications- reviewed and updated Current Outpatient Medications  Medication Sig Dispense Refill  . cephALEXin (KEFLEX) 500 MG capsule Take 1 capsule (500 mg total) by mouth 2 (two) times daily. For 7 days 14 capsule 0  . clobetasol cream (TEMOVATE) [id number] % Apply 1 application topically 2 (two) times daily. 30 g 0  . cyclobenzaprine (FLEXERIL) 10 MG tablet Take 1 tablet (10 mg total) by mouth 3 (three) times daily. 21 tablet 0  . diclofenac (VOLTAREN) 75 MG EC tablet Take 1 tablet (75 mg total) by mouth 2 (two) times daily. 20 tablet 0  . ibuprofen (ADVIL,MOTRIN) 800 MG tablet Take 800 mg by mouth every 6 (six) hours as needed. for pain  0   No current facility-administered medications for this visit.    Objective: BP 90/60   Pulse 61   Ht 5\' 3"  (1.6 m)   Wt 110 lb 9.6 oz (50.2 kg)   SpO2 98%   BMI 19.59 kg/m  Gen: NAD, alert, cooperative with exam HEENT: NCAT, EOMI, PERRL CV: RRR, good S1/S2, no murmur Resp: CTABL, no wheezes, non-labored Abd: Soft, Non Tender, Non Distended, BS present, no guarding or organomegaly Genital Exam: normal external genitalia, vulva, vagina, cervix, uterus and adnexa. Chaperone present  Ext: No edema, warm Neuro: Alert and oriented, No gross deficits   Assessment/Plan:  Urge incontinence Symptoms consistent with urge incontinence. Patient reports urinary urgency as well as pressure feeling and then incontinence. Given 3 vaginal births, likely weakness of pelvic floor. Advised Kegel exercises at a minimum of tid. Advised f/u in 1 month. UA negative for infection.     Vaginal discharge Wet prep negative. Pelvic exam wnl. Likely normal vaginal discharge. F/u if no improvement, sooner if worsening  Healthcare maintenance Quant gold ordered for patient's school.  Flu vaccine obtained at work, updated in [place] This Encounter  Procedures  . QuantiFERON-TB Gold Plus  . POCT urinalysis dipstick  . POCT Wet Prep [employer])  . POCT UA - Microscopic Only    No orders of the defined types were placed in this encounter.    Caroline More, DO, PGY-3 11/11/2019 8:10 PM

## 2019-11-11 ENCOUNTER — Other Ambulatory Visit: Payer: Self-pay

## 2019-11-11 ENCOUNTER — Encounter: Payer: Self-pay | Admitting: Family Medicine

## 2019-11-11 ENCOUNTER — Ambulatory Visit (INDEPENDENT_AMBULATORY_CARE_PROVIDER_SITE_OTHER): Payer: Medicaid Other | Admitting: Family Medicine

## 2019-11-11 VITALS — BP 90/60 | HR 61 | Ht 63.0 in | Wt 110.6 lb

## 2019-11-11 DIAGNOSIS — Z111 Encounter for screening for respiratory tuberculosis: Secondary | ICD-10-CM | POA: Diagnosis not present

## 2019-11-11 DIAGNOSIS — Z0001 Encounter for general adult medical examination with abnormal findings: Secondary | ICD-10-CM

## 2019-11-11 DIAGNOSIS — N898 Other specified noninflammatory disorders of vagina: Secondary | ICD-10-CM | POA: Diagnosis not present

## 2019-11-11 DIAGNOSIS — R300 Dysuria: Secondary | ICD-10-CM

## 2019-11-11 DIAGNOSIS — Z0000 Encounter for general adult medical examination without abnormal findings: Secondary | ICD-10-CM | POA: Diagnosis not present

## 2019-11-11 DIAGNOSIS — N3941 Urge incontinence: Secondary | ICD-10-CM | POA: Insufficient documentation

## 2019-11-11 LAB — POCT WET PREP (WET MOUNT)
Clue Cells Wet Prep Whiff POC: NEGATIVE
Trichomonas Wet Prep HPF POC: ABSENT

## 2019-11-11 LAB — POCT UA - MICROSCOPIC ONLY

## 2019-11-11 LAB — POCT URINALYSIS DIP (MANUAL ENTRY)
Bilirubin, UA: NEGATIVE
Glucose, UA: NEGATIVE mg/dL
Ketones, POC UA: NEGATIVE mg/dL
Leukocytes, UA: NEGATIVE
Nitrite, UA: NEGATIVE
Protein Ur, POC: NEGATIVE mg/dL
Spec Grav, UA: 1.02 (ref 1.010–1.025)
Urobilinogen, UA: 0.2 E.U./dL
pH, UA: 8.5 — AB (ref 5.0–8.0)

## 2019-11-11 NOTE — Assessment & Plan Note (Signed)
Symptoms consistent with urge incontinence. Patient reports urinary urgency as well as pressure feeling and then incontinence. Given 3 vaginal births, likely weakness of pelvic floor. Advised Kegel exercises at a minimum of tid. Advised f/u in 1 month. UA negative for infection.

## 2019-11-11 NOTE — Assessment & Plan Note (Signed)
Quant gold ordered for patient's school.  Flu vaccine obtained at work, updated in EMR

## 2019-11-11 NOTE — Patient Instructions (Signed)

## 2019-11-11 NOTE — Assessment & Plan Note (Signed)
Wet prep negative. Pelvic exam wnl. Likely normal vaginal discharge. F/u if no improvement, sooner if worsening

## 2019-11-13 LAB — QUANTIFERON-TB GOLD PLUS
QuantiFERON Mitogen Value: >10 IU/mL
QuantiFERON Nil Value: 0.03 IU/mL
QuantiFERON TB1 Ag Value: 0.04 IU/mL
QuantiFERON TB2 Ag Value: 0.02 IU/mL
QuantiFERON-TB Gold Plus: NEGATIVE

## 2020-02-21 ENCOUNTER — Emergency Department (HOSPITAL_COMMUNITY)
Admission: EM | Admit: 2020-02-21 | Discharge: 2020-02-22 | Disposition: A | Discharge status: Home / Self Care | Payer: Medicaid Other | Attending: Emergency Medicine | Admitting: Emergency Medicine

## 2020-02-21 ENCOUNTER — Telehealth: Payer: Self-pay | Admitting: Family Medicine

## 2020-02-21 ENCOUNTER — Emergency Department (HOSPITAL_COMMUNITY): Payer: Medicaid Other

## 2020-02-21 DIAGNOSIS — R110 Nausea: Secondary | ICD-10-CM

## 2020-02-21 DIAGNOSIS — Z20822 Contact with and (suspected) exposure to covid-19: Secondary | ICD-10-CM | POA: Insufficient documentation

## 2020-02-21 DIAGNOSIS — R197 Diarrhea, unspecified: Secondary | ICD-10-CM | POA: Diagnosis not present

## 2020-02-21 DIAGNOSIS — D259 Leiomyoma of uterus, unspecified: Secondary | ICD-10-CM | POA: Diagnosis not present

## 2020-02-21 DIAGNOSIS — R1084 Generalized abdominal pain: Secondary | ICD-10-CM | POA: Diagnosis present

## 2020-02-21 DIAGNOSIS — R109 Unspecified abdominal pain: Secondary | ICD-10-CM | POA: Diagnosis not present

## 2020-02-21 LAB — COMPREHENSIVE METABOLIC PANEL
ALT: 11 U/L (ref 0–44)
AST: 17 U/L (ref 15–41)
Albumin: 4.2 g/dL (ref 3.5–5.0)
Alkaline Phosphatase: 41 U/L (ref 38–126)
Anion gap: 9 (ref 5–15)
BUN: 8 mg/dL (ref 6–20)
CO2: 24 mmol/L (ref 22–32)
Calcium: 9.1 mg/dL (ref 8.9–10.3)
Chloride: 102 mmol/L (ref 98–111)
Creatinine, Ser: 0.77 mg/dL (ref 0.44–1.00)
GFR calc Af Amer: >60 mL/min (ref >60)
GFR calc non Af Amer: >60 mL/min (ref >60)
Glucose, Bld: 43 mg/dL — CL (ref 70–99)
Potassium: 3.5 mmol/L (ref 3.5–5.1)
Sodium: 135 mmol/L (ref 135–145)
Total Bilirubin: 0.6 mg/dL (ref 0.3–1.2)
Total Protein: 7.2 g/dL (ref 6.5–8.1)

## 2020-02-21 LAB — I-STAT BETA HCG BLOOD, ED (MC, WL, AP ONLY): I-stat hCG, quantitative: <5 m[IU]/mL (ref <5)

## 2020-02-21 LAB — URINALYSIS, ROUTINE W REFLEX MICROSCOPIC
Bilirubin Urine: NEGATIVE
Glucose, UA: NEGATIVE mg/dL
Ketones, ur: NEGATIVE mg/dL
Nitrite: NEGATIVE
Protein, ur: NEGATIVE mg/dL
Specific Gravity, Urine: 1.027 (ref 1.005–1.030)
pH: 5 (ref 5.0–8.0)

## 2020-02-21 LAB — CBC
HCT: 43.8 % (ref 36.0–46.0)
Hemoglobin: 14.7 g/dL (ref 12.0–15.0)
MCH: 31.3 pg (ref 26.0–34.0)
MCHC: 33.6 g/dL (ref 30.0–36.0)
MCV: 93.2 fL (ref 80.0–100.0)
Platelets: 247 10*3/uL (ref 150–400)
RBC: 4.7 MIL/uL (ref 3.87–5.11)
RDW: 12.3 % (ref 11.5–15.5)
WBC: 6.8 10*3/uL (ref 4.0–10.5)
nRBC: 0 % (ref 0.0–0.2)

## 2020-02-21 LAB — WET PREP, GENITAL
Clue Cells Wet Prep HPF POC: NONE SEEN
Sperm: NONE SEEN
Trich, Wet Prep: NONE SEEN
Yeast Wet Prep HPF POC: NONE SEEN

## 2020-02-21 LAB — LIPASE, BLOOD: Lipase: 35 U/L (ref 11–51)

## 2020-02-21 LAB — CBG MONITORING, ED
Glucose-Capillary: 100 mg/dL — ABNORMAL HIGH (ref 70–99)
Glucose-Capillary: 95 mg/dL (ref 70–99)

## 2020-02-21 MED ORDER — MORPHINE SULFATE (PF) 4 MG/ML IV SOLN
4.0000 mg | Freq: Once | INTRAVENOUS | Status: AC
  Administered 2020-02-21: 4 mg via INTRAVENOUS
  Filled 2020-02-21: qty 1

## 2020-02-21 MED ORDER — SODIUM CHLORIDE 0.9% FLUSH: 3.0000 mL | Freq: Once | INTRAVENOUS | Status: DC

## 2020-02-21 MED ORDER — ONDANSETRON HCL 4 MG/2ML IJ SOLN
4.0000 mg | Freq: Once | INTRAMUSCULAR | Status: AC
  Administered 2020-02-21: 4 mg via INTRAVENOUS
  Filled 2020-02-21: qty 2

## 2020-02-21 NOTE — ED Provider Notes (Signed)
[facility] EMERGENCY DEPARTMENT Provider Note   CSN: [id number] Arrival date & time: 02/21/20  1956     History Chief Complaint  Patient presents with  . Abdominal Pain    Caroline Jensen is a 34 y.o. female.  Patient presents to the ED with a chief complaint of abdominal pain.  She states that the pain started yesterday morning.  She reports associated watery diarrhea, but denies any vomiting.  She has had nausea.  She denies any fever, chills, cough.  She states that she has been having some new vaginal discharge and dysuria.  She states that the pain is mostly located in her lower abdomen and radiates around to her low back.  She denies any new sexual partners.  Denies any other medical problems.  The history is provided by the patient. No language interpreter was used.       Past Medical History:  Diagnosis Date  . Genital warts     Patient Active Problem List   Diagnosis Date Noted  . Urge incontinence 11/11/2019  . Rash 01/31/2019  . Palpitations 07/18/2017  . Hypotension 07/18/2017  . Fatigue 02/01/2017  . Low thyroid stimulating hormone (TSH) level 02/01/2017  . Weight loss 02/01/2017  . Breast skin changes 12/12/2016  . Hyperthyroidism 12/12/2016  . Allergic rhinitis 01/06/2015  . Healthcare maintenance 01/06/2015  . Vaginal discharge 01/16/2013    Past Surgical History:  Procedure Laterality Date  . LASER ABLATION OF CONDYLOMAS       OB History    Gravida  3   Para  3   Term  2   Preterm  1   AB      Living  3     SAB      TAB      Ectopic      Multiple      Live Births  3           Family History  Problem Relation Age of Onset  . Drug abuse Father   . Cancer Paternal Aunt        leukemia  . Hypertension Maternal Aunt   . Stroke Other        unknown  . Cirrhosis Maternal Grandmother   . Urticaria Brother   . Eczema Son   . Diabetes Neg Hx   . Heart disease Neg Hx   . Allergic rhinitis Neg Hx   .  Asthma Neg Hx     Social History   Tobacco Use  . Smoking status: Never Smoker  . Smokeless tobacco: Never Used  Vaping Use  . Vaping Use: Never used  Substance Use Topics  . Alcohol use: No    Alcohol/week: 0.0 standard drinks  . Drug use: No    Home Medications Prior to Admission medications   Medication Sig Start Date End Date Taking? Authorizing Provider  cephALEXin (KEFLEX) 500 MG capsule Take 1 capsule (500 mg total) by mouth 2 (two) times daily. For 7 days 11/04/18   Leeanne Rio, MD  clobetasol cream (TEMOVATE) 5.64 % Apply 1 application topically 2 (two) times daily. 08/04/19   Caroline More, DO  cyclobenzaprine (FLEXERIL) 10 MG tablet Take 1 tablet (10 mg total) by mouth 3 (three) times daily. 08/12/18   Rodriguez-Southworth, Sunday Spillers, PA-C  diclofenac (VOLTAREN) 75 MG EC tablet Take 1 tablet (75 mg total) by mouth 2 (two) times daily. 08/12/18   Rodriguez-Southworth, Sunday Spillers, PA-C  ibuprofen (ADVIL,MOTRIN) 800 MG tablet Take 800 mg  by mouth every 6 (six) hours as needed. for pain 04/01/18   [provider]    Allergies    Patient has no known allergies.  Review of Systems   Review of Systems  All other systems reviewed and are negative.   Physical Exam Updated Vital Signs BP 105/77 (BP Location: Left Arm)   Pulse 94   Temp 98.7 F (37.1 C)   Resp 18   Ht 5\' 2"  (1.575 m)   Wt 49 kg   LMP 02/12/2020   SpO2 98%   BMI 19.75 kg/m   Physical Exam Vitals and nursing note reviewed.  Constitutional:      General: She is not in acute distress.    Appearance: She is well-developed.  HENT:     Head: Normocephalic and atraumatic.  Eyes:     Conjunctiva/sclera: Conjunctivae normal.  Cardiovascular:     Rate and Rhythm: Normal rate and regular rhythm.     Heart sounds: No murmur heard.   Pulmonary:     Effort: Pulmonary effort is normal. No respiratory distress.     Breath sounds: Normal breath sounds.  Abdominal:     Palpations: Abdomen is  soft.     Tenderness: There is abdominal tenderness.     Comments: Generalized lower abdominal tenderness  Genitourinary:    Comments: Chaperone present for pelvic Mild thick/white discharge, no cervical motion tenderness, moderate suprapubic tenderness Musculoskeletal:        General: Normal range of motion.     Cervical back: Neck supple.  Skin:    General: Skin is warm and dry.  Neurological:     Mental Status: She is alert.  Psychiatric:        Mood and Affect: Mood normal.        Behavior: Behavior normal.     ED Results / Procedures / Treatments   Labs (all labs ordered are listed, but only abnormal results are displayed) Labs Reviewed  COMPREHENSIVE METABOLIC PANEL - Abnormal; Notable for the following components:      Result Value   Glucose, Bld 43 (*)    All other components within normal limits  URINALYSIS, ROUTINE W REFLEX MICROSCOPIC - Abnormal; Notable for the following components:   APPearance HAZY (*)    Hgb urine dipstick SMALL (*)    Leukocytes,Ua TRACE (*)    Bacteria, UA RARE (*)    All other components within normal limits  CBG MONITORING, ED - Abnormal; Notable for the following components:   Glucose-Capillary 100 (*)    All other components within normal limits  WET PREP, GENITAL  SARS CORONAVIRUS 2 BY RT PCR (HOSPITAL ORDER, [facility])  LIPASE, BLOOD  CBC  I-STAT BETA HCG BLOOD, ED ([facility], [facility], [facility] ONLY)  CBG MONITORING, ED  GC/CHLAMYDIA PROBE AMP ([place]) NOT AT [facility]    EKG None  Radiology No results found.  Procedures Procedures (including critical care time)  Medications Ordered in ED Medications  sodium chloride flush (NS) 0.9 % injection 3 mL (has no administration in time range)    ED Course  I have reviewed the triage vital signs and the nursing notes.  Pertinent labs & imaging results that were available during my care of the patient were reviewed by me and considered in my medical decision making  (see chart for details).    MDM Rules/Calculators/A&P  This patient complains of lower abdominal pain and diarrhea, this involves an extensive number of treatment options, and is a complaint that carries with it a high risk of complications and morbidity.  DDx includes but is not limited to, appy, cystitis, colitis, TOA, cervicitis.  No adnexal tenderness or cervical motion tenderness on pelvic.  Doubt TOA, torsion, or cervicitis.  Pertinent Labs I ordered, reviewed, and interpreted labs, which included CBC which is normal, CMP notable for glucose of 43, but this was rechecked x2 and is normal.  I question the validity of this result.  Lipase is normal.  UA is abnormal, could indicate cystitis.  Imaging Interpretation I ordered imaging studies which included CT abd/pel, which showed findings consistent with probable hemorrhagic right intramural uterine fibroid, no other acute CT findings.   Medications I ordered medication morphine and zofran for pain and nausea.   Reassessments After the interventions stated above, I reevaluated the patient and found stable for discharge.  I suspect the diarrhea was viral in nature.  Given ibuprofen for fibroid pain.  Encouraged close outpatient follow-up.  Final Clinical Impression(s) / ED Diagnoses Final diagnoses:  Uterine leiomyoma, unspecified location  Diarrhea, unspecified type  Nausea    Rx / DC Orders ED Discharge Orders    None       Montine Circle, PA-C 02/22/20 0052    Quintella Reichert, MD 02/22/20 1445

## 2020-02-21 NOTE — ED Triage Notes (Signed)
Onset yesterday early am when pt woke up had diarrhea, yesterday evening started having lower mid abd pain, back pain, and urinary urgency and did not have anymore diarrhea.   Denies dysuria, hematuria, vomiting, diarrhea, and nausea. No one in household sick.  Pt is not sexually active and denies STD concerns.

## 2020-02-21 NOTE — Telephone Encounter (Signed)
After-hours emergency line phone call  Patient called the after-hours emergency line reporting "I have been having a lot of pain in my bladder and low back and upper stomach, I am concerned it is my kidney".  Patient reports that it hurts really bad.  The pain started on Friday morning at 4:00 in the morning, she reports having some diarrhea and nausea, but thought it was maybe a stomach bug.  She felt a little bit better until she went to work that night.  At night she had a sensation that she needed to stool, kept trying to use the bathroom, but was unable to.  Continued to feel unwell so she came home early from work.  Additionally, at work last night she had a hard time even standing normally due to the abdominal pain.  She reports she has been having the pain for the last couple of months when she is needed to urinate and even reports some urinary leakage between trying to use the bathroom.  She had an appointment in March 2021 with urinary leakage and was instructed to try Kegel exercises.  Her symptoms have worsened, but she reports she is been trying to deal with them the best she can.  Symptoms include a little bit of dizziness, pain in the bottom backside of her ribs that wraps around the front goes to the bladder on both sides, suprapubic pain, nausea, and vaginal discharge.  She denies having any fevers, blood in urine, unusual urine odors, vomiting chills, weight loss  No history of renal stones.  At this point cannot rule out kidney stone, pyelonephritis, or even pelvic inflammatory disease with history of lower abdominal/pelvic pain and vaginal discharge.  I strongly encourage the patient to come to the [facility] emergency department for evaluation to rule out ominous causes of her lower abdominal pain.  Patient agrees with plan and states she will make her way to the emergency department.  Night team has been FYI'ed about this patient.   Milus Banister, [place], PGY-2 02/21/2020 6:26 PM

## 2020-02-21 NOTE — ED Notes (Addendum)
cbg 100, brittany RN notified

## 2020-02-22 ENCOUNTER — Emergency Department (HOSPITAL_COMMUNITY): Payer: Medicaid Other

## 2020-02-22 DIAGNOSIS — R197 Diarrhea, unspecified: Secondary | ICD-10-CM | POA: Diagnosis not present

## 2020-02-22 DIAGNOSIS — R109 Unspecified abdominal pain: Secondary | ICD-10-CM | POA: Diagnosis not present

## 2020-02-22 LAB — CBG MONITORING, ED: Glucose-Capillary: 80 mg/dL (ref 70–99)

## 2020-02-22 LAB — SARS CORONAVIRUS 2 BY RT PCR (HOSPITAL ORDER, PERFORMED IN ~~LOC~~ HOSPITAL LAB): SARS Coronavirus 2: NEGATIVE

## 2020-02-22 MED ORDER — SODIUM CHLORIDE 0.9 % IV BOLUS
1000.0000 mL | Freq: Once | INTRAVENOUS | Status: AC
  Administered 2020-02-22: 1000 mL via INTRAVENOUS

## 2020-02-22 MED ORDER — IBUPROFEN 800 MG PO TABS: 800.0000 mg | ORAL_TABLET | Freq: Three times a day (TID) | ORAL | 0 refills | Status: DC

## 2020-02-22 MED ORDER — IOHEXOL 300 MG/ML  SOLN
100.0000 mL | Freq: Once | INTRAMUSCULAR | Status: AC | PRN
  Administered 2020-02-22: 100 mL via INTRAVENOUS

## 2020-02-22 MED ORDER — ONDANSETRON HCL 4 MG PO TABS: 4.0000 mg | ORAL_TABLET | Freq: Three times a day (TID) | ORAL | 0 refills | Status: DC | PRN

## 2020-02-22 NOTE — ED Notes (Signed)
Kenney Houseman mother [phone]

## 2020-02-23 LAB — GC/CHLAMYDIA PROBE AMP (~~LOC~~) NOT AT ARMC
Chlamydia: NEGATIVE
Comment: NEGATIVE
Comment: NORMAL
Neisseria Gonorrhea: NEGATIVE

## 2020-02-26 ENCOUNTER — Encounter: Payer: Self-pay | Admitting: Family Medicine

## 2020-02-26 ENCOUNTER — Ambulatory Visit (INDEPENDENT_AMBULATORY_CARE_PROVIDER_SITE_OTHER): Payer: Medicaid Other | Admitting: Family Medicine

## 2020-02-26 ENCOUNTER — Other Ambulatory Visit: Payer: Self-pay

## 2020-02-26 VITALS — BP 90/62 | HR 81 | Ht 62.0 in | Wt 111.2 lb

## 2020-02-26 DIAGNOSIS — R21 Rash and other nonspecific skin eruption: Secondary | ICD-10-CM | POA: Diagnosis not present

## 2020-02-26 DIAGNOSIS — R1084 Generalized abdominal pain: Secondary | ICD-10-CM

## 2020-02-26 DIAGNOSIS — D219 Benign neoplasm of connective and other soft tissue, unspecified: Secondary | ICD-10-CM

## 2020-02-26 DIAGNOSIS — D251 Intramural leiomyoma of uterus: Secondary | ICD-10-CM | POA: Insufficient documentation

## 2020-02-26 MED ORDER — TRIAMCINOLONE ACETONIDE 0.5 % EX OINT: 1.0000 "application " | TOPICAL_OINTMENT | Freq: Two times a day (BID) | CUTANEOUS | 3 refills | Status: DC

## 2020-02-26 MED ORDER — CEPHALEXIN 500 MG PO CAPS
500.0000 mg | ORAL_CAPSULE | Freq: Four times a day (QID) | ORAL | 0 refills | Status: AC
Start: 2020-02-26 — End: 2020-03-02

## 2020-02-26 NOTE — Assessment & Plan Note (Signed)
Patient presenting with ED follow up.  Patient continues to have abdominal pain.  I do think there is a component of a UTI given that patient has symptoms of frequency as well as abdominal pain.  We will plan to treat with Keflex given that she had UAs leukocytes and hemoglobin.  Advised to follow-up in 2 weeks for repeat urinalysis.  Also will send referral to OB/GYN for uterine fibroid.  Follow-up in 2 weeks.

## 2020-02-26 NOTE — Patient Instructions (Signed)
Urinary Tract Infection, Adult A urinary tract infection (UTI) is an infection of any part of the urinary tract. The urinary tract includes:  The kidneys.  The ureters.  The bladder.  The urethra. These organs make, store, and get rid of pee (urine) in the body. What are the causes? This is caused by germs (bacteria) in your genital area. These germs grow and cause swelling (inflammation) of your urinary tract. What increases the risk? You are more likely to develop this condition if:  You have a small, thin tube (catheter) to drain pee.  You cannot control when you pee or poop (incontinence).  You are female, and: ? You use these methods to prevent pregnancy:  A medicine that kills sperm (spermicide).  A device that blocks sperm (diaphragm). ? You have low levels of a female hormone (estrogen). ? You are pregnant.  You have genes that add to your risk.  You are sexually active.  You take antibiotic medicines.  You have trouble peeing because of: ? A prostate that is bigger than normal, if you are female. ? A blockage in the part of your body that drains pee from the bladder (urethra). ? A kidney stone. ? A nerve condition that affects your bladder (neurogenic bladder). ? Not getting enough to drink. ? Not peeing often enough.  You have other conditions, such as: ? Diabetes. ? A weak disease-fighting system (immune system). ? Sickle cell disease. ? Gout. ? Injury of the spine. What are the signs or symptoms? Symptoms of this condition include:  Needing to pee right away (urgently).  Peeing often.  Peeing small amounts often.  Pain or burning when peeing.  Blood in the pee.  Pee that smells bad or not like normal.  Trouble peeing.  Pee that is cloudy.  Fluid coming from the vagina, if you are female.  Pain in the belly or lower back. Other symptoms include:  Throwing up (vomiting).  No urge to eat.  Feeling mixed up (confused).  Being tired  and grouchy (irritable).  A fever.  Watery poop (diarrhea). How is this treated? This condition may be treated with:  Antibiotic medicine.  Other medicines.  Drinking enough water. Follow these instructions at home:  Medicines  Take over-the-counter and prescription medicines only as told by your doctor.  If you were prescribed an antibiotic medicine, take it as told by your doctor. Do not stop taking it even if you start to feel better. General instructions  Make sure you: ? Pee until your bladder is empty. ? Do not hold pee for a long time. ? Empty your bladder after sex. ? Wipe from front to back after pooping if you are a female. Use each tissue one time when you wipe.  Drink enough fluid to keep your pee pale yellow.  Keep all follow-up visits as told by your doctor. This is important. Contact a doctor if:  You do not get better after 1-2 days.  Your symptoms go away and then come back. Get help right away if:  You have very bad back pain.  You have very bad pain in your lower belly.  You have a fever.  You are sick to your stomach (nauseous).  You are throwing up. Summary  A urinary tract infection (UTI) is an infection of any part of the urinary tract.  This condition is caused by germs in your genital area.  There are many risk factors for a UTI. These include having a small, thin   tube to drain pee and not being able to control when you pee or poop.  Treatment includes antibiotic medicines for germs.  Drink enough fluid to keep your pee pale yellow. This information is not intended to replace advice given to you by your health care provider. Make sure you discuss any questions you have with your health care provider. Document Revised: 08/08/2018 Document Reviewed: 02/28/2018 Elsevier Patient Education  2020 Elsevier Inc.  

## 2020-02-26 NOTE — Progress Notes (Signed)
    SUBJECTIVE:   CHIEF COMPLAINT / HPI:   Abdominal pain  Seen in ED on 6/19 for abdominal pain. Lab work showing CBC wnl, CMP without elevated Cr or AST/ALT and normal bili, lipase wnl. Wet prep negative, neg gonorrhea and chlamydia testing. CT performed showing heterogeneous probable hemorrhagic right intramural uterine fibroid 3.3x3.0cm.   Since leaving ED patient continues to have abdominal pain. Reports pain is usually when she needs urinates but does not have dysuria. Also endorses urinary frequency. Does not endorse urgency. No hematuria. Patient is not sexually active. Does drink 3C water a day. UA performed in ED showing leukocytes and hemoglobin.  Patient taking ibuprofen for pain which helps.   Rash Patient notes rash on chest x1 week. Reports it is red and itchy. It is also flaky and dry. OTC hydrocortisone helps but did not take off full rash. Does not have a known history of eczema. No new soaps, shampoos, detergents. Uses sensitive skin dove and eucerin. Only non scented products. No other family members with similar rash.    PERTINENT  PMH / PSH: rash, vaginal discharge, urge incontinence   OBJECTIVE:   BP 90/62   Pulse 81   Ht 5\' 2"  (1.575 m)   Wt 111 lb 3.2 oz (50.4 kg)   LMP 02/12/2020 Comment: Negative HCG in ED today  SpO2 99%   BMI 20.34 kg/m   Gen: awake and alert, NAD HEENT: normal conjunctiva Cardio: RRR, no MRG Resp: CTAB, no wheezes, rales or rhonchi GI: soft, diffuse tenderness, non distended, bowel sounds present Ext: no edema  GU: recently performed in ED so will not repeat   ASSESSMENT/PLAN:   Abdominal pain Patient presenting with ED follow up.  Patient continues to have abdominal pain.  I do think there is a component of a UTI given that patient has symptoms of frequency as well as abdominal pain.  We will plan to treat with Keflex given that she had UAs leukocytes and hemoglobin.  Advised to follow-up in 2 weeks for repeat urinalysis.  Also  will send referral to OB/GYN for uterine fibroid.  Follow-up in 2 weeks.  Rash Rash consistent with atopic dermatitis.  Seems to have improved somewhat with hydrocortisone.  Will likely just need a stronger dose.  Will give triamcinolone and advised follow-up if no improvement.  Sooner if worsening.     Caroline More, [place]

## 2020-02-26 NOTE — Assessment & Plan Note (Signed)
Rash consistent with atopic dermatitis.  Seems to have improved somewhat with hydrocortisone.  Will likely just need a stronger dose.  Will give triamcinolone and advised follow-up if no improvement.  Sooner if worsening.

## 2020-04-26 ENCOUNTER — Encounter: Payer: Medicaid Other | Admitting: Obstetrics and Gynecology

## 2020-04-26 ENCOUNTER — Encounter: Payer: Self-pay | Admitting: Obstetrics and Gynecology

## 2020-04-26 NOTE — Progress Notes (Signed)
Patient did not keep her GYN appointment for 04/26/2020.  Durene Romans MD Attending Center for [employer])

## 2020-08-30 NOTE — Progress Notes (Signed)
    SUBJECTIVE:   CHIEF COMPLAINT / HPI: shoulder pain  Shoulder pain: Patient had R glenohumeral bursitis in 2016 and received 1 CSI with marked improvement. She has not had problems until the last few months when she noticed soreness and stiffness in both shoulders. Her pain is most noticeable when reaching behind her to grab something, but also at work. 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 She denies numbness/tingling. In the last week her pain has been marked by throbbing, especially when going to sleep after a long shift at work. R>L.   Abdominal pain/fibroid: patient had lower abdominal pain this summer, which was attributed to a 3x3 cm likely intramural, hemorrhagic fibroid found on CT a/p. Patient had referral to [facility], but never had appointment. She is not presently having pain today, but reports intermittent abdominal pain similar to previous and she would like to follow up with referral to [facility] to discuss treatment options for fibroid.  PERTINENT  PMH / PSH: right shoulder bursitis, fibroid  OBJECTIVE:   BP 94/60   Pulse 63   Ht 5\' 2"  (1.575 m)   Wt 106 lb 12.8 oz (48.4 kg)   LMP 08/04/2020 (Approximate)   SpO2 99%   BMI 19.53 kg/m   Nursing note and vitals reviewed GEN: Age-appropriate AA W, resting comfortably in chair, NAD, WNWD HEENT: NCAT. PERRLA. Sclera without injection or icterus. MMM.  Neck: Supple.  Shoulder: - Inspection reveals no abnormalities, atrophy or asymmetry b/l - Palpation is normal with no tenderness over AC joint or bicipital groove b/l - ROM is full in all planes, thought patient reports pain with full abduction on R - Rotator cuff strength normal on L, diminished on Right - Sign of impingement present on the Right with positive Hawkin's sign - Negative clunk and good stability b/l - Normal scapular function observed. - Painful arc positive on R, not left, no drop arm  sign Abdomen: Normoactive bowel sounds. No tenderness to deep or light palpation. No rebound or guarding.   Neuro: Alert and at baseline Ext: no edema Psych: Pleasant and appropriate   ASSESSMENT/PLAN:   Shoulder pain, bilateral Patient with history of R glenohumeral bursitis that responded well to CSI 5 years ago. Now with approx several weeks of worsening aching pain bilaterally, but physical findings only on R with likely impingement. Recommend patient do physical therapy to strengthen and stabilize shoulders. She is interested in CSI again as that worked well for her in the past, but would like 14/09/2019 evaluation and is amenable to referral to [facility]. 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 She recently switched to swing shift of 11P to 7A, decreased number of hours.   Intramural uterine fibroid Patient with intermittent abdominal pain, likely due to 3x3cm intramural, hemorrhagic fibroid found on CT a/p. She has active referral to [facility], gave her number to call to schedule an appointment for consultation with OBGYN for fibroid treatment.     [facility], MD [facility]

## 2020-08-30 NOTE — Patient Instructions (Addendum)
It was a pleasure to see you today!  1. Shoulder pain: continue using ibuprofen can alternate with tylenol, also recommend using heat and ice (alternating). Also consider using voltaren gel, salonpas, lidocaine patches. I have referred you to physical therapy and sports medicine. You should receive a call to schedule those appointments within the next 1-2 weeks. The number for sports medicine is [phone], you can call to schedule an appointment at your earliest convenience. Follow up with [place] if your pain is worse or you have trouble making these appointments.  2. Fibroid: You have an active referral to OBGYN for consult regarding abdominal pain and likely intramural uterine fibroid. Please call [phone] to schedule an appointment.  Be Well,  Dr. Chauncey Reading

## 2020-08-31 ENCOUNTER — Ambulatory Visit (INDEPENDENT_AMBULATORY_CARE_PROVIDER_SITE_OTHER): Payer: Medicaid Other | Admitting: Family Medicine

## 2020-08-31 ENCOUNTER — Other Ambulatory Visit: Payer: Self-pay

## 2020-08-31 VITALS — BP 94/60 | HR 63 | Ht 62.0 in | Wt 106.8 lb

## 2020-08-31 DIAGNOSIS — M12812 Other specific arthropathies, not elsewhere classified, left shoulder: Secondary | ICD-10-CM

## 2020-08-31 DIAGNOSIS — M12811 Other specific arthropathies, not elsewhere classified, right shoulder: Secondary | ICD-10-CM

## 2020-08-31 DIAGNOSIS — M25512 Pain in left shoulder: Secondary | ICD-10-CM | POA: Diagnosis not present

## 2020-08-31 DIAGNOSIS — M25511 Pain in right shoulder: Secondary | ICD-10-CM

## 2020-08-31 DIAGNOSIS — D251 Intramural leiomyoma of uterus: Secondary | ICD-10-CM | POA: Diagnosis not present

## 2020-08-31 NOTE — Assessment & Plan Note (Signed)
Patient with history of R glenohumeral bursitis that responded well to CSI 5 years ago. Now with approx several weeks of worsening aching pain bilaterally, but physical findings only on R with likely impingement. Recommend patient do physical therapy to strengthen and stabilize shoulders. She is interested in CSI again as that worked well for her in the past, but would like [place] evaluation and is amenable to referral to [facility]. 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 She recently switched to swing shift of 11P to 7A, decreased number of hours.

## 2020-08-31 NOTE — Assessment & Plan Note (Signed)
Patient with intermittent abdominal pain, likely due to 3x3cm intramural, hemorrhagic fibroid found on CT a/p. She has active referral to [facility], gave her number to call to schedule an appointment for consultation with OBGYN for fibroid treatment.

## 2020-09-06 ENCOUNTER — Encounter: Payer: Self-pay | Admitting: Family Medicine

## 2020-09-06 ENCOUNTER — Ambulatory Visit (INDEPENDENT_AMBULATORY_CARE_PROVIDER_SITE_OTHER): Payer: No Typology Code available for payment source | Admitting: Family Medicine

## 2020-09-06 ENCOUNTER — Ambulatory Visit: Payer: Self-pay

## 2020-09-06 ENCOUNTER — Other Ambulatory Visit: Payer: Self-pay

## 2020-09-06 VITALS — BP 105/70 | Ht 62.0 in | Wt 106.0 lb

## 2020-09-06 DIAGNOSIS — M25511 Pain in right shoulder: Secondary | ICD-10-CM

## 2020-09-06 DIAGNOSIS — M7541 Impingement syndrome of right shoulder: Secondary | ICD-10-CM | POA: Insufficient documentation

## 2020-09-06 MED ORDER — TRIAMCINOLONE ACETONIDE 40 MG/ML IJ SUSP: 40.0000 mg | Freq: Once | INTRAMUSCULAR | Status: DC

## 2020-09-06 MED ORDER — METHYLPREDNISOLONE ACETATE 40 MG/ML IJ SUSP
40.0000 mg | Freq: Once | INTRAMUSCULAR | Status: AC
  Administered 2020-09-06: 40 mg via INTRA_ARTICULAR

## 2020-09-06 NOTE — Assessment & Plan Note (Addendum)
Clinical pain distribution worsened with overhead activities and positive impingement signs on exam consistent with rotator cuff impingement/bursitis.  Reassured by unremarkable appearing RTC musculature on limited ultrasound, additionally no associated weakness on exam to suggest RTC tear.  Performed R subacromial CSI under ultrasound guidance, tolerated well.  May use Tylenol/NSAID PRN.  Provided with RTC at home exercises to do while transitioning to formal physical therapy.

## 2020-09-06 NOTE — Patient Instructions (Signed)
You have rotator cuff impingement Try to avoid painful activities (overhead activities, lifting with extended arm) as much as possible. Aleve 2 tabs twice a day with food OR ibuprofen 3 tabs three times a day with food for pain and inflammation as needed. Can take tylenol in addition to this. Subacromial injection may be beneficial to help with pain and to decrease inflammation - you were given this today. Start physical therapy with transition to home exercise program. Do home exercise program with theraband and scapular stabilization exercises daily 3 sets of 10 once a day. If not improving at follow-up we will consider further imaging and/or nitro patches. Follow up with me in 6 weeks.

## 2020-09-06 NOTE — Progress Notes (Signed)
Caroline Jensen - 35 y.o. female MRN [id number]  Date of birth: [birth year]  SUBJECTIVE:   CC: right shoulder pain   Caroline Jensen is a 35 year old female presenting for evaluation of progressive subacute/chronic right shoulder pain for the past approximate 6 months.  She also endorses some discomfort within her left shoulder, however right is the most concerning.  No preceding injury or trauma.  Throbbing/sore in nature.  Seems to be worse with overhead activities and heavy lifting.  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  Denies any associated muscle weakness, numbness/tingling, popping/locking.  She was diagnosed with R rotator cuff bursitis/impingement in 2016, received injection with resolution of pain until restarting as above.  Seen in [facility] on 12/28 and referred to sports medicine for further ultrasound/injection.  Placed referral to formal PT at that time.  ROS: No unexpected weight loss, fever, chills, swelling, numbness/tingling, redness, otherwise see HPI   PMHx - Updated and reviewed.  Contributory factors include: Negative Social Hx - Updated and reviewed. Contributory factors include: Negative Medications - reviewed   DATA REVIEWED: [place] note 12/28  PHYSICAL EXAM:  VS: BP:105/70  HR: bpm  TEMP: ( )  RESP:   HT:5\' 2"  (157.5 cm)   WT:106 lb (48.1 kg)  BMI:19.38 PHYSICAL EXAM: Gen: NAD, alert, cooperative with exam, well-appearing HEENT: clear conjunctiva,  Resp: non-labored Skin: no rashes, normal turgor  Neuro: no gross deficits.  Psych:  alert and oriented  Bilateral Shoulders: Inspection reveals no obvious deformity, atrophy, or asymmetry bilaterally.  No bruising. No swelling Palpation is normal with no TTP over [facility] joint or bicipital groove. Full ROM in flexion, abduction (elicts pain on the R above ~30 degrees), internal/external rotation NV intact distally Special Tests:  - Impingement: (+) Hawkins on R, neers, empty can sign. -  Supraspinatous: Negative empty can.  Strength with shoulder abduction limited due to pain. - Infraspinatous/Teres Minor: 5/5 strength with ER - Subscapularis: 5/5 strength with IR - Biceps tendon: Negative Speeds - Labrum: Negative Obriens, good stability - AC Joint: Negative cross arm - Painful arc present on R without drop arm sign  Limited MSK [place] of Right Shoulder:  Patient was seated on exam table and shoulder US examination was performed using high frequency linear probe. Subscapularis tendon visualized in both longitudinal and transverse axis intact inserting at the inferior lesser tubercule of the humerus. No signs of tear, no hypoechoic changes or tissue irregularity seen. Supraspinatus tendon visualized in longitudinal view.  No signs of tear with tendon inserting at the superior facet of the greater tubercle of the humerus, no tissue irregularity seen.  Mild bursa inflammation noted above supraspinatus.  Infraspinatous and teres minor tendons visualized in longitudinal axis with no signs of tearing, no hypoechoic changes or tissue irregularity seen. Procedure performed: Subacromial corticosteroid injection, ultrasound-guided  Ultrasound-guided subacromial injection: Consent obtained and verified.  Timeout conducted.  After sterile prep with alcohol, injected 3 to 1 lidocaine/methylprednisolone 40mg  using a 22-gauge spinal needle, passing the needle through lateral approach into the subacromial space.  Injectate was seen filling above supraspinatus.  She had good immediate relief during the anesthetic phase.  Follow-up as directed.  ASSESSMENT & PLAN:   Impingement syndrome of right shoulder Clinical pain distribution worsened with overhead activities and positive impingement signs on exam consistent with rotator cuff impingement/bursitis.  Reassured by unremarkable appearing RTC musculature on limited ultrasound, additionally no associated weakness on exam to suggest RTC tear.  Performed R  subacromial  CSI under ultrasound guidance, tolerated well.  May use Tylenol/NSAID PRN.  Provided with RTC at home exercises to do while transitioning to formal physical therapy.   Follow-up in approximately 6 weeks or sooner if needed.  Allayne Stack, DO  Family medicine PGY-3

## 2020-09-23 ENCOUNTER — Ambulatory Visit: Payer: Medicaid Other | Admitting: Family Medicine

## 2020-09-23 ENCOUNTER — Encounter: Payer: Self-pay | Admitting: Family Medicine

## 2020-09-23 NOTE — Progress Notes (Deleted)
    SUBJECTIVE:   CHIEF COMPLAINT / HPI: Return to work after COVID  ***  PERTINENT  PMH / PSH: Hypothyroidism, impingement syndrome of her right shoulder  OBJECTIVE:   There were no vitals taken for this visit.  ***  ASSESSMENT/PLAN:   No problem-specific Assessment & Plan notes found for this encounter.     Caroline Jensen, [place]

## 2020-09-24 ENCOUNTER — Encounter: Payer: Self-pay | Admitting: Family Medicine

## 2020-09-24 ENCOUNTER — Other Ambulatory Visit: Payer: Self-pay

## 2020-09-24 ENCOUNTER — Ambulatory Visit (INDEPENDENT_AMBULATORY_CARE_PROVIDER_SITE_OTHER): Payer: No Typology Code available for payment source | Admitting: Family Medicine

## 2020-09-24 VITALS — BP 98/72 | HR 61 | Wt 107.2 lb

## 2020-09-24 DIAGNOSIS — L209 Atopic dermatitis, unspecified: Secondary | ICD-10-CM

## 2020-09-24 DIAGNOSIS — L309 Dermatitis, unspecified: Secondary | ICD-10-CM

## 2020-09-24 DIAGNOSIS — R059 Cough, unspecified: Secondary | ICD-10-CM

## 2020-09-24 MED ORDER — TRIAMCINOLONE ACETONIDE 0.5 % EX OINT: 1.0000 "application " | TOPICAL_OINTMENT | Freq: Two times a day (BID) | CUTANEOUS | 3 refills | Status: DC

## 2020-09-24 NOTE — Progress Notes (Signed)
    SUBJECTIVE:   CHIEF COMPLAINT / HPI:   Cough Patient tested positive for COVID on 09/11/2020, has completed quarantine and has not returned back to work. States that she is feeling better and did not ever experience a loss of smell. But still endorsing a lingering cough that gets worse at night, sometimes makes it more difficult to sleep. Denies hemoptysis or phlegm. Denies fever, chills, night swears, vomiting and dyspnea. Has received both doses of the COVID vaccine. Experiencing fatigue during this post-COVID period as well.   PERTINENT  PMH / PSH:   Atopic Dermatitis Noted to have rash in the process of healing that started a few weeks ago. Experiencing mild pruritis, but not pain, drainage or bleeding noted. Denies using any new products, eating new foods or taking any new medications. Previously had a similar issue where she was prescribed triamcinolone which resolved her symptoms. Denies any other associated symptoms.   OBJECTIVE:   BP 98/72   Pulse 61   Wt 107 lb 3.2 oz (48.6 kg)   LMP 09/07/2020   SpO2 99%   BMI 19.61 kg/m   General: Patient well-appearing, in no acute distress. HEENT: normocephalic, supple neck, no lymphadenopathy CV: RRR, no murmurs or gallops auscultated  Resp: lungs clear to auscultation bilaterally Abdomen: soft, nontender, presence of active bowel sounds  Derm: skin warm and dry to touch, flat skin colored papules less than 0.5 cm long the left lateral neck and left thigh without erythema or drainage  Ext: radial pulses strong and equal bilaterally, no LE edema noted bilaterally Neuro: normal gait Psych: mood appropriate   ASSESSMENT/PLAN:   Cough in adult -likely post-COVID sequela, improving -recommended mucinex, if still persists, can consider tessalon   Atopic dermatitis -pre-existing and healing -triamcinolone refill provided     Donney Dice, DO [place]

## 2020-09-24 NOTE — Patient Instructions (Signed)
It was great seeing you today!  I am glad that you are feeling much better and recovered from [place]. It is great that you have already have both vaccines to protect against more severe symptoms. Unfortunately, there is little we can do about the fatigue you are experiencing post-COVID. Make sure to eat a healthy diet, exercise and get ample amounts of rest. Please take mucinex which you can get over the counter, this should help with your cough.   Please follow up at your next scheduled appointment, if anything arises between now and then, please don't hesitate to contact our office.   Thank you for allowing [place] to be a part of your medical care!  Thank you, Dr. Larae Grooms  Guaifenesin oral ER tablets What is this medicine? GUAIFENESIN (gwye FEN e sin) is an expectorant. It helps to thin mucous and make coughs more productive. This medicine is used to treat coughs caused by colds or the flu. It is not intended to treat chronic cough caused by smoking, asthma, emphysema, or heart failure. This medicine may be used for other purposes; ask your health care provider or pharmacist if you have questions. COMMON BRAND NAME(S): Humibid, Mucinex What should I tell my health care provider before I take this medicine? They need to know if you have any of these conditions:  fever  kidney disease  an unusual or allergic reaction to guaifenesin, other medicines, foods, dyes, or preservatives  pregnant or trying to get pregnant  breast-feeding How should I use this medicine? Take this medicine by mouth with a full glass of water. Follow the directions on the prescription label. Do not break, chew or crush this medicine. You may take with food or on an empty stomach. Take your medicine at regular intervals. Do not take your medicine more often than directed. Talk to your pediatrician regarding the use of this medicine in children. While this drug may be prescribed for children as young as 85 years old for  selected conditions, precautions do apply. Overdosage: If you think you have taken too much of this medicine contact a poison control center or emergency room at once. NOTE: This medicine is only for you. Do not share this medicine with others. What if I miss a dose? If you miss a dose, take it as soon as you can. If it is almost time for your next dose, take only that dose. Do not take double or extra doses. What may interact with this medicine? Interactions are not expected. This list may not describe all possible interactions. Give your health care provider a list of all the medicines, herbs, non-prescription drugs, or dietary supplements you use. Also tell them if you smoke, drink alcohol, or use illegal drugs. Some items may interact with your medicine. What should I watch for while using this medicine? Do not treat a cough for more than 1 week without consulting your doctor or health care professional. If you also have a high fever, skin rash, continuing headache, or sore throat, see your doctor. For best results, drink 6 to 8 glasses water daily while you are taking this medicine. What side effects may I notice from receiving this medicine? Side effects that you should report to your doctor or health care professional as soon as possible:  allergic reactions like skin rash, itching or hives, swelling of the face, lips, or tongue Side effects that usually do not require medical attention (report to your doctor or health care professional if they continue or  are bothersome):  dizziness  headache  stomach upset This list may not describe all possible side effects. Call your doctor for medical advice about side effects. You may report side effects to FDA at 1-800-FDA-1088. Where should I keep my medicine? Keep out of the reach of children. Store at room temperature between 20 and 25 degrees C (68 and 77 degrees F). Keep container tightly closed. Throw away any unused medicine after the  expiration date. NOTE: This sheet is a summary. It may not cover all possible information. If you have questions about this medicine, talk to your doctor, pharmacist, or health care provider.  2021 Elsevier/Gold Standard (2008-01-01 12:14:14)

## 2020-09-25 ENCOUNTER — Encounter: Payer: Self-pay | Admitting: Family Medicine

## 2020-09-25 DIAGNOSIS — L209 Atopic dermatitis, unspecified: Secondary | ICD-10-CM

## 2020-09-25 DIAGNOSIS — R059 Cough, unspecified: Secondary | ICD-10-CM | POA: Insufficient documentation

## 2020-09-25 HISTORY — DX: Atopic dermatitis, unspecified: L20.9

## 2020-09-25 NOTE — Assessment & Plan Note (Signed)
-  pre-existing and healing -triamcinolone refill provided

## 2020-09-25 NOTE — Assessment & Plan Note (Signed)
-  likely post-COVID sequela, improving -recommended mucinex, if still persists, can consider tessalon

## 2020-10-04 ENCOUNTER — Encounter: Payer: Self-pay | Admitting: Physical Therapy

## 2020-10-04 ENCOUNTER — Other Ambulatory Visit: Payer: Self-pay

## 2020-10-04 ENCOUNTER — Ambulatory Visit: Payer: No Typology Code available for payment source | Attending: Family Medicine | Admitting: Physical Therapy

## 2020-10-04 DIAGNOSIS — G8929 Other chronic pain: Secondary | ICD-10-CM | POA: Diagnosis not present

## 2020-10-04 DIAGNOSIS — M25511 Pain in right shoulder: Secondary | ICD-10-CM | POA: Insufficient documentation

## 2020-10-04 DIAGNOSIS — M25512 Pain in left shoulder: Secondary | ICD-10-CM | POA: Insufficient documentation

## 2020-10-04 DIAGNOSIS — M6281 Muscle weakness (generalized): Secondary | ICD-10-CM

## 2020-10-04 DIAGNOSIS — M357 Hypermobility syndrome: Secondary | ICD-10-CM | POA: Diagnosis not present

## 2020-10-04 NOTE — Therapy (Signed)
[address] [place], [place], [address] Phone: [phone]   Fax:  [phone]  Physical Therapy Evaluation  Patient Details  Name: Caroline Jensen MRN: [id number] Date of Birth: 1985/12/25 Referring Provider (PT): Darrelyn Hillock, DO   Encounter Date: 10/04/2020   PT End of Session - 10/04/20 1215    Visit Number 1    Number of Visits 13    Date for PT Re-Evaluation 11/15/20    Authorization Type Focus, no longer has MCD per patient    PT Start Time 1130    PT Stop Time 1220    PT Time Calculation (min) 50 min    Activity Tolerance Patient tolerated treatment well    Behavior During Therapy [facility] for tasks assessed/performed           Past Medical History:  Diagnosis Date  . Atopic dermatitis 09/25/2020  . Breast skin changes 12/12/2016  . Fatigue 02/01/2017  . Genital warts   . Low thyroid stimulating hormone (TSH) level 02/01/2017  . Shoulder pain, bilateral 01/06/2015    Past Surgical History:  Procedure Laterality Date  . LASER ABLATION OF CONDYLOMAS      There were no vitals filed for this visit.    Subjective Assessment - 10/04/20 1135    Subjective Patient with chronic shoulder pain, worsening over the past several months.  Both shoulders bother her, Rt one more so because she is Rt handed.   Pain worse at night, especially when she lays on her side.  She recently received a cortisone shot last visit which only helped a little . She has some mild radiation of numbness in Rt UE, only occasionally.  Denies radiating pain.  She does feel somewhat weak. 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  She had a similar issue in 2016 that improved.  Reports she is "double jointed" and has a history of popping her shoulders out of place voluntarily. Recently recovered from [place].    Pertinent History chronic shoulder pain, hypermobility of joints, COVID 1/22.    Limitations  Lifting;House hold activities    Diagnostic tests msk US showed some supraspinatus bursitis    Patient Stated Goals Patient would like to decreased pain and improve mobility (stop guarding so much)    Currently in Pain? Yes    Pain Score 0-No pain   up to 3/10 with ROM , assessment   Pain Location Shoulder    Pain Orientation Right    Pain Descriptors / Indicators Sore;Throbbing    Pain Type Chronic pain    Pain Onset More than a month ago    Pain Frequency Intermittent    Aggravating Factors  using it    Pain Relieving Factors rest, reposition    Effect of Pain on Daily Activities work    Multiple Pain Sites No              [facility] PT Assessment - 10/04/20 0001      Assessment   Medical Diagnosis Rt shoulder pain    Referring Provider (PT) Darrelyn Hillock, DO    Onset Date/Surgical Date --   chronic   Hand Dominance Right    Next MD Visit 10/19/20    Prior Therapy No      Precautions   Precautions None      Restrictions   Weight Bearing Restrictions No      Balance Screen   Has the patient fallen  in the past 6 months No      [place] residence    Living Arrangements Children    Additional Comments 3 children < 17      Prior Function   Level of Independence Independent    Vocation Full time employment    Vocation Requirements labor and delivery nurse    Leisure 3 children, work night , limited as a Statistician Status Within Functional Limits for tasks assessed      Observation/Other Assessments   Focus on Therapeutic Outcomes (FOTO)  NT due to MCD , not set up      Sensation   Light Touch Appears Intact      Coordination   Gross Motor Movements are Fluid and Coordinated Not tested      Posture/Postural Control   Posture/Postural Control Postural limitations    Postural Limitations Anterior pelvic tilt;Increased lumbar lordosis;Forward head;Rounded Shoulders      AROM   Overall AROM  Comments pain end range flexion abduction and ER, IR in Rt UE.  Lt UE not painful in ER.IR only flexion    Right Shoulder Flexion 151 Degrees    Right Shoulder ABduction 122 Degrees    Right Shoulder Internal Rotation 80 Degrees   FR to mid thoracic   Right Shoulder External Rotation 90 Degrees   FR to T6   Left Shoulder Flexion 162 Degrees    Left Shoulder Internal Rotation 85 Degrees   FR to mid thoracic   Left Shoulder External Rotation --   FR to T5, > 90     Strength   Overall Strength Comments grip strength Rt 47 lbs  Lt 42 lbs    Right Shoulder Flexion 4-/5    Right Shoulder ABduction 4-/5    Right Shoulder Internal Rotation 4+/5    Right Shoulder External Rotation 4-/5    Left Shoulder Flexion 4/5    Left Shoulder ABduction 4/5    Left Shoulder Internal Rotation 5/5    Left Shoulder External Rotation 5/5      Palpation   Palpation comment tender along Rt cuff attachments , superior anterior shoulder and Rt supraspinatus      Special Tests    Special Tests Rotator Cuff Impingement      Neer Impingement test    Findings Positive    Side Right                      Objective measurements completed on examination: See above findings.       [facility] Adult PT Treatment/Exercise - 10/04/20 0001      Self-Care   Self-Care Other Self-Care Comments    Other Self-Care Comments  see pt educatio      Moist Heat Therapy   Number Minutes Moist Heat 10 Minutes    Moist Heat Location Shoulder                  PT Education - 10/04/20 1455    Education Details PT/POC, HEP, hypermobility, stability vs strength, impingement, eval findings    Person(s) Educated Patient    Methods Explanation;Demonstration;Handout;Verbal cues    Comprehension Verbalized understanding;Need further instruction               PT Long Term Goals - 10/04/20 1216      PT LONG TERM GOAL #1   Title Pt will be I with HEP for  bilateral shoulder stability, strength    Baseline  unknown    Time 6    Period Weeks    Status New    Target Date 11/15/20      PT LONG TERM GOAL #2   Title Pt will be more aware and mindful of joint positions, posture related to ADLs and work    Time 6    Period Weeks    Status New    Target Date 11/15/20      PT LONG TERM GOAL #3   Title Pt will demo no pain with overhead and cross body reaching(>120 deg) for home tasks, dressing, work    Time 6    Period Weeks    Status New    Target Date 11/15/20      PT LONG TERM GOAL #4   Title Pt will report pain at work no more than 3/10 with normal work duties (pulling , lifting)    Time 6    Period Weeks    Status New    Target Date 11/15/20                  Plan - 10/04/20 1216    Clinical Impression Statement Patient presents for low complexity eval of Rt > Lt. shoulder pain with signs and symptoms consistent with rotator cuff impingement.  Chronic hypermobility of glenohumeral joint may contribute to the irritability of tendons in rotator cuff.  She has mild weakness, limited relative ability to reach, pain with UE activity.  She is also unable to lie flat due to lumbar curve.  Will address spinal mobility as it relates to gross mobility tasks in home and at work.    Personal Factors and Comorbidities Comorbidity 1;Time since onset of injury/illness/exacerbation    Comorbidities chronic joint pain ,eczema    Examination-Activity Limitations Bathing;Carry;Reach Overhead;Caring for Others;Lift;Sleep;Hygiene/Grooming    Examination-Participation Restrictions Interpersonal Relationship;Occupation;Cleaning;Community Activity    Stability/Clinical Decision Making Stable/Uncomplicated    Clinical Decision Making Low    Rehab Potential Excellent    PT Frequency 2x / week    PT Duration 6 weeks    PT Treatment/Interventions ADLs/Self Care Home Management;Cryotherapy;Therapeutic exercise;Patient/family education;Taping;Functional mobility training;Iontophoresis 4mg /ml  Dexamethasone;Moist Heat;Ultrasound;Passive range of motion;Neuromuscular re-education;Therapeutic activities;Manual techniques;Electrical Stimulation    PT Next Visit Plan HEP, add stability for scapula (closed chain) , modalities as needed    PT Home Exercise Plan retraction, ER with green band    Consulted and Agree with Plan of Care Patient           Patient will benefit from skilled therapeutic intervention in order to improve the following deficits and impairments:  Decreased mobility,Pain,Impaired UE functional use,Increased fascial restricitons,Decreased strength,Hypermobility  Visit Diagnosis: Chronic right shoulder pain  Chronic left shoulder pain  Muscle weakness (generalized)  Hypermobility syndrome     Problem List Patient Active Problem List   Diagnosis Date Noted  . Cough in adult 09/25/2020  . Atopic dermatitis 09/25/2020  . Impingement syndrome of right shoulder 09/06/2020  . Intramural uterine fibroid 02/26/2020  . Urge incontinence 11/11/2019  . Palpitations 07/18/2017  . Hyperthyroidism 12/12/2016  . Allergic rhinitis 01/06/2015    Caroline Jensen 10/04/2020, 3:06 PM  [facility] [address] [place], [place], [address] Phone: [phone]   Fax:  [phone]  Name: Caroline Jensen MRN: [id number] Date of Birth: 05/10/1986    Raeford Razor, PT 10/04/20 3:06 PM Phone: [phone] Fax: [phone]

## 2020-10-04 NOTE — Patient Instructions (Signed)
Access Code: [email or web address]: [email or web address]: 01/31/2022Prepared by: Anderson Malta PaaExercises  Supine Shoulder External Rotation with Resistance - 2 x daily - 7 x weekly - 2 sets - 10 reps - 5 hold  Seated Scapular Retraction - 1 x daily - 7 x weekly - 2 sets - 10 reps - 5 hold

## 2020-10-18 ENCOUNTER — Ambulatory Visit: Payer: No Typology Code available for payment source | Admitting: Physical Therapy

## 2020-10-19 ENCOUNTER — Encounter: Payer: Self-pay | Admitting: Family Medicine

## 2020-10-19 ENCOUNTER — Ambulatory Visit: Payer: Medicaid Other | Admitting: Family Medicine

## 2020-10-19 ENCOUNTER — Other Ambulatory Visit: Payer: Self-pay

## 2020-10-19 ENCOUNTER — Ambulatory Visit: Payer: No Typology Code available for payment source | Admitting: Family Medicine

## 2020-10-19 VITALS — BP 92/64 | Ht 62.0 in | Wt 106.0 lb

## 2020-10-19 DIAGNOSIS — M25511 Pain in right shoulder: Secondary | ICD-10-CM | POA: Diagnosis not present

## 2020-10-19 MED ORDER — MELOXICAM 15 MG PO TABS: 15.0000 mg | ORAL_TABLET | Freq: Every day | ORAL | 2 refills | Status: DC

## 2020-10-19 NOTE — Patient Instructions (Signed)
You have rotator cuff impingement Try to avoid painful activities (overhead activities, lifting with extended arm) as much as possible. Meloxicam 15mg  daily with food for pain and inflammation. Can take tylenol in addition to this. Continue physical therapy with home exercises on days you don't go to therapy. If not improving at follow-up we will consider further imaging and/or nitro patches - let me know if you want to do either of these. Follow up with me in 5-6 weeks otherwise.

## 2020-10-19 NOTE — Progress Notes (Signed)
PCP: [name], DO  Subjective:   HPI: Patient is a 35 y.o. female here for right shoulder pain.  1/3: Caroline Jensen is a 35 year old female presenting for evaluation of progressive subacute/chronic right shoulder pain for the past approximate 6 months.  She also endorses some discomfort within her left shoulder, however right is the most concerning.  No preceding injury or trauma.  Throbbing/sore in nature.  Seems to be worse with overhead activities and heavy lifting.  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  Denies any associated muscle weakness, numbness/tingling, popping/locking.  She was diagnosed with R rotator cuff bursitis/impingement in 2016, received injection with resolution of pain until restarting as above.  2/15: Patient reports she's not much better since last visit. Pain still with overhead motions and left shoulder starting to hurt worse more as well. Did not feel like the injection right shoulder provided any benefit. Not taking any medications. Worse at work with lifting patients. + night pain.  Past Medical History:  Diagnosis Date  . Atopic dermatitis 09/25/2020  . Breast skin changes 12/12/2016  . Fatigue 02/01/2017  . Genital warts   . Low thyroid stimulating hormone (TSH) level 02/01/2017  . Shoulder pain, bilateral 01/06/2015    Current Outpatient Medications on File Prior to Visit  Medication Sig Dispense Refill  . triamcinolone ointment (KENALOG) 0.5 % Apply 1 application topically 2 (two) times daily. For moderate to severe eczema.  Do not use for more than 1 week at a time. 60 g 3   No current facility-administered medications on file prior to visit.    Past Surgical History:  Procedure Laterality Date  . LASER ABLATION OF CONDYLOMAS      No Known Allergies  Social History   Socioeconomic History  . Marital status: Single    Spouse name: Not on file  . Number of children: Not on file  . Years of education:  Not on file  . Highest education level: Not on file  Occupational History  . Not on file  Tobacco Use  . Smoking status: Never Smoker  . Smokeless tobacco: Never Used  Vaping Use  . Vaping Use: Never used  Substance and Sexual Activity  . Alcohol use: No    Alcohol/week: 0.0 standard drinks  . Drug use: No  . Sexual activity: Not Currently    Birth control/protection: None, Abstinence  Other Topics Concern  . 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 Drug abuse Father   . Cancer Paternal Aunt        leukemia  . Hypertension Maternal Aunt   . Stroke Other        unknown  . Cirrhosis Maternal Grandmother   . Urticaria Brother   . Eczema Son   . Diabetes Neg Hx   . Heart disease Neg Hx   . Allergic rhinitis Neg Hx   . Asthma Neg Hx     BP 92/64   Ht 5\' 2"  (1.575 m)  Wt 106 lb (48.1 kg)   BMI 19.39 kg/m   [place] Adult Exercise 09/06/2020  Frequency of aerobic exercise (# of days/week) 0  Average time in minutes 0  Frequency of strengthening activities (# of days/week) 0    No flowsheet data found.  Review of Systems: See HPI above.     Objective:  Physical Exam:  Gen: NAD, comfortable in exam room  Right shoulder: No swelling, ecchymoses.  No gross deformity. No TTP. FROM with painful arc. Negative Hawkins, positive Neers. Negative Yergasons. Strength 5/5 with empty can and resisted internal/external rotation.  Mild pain empty can. Negative apprehension. NV intact distally.  Left shoulder: No swelling, ecchymoses.  No  gross deformity. No TTP. FROM with painful arc. Negative Hawkins, positive Neers. Negative Yergasons. Strength 5/5 with empty can and resisted internal/external rotation.  Mild pain empty can. Negative apprehension. NV intact distally.   Assessment & Plan:  1. Bilateral shoulder pain - worse on right.  Ultrasound was reassuring.  Consistent with rotator cuff impingement.  Has only done one visit of PT to date - will continue with this and home exercises.  Injection without much benefit on right.  Start meloxicam.  Consider nitro patches, MRI if she does not improve.  F/u in 5-6 weeks.

## 2020-10-20 ENCOUNTER — Encounter: Payer: Self-pay | Admitting: Physical Therapy

## 2020-10-20 ENCOUNTER — Other Ambulatory Visit: Payer: Self-pay

## 2020-10-20 ENCOUNTER — Ambulatory Visit: Payer: No Typology Code available for payment source | Attending: Family Medicine | Admitting: Physical Therapy

## 2020-10-20 DIAGNOSIS — M357 Hypermobility syndrome: Secondary | ICD-10-CM | POA: Diagnosis not present

## 2020-10-20 DIAGNOSIS — M6281 Muscle weakness (generalized): Secondary | ICD-10-CM | POA: Insufficient documentation

## 2020-10-20 DIAGNOSIS — M25511 Pain in right shoulder: Secondary | ICD-10-CM | POA: Diagnosis not present

## 2020-10-20 DIAGNOSIS — M25512 Pain in left shoulder: Secondary | ICD-10-CM | POA: Insufficient documentation

## 2020-10-20 DIAGNOSIS — G8929 Other chronic pain: Secondary | ICD-10-CM | POA: Insufficient documentation

## 2020-10-20 NOTE — Therapy (Signed)
[place], [place], [address] Phone: [phone]   Fax:  [phone]  Physical Therapy Treatment  Patient Details  Name: Marguerita Stapp MRN: [id number] Date of Birth: 04-May-1986 Referring Provider (PT): Darrelyn Hillock, DO   Encounter Date: 10/20/2020   PT End of Session - 10/20/20 1453    Visit Number 2    Number of Visits 13    Date for PT Re-Evaluation 11/15/20    Authorization Type Focus, no longer has MCD per patient    PT Start Time 6962   late   PT Stop Time 1538    PT Time Calculation (min) 43 min    Activity Tolerance Patient tolerated treatment well    Behavior During Therapy [facility] for tasks assessed/performed           Past Medical History:  Diagnosis Date  . Atopic dermatitis 09/25/2020  . Breast skin changes 12/12/2016  . Fatigue 02/01/2017  . Genital warts   . Low thyroid stimulating hormone (TSH) level 02/01/2017  . Shoulder pain, bilateral 01/06/2015    Past Surgical History:  Procedure Laterality Date  . LASER ABLATION OF CONDYLOMAS      There were no vitals filed for this visit.   Subjective Assessment - 10/20/20 1456    Subjective Not much pain today, 1/10 in rt shoulder. Saw MD but it was too early to report any change.    Currently in Pain? Yes    Pain Score 1     Pain Location Shoulder    Pain Orientation Right    Pain Descriptors / Indicators Discomfort    Pain Type Chronic pain    Pain Onset More than a month ago    Pain Frequency Intermittent    Aggravating Factors  PM, pulling and pushing    Pain Relieving Factors rest    Multiple Pain Sites No               [facility] Adult PT Treatment/Exercise - 10/20/20 0001      Shoulder Exercises: Supine   Horizontal ABduction Weight (lbs) red band, painful    External Rotation Strengthening;AAROM;Right    Theraband Level (Shoulder External Rotation) Level 2 (Red)    External Rotation Weight (lbs) red band and also with UE  ranger    Flexion Both;15 reps    Shoulder Flexion Weight (lbs) UE ranger supine    Other Supine Exercises chest press cane x 10      Shoulder Exercises: Seated   Retraction 10 reps      Shoulder Exercises: Sidelying   External Rotation Strengthening;Right;15 reps    External Rotation Weight (lbs) 1    Flexion 10 reps;Right    ABduction Strengthening;Right;10 reps      Shoulder Exercises: Pulleys   Flexion 2 minutes    Scaption 2 minutes    Scaption Limitations pain    Other Pulley Exercises standing for horizontal abd      Shoulder Exercises: ROM/Strengthening   Other ROM/Strengthening Exercises lower trap strength seated red band x 10      Cryotherapy   Number Minutes Cryotherapy 10 Minutes    Cryotherapy Location Shoulder    Type of Cryotherapy Ice pack                  PT Education - 10/20/20 2015    Education Details HEP , stability of scapula and shoulder    Person(s) Educated Patient    Methods Explanation  Comprehension Verbalized understanding;Returned demonstration               PT Long Term Goals - 10/04/20 1216      PT LONG TERM GOAL #1   Title Pt will be I with HEP for bilateral shoulder stability, strength    Baseline unknown    Time 6    Period Weeks    Status New    Target Date 11/15/20      PT LONG TERM GOAL #2   Title Pt will be more aware and mindful of joint positions, posture related to ADLs and work    Time 6    Period Weeks    Status New    Target Date 11/15/20      PT LONG TERM GOAL #3   Title Pt will demo no pain with overhead and cross body reaching(>120 deg) for home tasks, dressing, work    Time 6    Period Weeks    Status New    Target Date 11/15/20      PT LONG TERM GOAL #4   Title Pt will report pain at work no more than 3/10 with normal work duties (pulling , lifting)    Time 6    Period Weeks    Status New    Target Date 11/15/20                 Plan - 10/20/20 1453    Clinical Impression  Statement Pt. cont with same symptoms, had been doing exercises incorrectly.  Reduced band tension for more success.  Pain with AROM in all planes in Rt shoulder, strengthening, up to 6/10.  More comfortanble in hooklying than previous.    PT Treatment/Interventions ADLs/Self Care Home Management;Cryotherapy;Therapeutic exercise;Patient/family education;Taping;Functional mobility training;Iontophoresis 4mg /ml Dexamethasone;Moist Heat;Ultrasound;Passive range of motion;Neuromuscular re-education;Therapeutic activities;Manual techniques;Electrical Stimulation    PT Next Visit Plan HEP, add stability for scapula (closed chain) , modalities as needed    PT Home Exercise Plan retraction, ER with green/red band, standing wall    Consulted and Agree with Plan of Care Patient           Patient will benefit from skilled therapeutic intervention in order to improve the following deficits and impairments:  Decreased mobility,Pain,Impaired UE functional use,Increased fascial restricitons,Decreased strength,Hypermobility  Visit Diagnosis: Chronic right shoulder pain  Chronic left shoulder pain  Muscle weakness (generalized)  Hypermobility syndrome     Problem List Patient Active Problem List   Diagnosis Date Noted  . Cough in adult 09/25/2020  . Atopic dermatitis 09/25/2020  . Impingement syndrome of right shoulder 09/06/2020  . Intramural uterine fibroid 02/26/2020  . Urge incontinence 11/11/2019  . Palpitations 07/18/2017  . Hyperthyroidism 12/12/2016  . Allergic rhinitis 01/06/2015    PAA,JENNIFER 10/20/2020, 8:18 PM  [facility] [address] [place], [place], [address] Phone: [phone]   Fax:  [phone]  Name: Joycelyn Liska MRN: [id number] Date of Birth: Jun 12, 1986  Raeford Razor, PT 10/20/20 8:18 PM Phone: [phone] Fax: [phone]

## 2020-10-25 ENCOUNTER — Ambulatory Visit: Payer: No Typology Code available for payment source | Admitting: Physical Therapy

## 2020-10-25 ENCOUNTER — Encounter: Payer: Self-pay | Admitting: Physical Therapy

## 2020-10-25 ENCOUNTER — Other Ambulatory Visit: Payer: Self-pay

## 2020-10-25 DIAGNOSIS — M357 Hypermobility syndrome: Secondary | ICD-10-CM | POA: Diagnosis not present

## 2020-10-25 DIAGNOSIS — G8929 Other chronic pain: Secondary | ICD-10-CM | POA: Diagnosis not present

## 2020-10-25 DIAGNOSIS — M25511 Pain in right shoulder: Secondary | ICD-10-CM

## 2020-10-25 DIAGNOSIS — M6281 Muscle weakness (generalized): Secondary | ICD-10-CM

## 2020-10-25 DIAGNOSIS — M25512 Pain in left shoulder: Secondary | ICD-10-CM | POA: Diagnosis not present

## 2020-10-25 NOTE — Therapy (Signed)
[place], [place], [address] Phone: [phone]   Fax:  [phone]  Physical Therapy Treatment  Patient Details  Name: Caroline Jensen MRN: [id number] Date of Birth: Nov 26, 1985 Referring Provider (PT): Darrelyn Hillock, DO   Encounter Date: 10/25/2020   PT End of Session - 10/25/20 1023    Visit Number 3    Number of Visits 13    Date for PT Re-Evaluation 11/15/20    Authorization Type Focus, no longer has MCD per patient    PT Start Time 1018    PT Stop Time 1056    PT Time Calculation (min) 38 min           Past Medical History:  Diagnosis Date  . Atopic dermatitis 09/25/2020  . Breast skin changes 12/12/2016  . Fatigue 02/01/2017  . Genital warts   . Low thyroid stimulating hormone (TSH) level 02/01/2017  . Shoulder pain, bilateral 01/06/2015    Past Surgical History:  Procedure Laterality Date  . LASER ABLATION OF CONDYLOMAS      There were no vitals filed for this visit.   Subjective Assessment - 10/25/20 1022    Subjective Pain is worse in the morning and at night. I sleep on my sides. No pain really right now.    Pertinent History chronic shoulder pain, hypermobility of joints, COVID 1/22.    Currently in Pain? No/denies                             [facility] Adult PT Treatment/Exercise - 10/25/20 0001      Shoulder Exercises: Supine   Horizontal ABduction 15 reps    Horizontal ABduction Limitations popping  on right shoulder with eccentric    External Rotation 20 reps    Theraband Level (Shoulder External Rotation) Level 2 (Red)    External Rotation Limitations no pain    Other Supine Exercises red band pullover x 10 -shoulder width tension      Shoulder Exercises: Prone   Other Prone Exercises Quadruped rocking 10 sec x 10, forward and back, side to side      Shoulder Exercises: Sidelying   External Rotation Strengthening;Right;15 reps    External Rotation Weight  (lbs) 1    Flexion 10 reps;Right    ABduction Strengthening;Right;5 reps    ABduction Weight (lbs) too uncomfortable, discontinued      Shoulder Exercises: Standing   Row 10 reps;Both    Theraband Level (Shoulder Row) Level 2 (Red)      Shoulder Exercises: Pulleys   Flexion 2 minutes    Scaption 2 minutes    Scaption Limitations uncomfortable on left    Other Pulley Exercises standing for horizontal abd      Shoulder Exercises: ROM/Strengthening   UBE (Upper Arm Bike) L1 2 minutes forward, 2 minutes backward                       PT Long Term Goals - 10/04/20 1216      PT LONG TERM GOAL #1   Title Pt will be I with HEP for bilateral shoulder stability, strength    Baseline unknown    Time 6    Period Weeks    Status New    Target Date 11/15/20      PT LONG TERM GOAL #2   Title Pt will be more aware and mindful of joint positions, posture related to  ADLs and work    Time 6    Period Weeks    Status New    Target Date 11/15/20      PT LONG TERM GOAL #3   Title Pt will demo no pain with overhead and cross body reaching(>120 deg) for home tasks, dressing, work    Time 6    Period Weeks    Status New    Target Date 11/15/20      PT LONG TERM GOAL #4   Title Pt will report pain at work no more than 3/10 with normal work duties (pulling , lifting)    Time 6    Period Weeks    Status New    Target Date 11/15/20                 Plan - 10/25/20 1058    Clinical Impression Statement Pt reports compliance with HEP. No pain upon arrival. Continued strength and stabilization with quadruped rocking and weightshifting. She had no pain with this but felt weakness on right. Continued red bands for RTC and scap strength. She had intermittent increased right shoulder pain with therex. Afterward she reported right shoulder felt weak, no pain.    PT Next Visit Plan HEP, add stability for scapula (closed chain) , modalities as needed    PT Home Exercise Plan  retraction, ER with green/red band, standing wall           Patient will benefit from skilled therapeutic intervention in order to improve the following deficits and impairments:  Decreased mobility,Pain,Impaired UE functional use,Increased fascial restricitons,Decreased strength,Hypermobility  Visit Diagnosis: Chronic right shoulder pain  Chronic left shoulder pain  Muscle weakness (generalized)  Hypermobility syndrome     Problem List Patient Active Problem List   Diagnosis Date Noted  . Cough in adult 09/25/2020  . Atopic dermatitis 09/25/2020  . Impingement syndrome of right shoulder 09/06/2020  . Intramural uterine fibroid 02/26/2020  . Urge incontinence 11/11/2019  . Palpitations 07/18/2017  . Hyperthyroidism 12/12/2016  . Allergic rhinitis 01/06/2015    Dorene Ar , PTA 10/25/2020, 11:02 AM  [facility] [address] [place], [place], [address] Phone: [phone]   Fax:  [phone]  Name: Kaylynn Chamblin MRN: [id number] Date of Birth: Oct 27, 1985

## 2020-10-27 ENCOUNTER — Encounter: Payer: Self-pay | Admitting: Physical Therapy

## 2020-10-27 ENCOUNTER — Other Ambulatory Visit: Payer: Self-pay

## 2020-10-27 ENCOUNTER — Ambulatory Visit: Payer: No Typology Code available for payment source | Admitting: Physical Therapy

## 2020-10-27 DIAGNOSIS — M25512 Pain in left shoulder: Secondary | ICD-10-CM | POA: Diagnosis not present

## 2020-10-27 DIAGNOSIS — M6281 Muscle weakness (generalized): Secondary | ICD-10-CM

## 2020-10-27 DIAGNOSIS — M357 Hypermobility syndrome: Secondary | ICD-10-CM

## 2020-10-27 DIAGNOSIS — G8929 Other chronic pain: Secondary | ICD-10-CM

## 2020-10-27 DIAGNOSIS — M25511 Pain in right shoulder: Secondary | ICD-10-CM | POA: Diagnosis not present

## 2020-10-27 NOTE — Therapy (Signed)
[place], [place], [address] Phone: [phone]   Fax:  [phone]  Physical Therapy Treatment  Patient Details  Name: Caroline Jensen MRN: [id number] Date of Birth: 08-22-1986 Referring Provider (PT): Darrelyn Hillock, DO   Encounter Date: 10/27/2020   PT End of Session - 10/27/20 1559    Visit Number 4    Number of Visits 13    Date for PT Re-Evaluation 11/15/20    Authorization Type Focus, no longer has MCD per patient    PT Start Time 1538    PT Stop Time 1625    PT Time Calculation (min) 47 min    Activity Tolerance Patient tolerated treatment well    Behavior During Therapy [facility] for tasks assessed/performed           Past Medical History:  Diagnosis Date  . Atopic dermatitis 09/25/2020  . Breast skin changes 12/12/2016  . Fatigue 02/01/2017  . Genital warts   . Low thyroid stimulating hormone (TSH) level 02/01/2017  . Shoulder pain, bilateral 01/06/2015    Past Surgical History:  Procedure Laterality Date  . LASER ABLATION OF CONDYLOMAS      There were no vitals filed for this visit.   Subjective Assessment - 10/27/20 1541    Subjective No pain today, my neck has been a little sor e    Currently in Pain? No/denies               [facility] Adult PT Treatment/Exercise - 10/27/20 0001      Shoulder Exercises: Supine   Other Supine Exercises skull crushers (triceps) 2 lbs x 10    Other Supine Exercises circles x 10 each direction 2 lbs      Shoulder Exercises: Sidelying   External Rotation Strengthening;Both;10 reps;Weights    External Rotation Weight (lbs) 2, 2 sets    ABduction Weight (lbs) isometric hold x 30 sec x 3 , 2 lbs      Shoulder Exercises: Standing   Protraction Strengthening;Both;10 reps    Protraction Weight (lbs) and retraction      Shoulder Exercises: ROM/Strengthening   UBE (Upper Arm Bike) L 1 , 5 min in reverse    Wall Pushups 10 reps    Wall Pushups Limitations  cues for neutral scapula    Other ROM/Strengthening Exercises looped band ER press out then iso ER with shoulder raise    Other ROM/Strengthening Exercises scapular stability ex in supine      Shoulder Exercises: Stretch   Other Shoulder Stretches quadruped stability : UE flexion alternating and then horizontal 2 x 5 each arm .                  PT Education - 10/27/20 1834    Education Details side sleeping with enck support, arm tucked under rather than overhead    Person(s) Educated Patient    Methods Explanation;Demonstration    Comprehension Verbalized understanding;Returned demonstration               PT Long Term Goals - 10/27/20 1844      PT LONG TERM GOAL #1   Title Pt will be I with HEP for bilateral shoulder stability, strength    Status On-going      PT LONG TERM GOAL #2   Title Pt will be more aware and mindful of joint positions, posture related to ADLs and work    Status On-going      PT LONG TERM GOAL #  3   Title Pt will demo no pain with overhead and cross body reaching(>120 deg) for home tasks, dressing, work    Status On-going      PT LONG TERM GOAL #4   Title Pt will report pain at work no more than 3/10 with normal work duties (pulling , lifting)    Status On-going                 Plan - 10/27/20 1604    Clinical Impression Statement Increased pain in shoulders today with exercises.  She has a habit of laying on her side with arm overhead, discussed trying other options for sidelying for sleep.  Difficulty maintaining scapular position with quadruped, needs cues throughout session. Used MHP post session for pain relief.    PT Treatment/Interventions ADLs/Self Care Home Management;Cryotherapy;Therapeutic exercise;Patient/family education;Taping;Functional mobility training;Iontophoresis 4mg /ml Dexamethasone;Moist Heat;Ultrasound;Passive range of motion;Neuromuscular re-education;Therapeutic activities;Manual techniques;Electrical  Stimulation    PT Next Visit Plan HEP, add stability for scapula (closed chain) , modalities as needed. consider tape to shoulder    PT Home Exercise Plan retraction, ER with green/red band, standing wall    Consulted and Agree with Plan of Care Patient           Patient will benefit from skilled therapeutic intervention in order to improve the following deficits and impairments:  Decreased mobility,Pain,Impaired UE functional use,Increased fascial restricitons,Decreased strength,Hypermobility  Visit Diagnosis: Chronic right shoulder pain  Chronic left shoulder pain  Muscle weakness (generalized)  Hypermobility syndrome     Problem List Patient Active Problem List   Diagnosis Date Noted  . Cough in adult 09/25/2020  . Atopic dermatitis 09/25/2020  . Impingement syndrome of right shoulder 09/06/2020  . Intramural uterine fibroid 02/26/2020  . Urge incontinence 11/11/2019  . Palpitations 07/18/2017  . Hyperthyroidism 12/12/2016  . Allergic rhinitis 01/06/2015    Caroline Jensen 10/27/2020, 6:45 PM  [facility] [address] [place], [place], [address] Phone: [phone]   Fax:  [phone]  Name: Caroline Jensen MRN: [id number] Date of Birth: 18-Mar-1986   Raeford Razor, PT 10/27/20 6:46 PM Phone: [phone] Fax: [phone]

## 2020-11-01 ENCOUNTER — Other Ambulatory Visit: Payer: Self-pay

## 2020-11-01 ENCOUNTER — Ambulatory Visit: Payer: No Typology Code available for payment source | Admitting: Physical Therapy

## 2020-11-01 ENCOUNTER — Encounter: Payer: Self-pay | Admitting: Physical Therapy

## 2020-11-01 DIAGNOSIS — G8929 Other chronic pain: Secondary | ICD-10-CM

## 2020-11-01 DIAGNOSIS — M6281 Muscle weakness (generalized): Secondary | ICD-10-CM

## 2020-11-01 DIAGNOSIS — M25512 Pain in left shoulder: Secondary | ICD-10-CM

## 2020-11-01 DIAGNOSIS — M357 Hypermobility syndrome: Secondary | ICD-10-CM | POA: Diagnosis not present

## 2020-11-01 DIAGNOSIS — M25511 Pain in right shoulder: Secondary | ICD-10-CM | POA: Diagnosis not present

## 2020-11-01 NOTE — Therapy (Signed)
[place], [place], [address] Phone: [phone]   Fax:  [phone]  Physical Therapy Treatment  Patient Details  Name: Caroline Jensen MRN: [id number] Date of Birth: Dec 19, 1985 Referring Provider (PT): Darrelyn Hillock, DO   Encounter Date: 11/01/2020   PT End of Session - 11/01/20 1135    Visit Number 5    Number of Visits 13    Date for PT Re-Evaluation 11/15/20    Authorization Type Focus, no longer has MCD per patient    PT Start Time 1105    PT Stop Time 1143    PT Time Calculation (min) 38 min           Past Medical History:  Diagnosis Date  . Atopic dermatitis 09/25/2020  . Breast skin changes 12/12/2016  . Fatigue 02/01/2017  . Genital warts   . Low thyroid stimulating hormone (TSH) level 02/01/2017  . Shoulder pain, bilateral 01/06/2015    Past Surgical History:  Procedure Laterality Date  . LASER ABLATION OF CONDYLOMAS      There were no vitals filed for this visit.   Subjective Assessment - 11/01/20 1106    Subjective No pain now. 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 I also had discomfort with holding a small baby.    Currently in Pain? No/denies    Pain Score 0-No pain    Pain Location Shoulder    Pain Orientation Right    Aggravating Factors  lifting, carrying/holding a baby, hurts at night    Pain Relieving Factors rest                             [facility] Adult PT Treatment/Exercise - 11/01/20 0001      Shoulder Exercises: Supine   Protraction 20 reps;Both    Protraction Weight (lbs) 2    Horizontal ABduction 10 reps   2 sets   Theraband Level (Shoulder Horizontal ABduction) Level 2 (Red)    External Rotation 20 reps    Theraband Level (Shoulder External Rotation) Level 2 (Red)    Other Supine Exercises skull crushers 2 lbs x 10    Other Supine Exercises red band pullover x 10 -shoulder width tension, 2#  circles 2 x 10 at chest level   x2     Shoulder Exercises: Prone   Other Prone Exercises quadruped stability : UE flexion alternating and then horizontal 2 x 5 each arm .      Shoulder Exercises: Sidelying   External Rotation Strengthening;Both;10 reps;Weights    External Rotation Weight (lbs) 2, 2 sets      Shoulder Exercises: Standing   Protraction Strengthening;Both;10 reps    Protraction Weight (lbs) and retraction on wall    Row 10 reps;Both    Theraband Level (Shoulder Row) Level 2 (Red)      Shoulder Exercises: ROM/Strengthening   UBE (Upper Arm Bike) L 1 , 5 min last 3  min reverse    Wall Pushups 10 reps    Wall Pushups Limitations cues for neutral scapula      Shoulder Exercises: Stretch   Other Shoulder Stretches --                       PT Long Term Goals - 10/27/20 1844      PT LONG TERM GOAL #1   Title  Pt will be I with HEP for bilateral shoulder stability, strength    Status On-going      PT LONG TERM GOAL #2   Title Pt will be more aware and mindful of joint positions, posture related to ADLs and work    Status On-going      PT LONG TERM GOAL #3   Title Pt will demo no pain with overhead and cross body reaching(>120 deg) for home tasks, dressing, work    Status On-going      PT LONG TERM GOAL #4   Title Pt will report pain at work no more than 3/10 with normal work duties (pulling , lifting)    Status On-going                 Plan - 11/01/20 1143    Clinical Impression Statement Pt reports increased  right shoulder pain with lifting a pateient's LE at work. She tolerated todays sesson well with pain only during sidelying ER strengthening, described as burning.    PT Next Visit Plan HEP, add stability for scapula (closed chain) , modalities as needed. consider tape to shoulder    PT Home Exercise Plan retraction, ER with green/red band, standing wall           Patient will benefit from skilled therapeutic intervention in order  to improve the following deficits and impairments:  Decreased mobility,Pain,Impaired UE functional use,Increased fascial restricitons,Decreased strength,Hypermobility  Visit Diagnosis: Chronic right shoulder pain  Chronic left shoulder pain  Hypermobility syndrome  Muscle weakness (generalized)     Problem List Patient Active Problem List   Diagnosis Date Noted  . Cough in adult 09/25/2020  . Atopic dermatitis 09/25/2020  . Impingement syndrome of right shoulder 09/06/2020  . Intramural uterine fibroid 02/26/2020  . Urge incontinence 11/11/2019  . Palpitations 07/18/2017  . Hyperthyroidism 12/12/2016  . Allergic rhinitis 01/06/2015    Dorene Ar, PTA 11/01/2020, 11:45 AM  [facility] [address] [place], [place], [address] Phone: [phone]   Fax:  [phone]  Name: Caroline Jensen MRN: [id number] Date of Birth: 1986-05-09

## 2020-11-03 ENCOUNTER — Ambulatory Visit: Payer: No Typology Code available for payment source | Attending: Family Medicine | Admitting: Physical Therapy

## 2020-11-03 ENCOUNTER — Other Ambulatory Visit: Payer: Self-pay

## 2020-11-03 ENCOUNTER — Encounter: Payer: Self-pay | Admitting: Physical Therapy

## 2020-11-03 DIAGNOSIS — M6281 Muscle weakness (generalized): Secondary | ICD-10-CM | POA: Diagnosis not present

## 2020-11-03 DIAGNOSIS — G8929 Other chronic pain: Secondary | ICD-10-CM

## 2020-11-03 DIAGNOSIS — M357 Hypermobility syndrome: Secondary | ICD-10-CM | POA: Diagnosis not present

## 2020-11-03 DIAGNOSIS — M25511 Pain in right shoulder: Secondary | ICD-10-CM | POA: Insufficient documentation

## 2020-11-03 DIAGNOSIS — M25512 Pain in left shoulder: Secondary | ICD-10-CM | POA: Diagnosis not present

## 2020-11-03 NOTE — Therapy (Signed)
[place], [place], [address] Phone: [phone]   Fax:  [phone]  Physical Therapy Treatment  Patient Details  Name: Caroline Jensen MRN: [id number] Date of Birth: 07-Dec-1985 Referring Provider (PT): Darrelyn Hillock, DO   Encounter Date: 11/03/2020   PT End of Session - 11/03/20 1324    Visit Number 6    Number of Visits 13    Date for PT Re-Evaluation 11/15/20    Authorization Type Focus, no longer has MCD per patient    PT Start Time 8502    PT Stop Time 1400    PT Time Calculation (min) 39 min    Activity Tolerance Patient tolerated treatment well    Behavior During Therapy [facility] for tasks assessed/performed           Past Medical History:  Diagnosis Date  . Atopic dermatitis 09/25/2020  . Breast skin changes 12/12/2016  . Fatigue 02/01/2017  . Genital warts   . Low thyroid stimulating hormone (TSH) level 02/01/2017  . Shoulder pain, bilateral 01/06/2015    Past Surgical History:  Procedure Laterality Date  . LASER ABLATION OF CONDYLOMAS      There were no vitals filed for this visit.   Subjective Assessment - 11/03/20 1323    Subjective Shoulders were sore after last session. No pain now.    Currently in Pain? No/denies              [facility] PT Assessment - 11/03/20 0001      AROM   Right Shoulder Flexion 132 Degrees    Right Shoulder ABduction 135 Degrees    Left Shoulder Flexion 142 Degrees    Left Shoulder ABduction 135 Degrees      Strength   Right Shoulder Flexion 4+/5    Right Shoulder ABduction 4+/5    Right Shoulder External Rotation 4/5    Left Shoulder Flexion 4+/5    Left Shoulder ABduction 4/5    Left Shoulder External Rotation 4+/5                         [facility] Adult PT Treatment/Exercise - 11/03/20 0001      Shoulder Exercises: Supine   Protraction 20 reps;Both    Protraction Weight (lbs) 2    Horizontal ABduction 20 reps    Theraband Level  (Shoulder Horizontal ABduction) Level 3 (Green)    Other Supine Exercises skull crushers 2 lbs x 20    Other Supine Exercises 2# arm circles at chest level      Shoulder Exercises: Prone   Other Prone Exercises quadruped stability : UE flexion alternating and then horizontal 1 x 10 each arm .      Shoulder Exercises: Standing   Protraction Strengthening;Both;10 reps    Protraction Weight (lbs) and retraction on wall    External Rotation 15 reps;Right;Left    Theraband Level (Shoulder External Rotation) Level 2 (Red)    Internal Rotation 15 reps;Right;Left    Theraband Level (Shoulder Internal Rotation) Level 2 (Red)    Row 20 reps    Theraband Level (Shoulder Row) Level 3 (Green)    Other Standing Exercises wall push up.- tricep    Other Standing Exercises Bicep curls 3# x 10      Shoulder Exercises: ROM/Strengthening   UBE (Upper Arm Bike) L 1 , 5 min last 3  min reverse  PT Long Term Goals - 10/27/20 1844      PT LONG TERM GOAL #1   Title Pt will be I with HEP for bilateral shoulder stability, strength    Status On-going      PT LONG TERM GOAL #2   Title Pt will be more aware and mindful of joint positions, posture related to ADLs and work    Status On-going      PT LONG TERM GOAL #3   Title Pt will demo no pain with overhead and cross body reaching(>120 deg) for home tasks, dressing, work    Status On-going      PT LONG TERM GOAL #4   Title Pt will report pain at work no more than 3/10 with normal work duties (pulling , lifting)    Status On-going                 Plan - 11/03/20 1342    Clinical Impression Statement Pt reports soreness after last session. She has no pain at start of session. Her MMT has improved bilateral. 3# bicep curls challenging and painful in posterior shoulders. Otherwise she tolerated increased repetitions with improved endurance compared to last session.    PT Next Visit Plan PROGRESS HEP, add stability  for scapula (closed chain) , modalities as needed. consider tape to shoulder    PT Home Exercise Plan retraction, ER with green/red band, standing wall           Patient will benefit from skilled therapeutic intervention in order to improve the following deficits and impairments:  Decreased mobility,Pain,Impaired UE functional use,Increased fascial restricitons,Decreased strength,Hypermobility  Visit Diagnosis: Chronic right shoulder pain  Chronic left shoulder pain  Hypermobility syndrome  Muscle weakness (generalized)     Problem List Patient Active Problem List   Diagnosis Date Noted  . Cough in adult 09/25/2020  . Atopic dermatitis 09/25/2020  . Impingement syndrome of right shoulder 09/06/2020  . Intramural uterine fibroid 02/26/2020  . Urge incontinence 11/11/2019  . Palpitations 07/18/2017  . Hyperthyroidism 12/12/2016  . Allergic rhinitis 01/06/2015    Dorene Ar, PTA 11/03/2020, 2:02 PM  [facility] [address] [place], [place], [address] Phone: [phone]   Fax:  [phone]  Name: Caroline Jensen MRN: [id number] Date of Birth: 11/22/1985

## 2020-11-04 ENCOUNTER — Emergency Department (HOSPITAL_COMMUNITY)
Admission: EM | Admit: 2020-11-04 | Discharge: 2020-11-04 | Disposition: A | Discharge status: Home / Self Care | Payer: No Typology Code available for payment source | Attending: Emergency Medicine | Admitting: Emergency Medicine

## 2020-11-04 ENCOUNTER — Encounter (HOSPITAL_COMMUNITY): Payer: Self-pay

## 2020-11-04 ENCOUNTER — Emergency Department (HOSPITAL_COMMUNITY): Payer: No Typology Code available for payment source

## 2020-11-04 DIAGNOSIS — R55 Syncope and collapse: Secondary | ICD-10-CM | POA: Insufficient documentation

## 2020-11-04 DIAGNOSIS — M6281 Muscle weakness (generalized): Secondary | ICD-10-CM | POA: Diagnosis not present

## 2020-11-04 DIAGNOSIS — R42 Dizziness and giddiness: Secondary | ICD-10-CM | POA: Diagnosis not present

## 2020-11-04 LAB — URINALYSIS, ROUTINE W REFLEX MICROSCOPIC
Bilirubin Urine: NEGATIVE
Glucose, UA: NEGATIVE mg/dL
Ketones, ur: NEGATIVE mg/dL
Leukocytes,Ua: NEGATIVE
Nitrite: NEGATIVE
Protein, ur: NEGATIVE mg/dL
Specific Gravity, Urine: 1.006 (ref 1.005–1.030)
pH: 8 (ref 5.0–8.0)

## 2020-11-04 LAB — I-STAT BETA HCG BLOOD, ED (MC, WL, AP ONLY)
I-stat hCG, quantitative: <5 m[IU]/mL (ref <5)
I-stat hCG, quantitative: <5 m[IU]/mL (ref <5)

## 2020-11-04 LAB — BASIC METABOLIC PANEL
Anion gap: 9 (ref 5–15)
BUN: 8 mg/dL (ref 6–20)
CO2: 22 mmol/L (ref 22–32)
Calcium: 9 mg/dL (ref 8.9–10.3)
Chloride: 106 mmol/L (ref 98–111)
Creatinine, Ser: 0.95 mg/dL (ref 0.44–1.00)
GFR, Estimated: >60 mL/min (ref >60)
Glucose, Bld: 83 mg/dL (ref 70–99)
Potassium: 3.7 mmol/L (ref 3.5–5.1)
Sodium: 137 mmol/L (ref 135–145)

## 2020-11-04 LAB — CBC
HCT: 34.2 % — ABNORMAL LOW (ref 36.0–46.0)
Hemoglobin: 12 g/dL (ref 12.0–15.0)
MCH: 32.1 pg (ref 26.0–34.0)
MCHC: 35.1 g/dL (ref 30.0–36.0)
MCV: 91.4 fL (ref 80.0–100.0)
Platelets: 216 10*3/uL (ref 150–400)
RBC: 3.74 MIL/uL — ABNORMAL LOW (ref 3.87–5.11)
RDW: 12.9 % (ref 11.5–15.5)
WBC: 4.2 10*3/uL (ref 4.0–10.5)
nRBC: 0 % (ref 0.0–0.2)

## 2020-11-04 LAB — CBG MONITORING, ED
Glucose-Capillary: 62 mg/dL — ABNORMAL LOW (ref 70–99)
Glucose-Capillary: 84 mg/dL (ref 70–99)

## 2020-11-04 MED ORDER — SODIUM CHLORIDE 0.9 % IV BOLUS
1000.0000 mL | Freq: Once | INTRAVENOUS | Status: AC
  Administered 2020-11-04: 1000 mL via INTRAVENOUS

## 2020-11-04 NOTE — Consult Note (Signed)
Consult Note  Patient Name: Caroline Jensen Date of Encounter: 11/04/2020  Primary Cardiologist: Dr Irish Lack  Subjective   Ms Caroline Jensen is a 35 year old female with PMHx of subclinical hyperthyroidism presenting with presyncopal episode. 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 She reports feeling lightheaded and weak after using the bathroom. She was able to walk to the nurse station and put her head down on the desk. She continued to feel weak and like the room was spinning. She was helped to a wheelchair and brought to the ED.   In the ED, patient normotensive but with bradycardia 40-50s. She does continue to have some generalized weakness. BMP and CBC wnl. b-HCG negative. Urinalysis with moderate hemoglobinuria but otherwise negative. MRI Brain obtained with nonspecific punctate foci within anterior left frontal lobe white matter and left temporal occipital white matter.   Inpatient Medications    Scheduled Meds: None Continuous Infusions: None  PRN Meds:  None   Vital Signs    Vitals:   11/04/20 1200 11/04/20 1230 11/04/20 1300 11/04/20 1430  BP: 116/86 109/78 104/73 106/74  Pulse: (!) 46 (!) 46 (!) 47 (!) 49  Resp: 13 12 15 16   Temp:      TempSrc:      SpO2: 100% 100% 100% 97%    Intake/Output Summary (Last 24 hours) at 11/04/2020 1446 Last data filed at 11/04/2020 1142 Gross per 24 hour  Intake 1000 ml  Output --  Net 1000 ml   There were no vitals filed for this visit.  Telemetry    Sinus bradycardia - Personally Reviewed  ECG    Sinus bradycardia, HR 46, no evidence of heart block  - Personally Reviewed  Physical Exam   Constitutional: Young, well-developed and well-nourished. No distress.  HENT: Normocephalic and atraumatic, EOMI, conjunctiva normal, moist mucous membranes Cardiovascular: Normal rate, regular rhythm, S1 and S2 present, no murmurs, rubs, gallops.  Distal pulses intact Respiratory: No respiratory distress, no accessory muscle use.   Effort is normal.  Lungs are clear to auscultation bilaterally. GI: Nondistended, soft, nontender to palpation, active bowel sounds Musculoskeletal: Normal bulk and tone.  No peripheral edema noted. Neurological: Is alert and oriented x4, no apparent focal deficits noted. Skin: Warm and dry.  No rash, erythema, lesions noted. Psychiatric: Normal mood and affect. Behavior is normal. Judgment and thought content normal.   Labs    Chemistry Recent Labs  Lab 11/04/20 0215  NA 137  K 3.7  CL 106  CO2 22  GLUCOSE 83  BUN 8  CREATININE 0.95  CALCIUM 9.0  GFRNONAA >60  ANIONGAP 9     Hematology Recent Labs  Lab 11/04/20 0215  WBC 4.2  RBC 3.74*  HGB 12.0  HCT 34.2*  MCV 91.4  MCH 32.1  MCHC 35.1  RDW 12.9  PLT 216    Cardiac EnzymesNo results for input(s): TROPONINI in the last 168 hours. No results for input(s): TROPIPOC in the last 168 hours.   BNPNo results for input(s): BNP, PROBNP in the last 168 hours.   DDimer No results for input(s): DDIMER in the last 168 hours.   Radiology    MR BRAIN WO CONTRAST  Result Date: 11/04/2020 CLINICAL DATA:  Neuro deficit, acute, stroke suspected. Additional history provided: Syncopal episode, no head trauma, generalized weakness, leg pain. EXAM: MRI HEAD WITHOUT CONTRAST TECHNIQUE: Multiplanar, multiecho pulse sequences of the brain and surrounding structures were obtained without intravenous contrast. COMPARISON:  Head CT 07/21/2010. FINDINGS:  Brain: Cerebral volume is normal. No cord encephalomalacia is identified. Nonspecific punctate foci of T2/FLAIR hyperintensity within the anterior left frontal lobe white matter (series 6, image 13) and within the left temporal occipital white matter (series 6, image 11). There is no acute infarct. No evidence of intracranial mass. No chronic intracranial blood products. No extra-axial fluid collection. No midline shift. Vascular: Expected proximal arterial flow voids. Skull and upper cervical  spine: No focal marrow lesion. Sinuses/Orbits: Visualized orbits show no acute finding. No significant paranasal sinus disease. IMPRESSION: No evidence of acute intracranial abnormality. Two nonspecific punctate foci of T2/FLAIR hyperintensity within the left cerebral white matter, one within the left frontal lobe and one within the left temporal-occipital lobes. Otherwise unremarkable noncontrast MRI appearance of the brain. Electronically Signed   By: Kellie Simmering DO   On: 11/04/2020 10:24   Cardiac Studies   Holter monitor 08/16/2017>   Normal sinus rhythm with rare PAC (two total PACs).  PACs did not correlate to palpitations   Patient Profile     35 y.o. female with PMHx of subclinical hyperthyroidism presenting with near-syncope episode.   Assessment & Plan    Near syncope:  Patient presented with near-syncope episode after using the bathroom. Work up negative for infectious etiology. EKG and telemetry with sinus bradycardia. MRI with nonspecific findings. She continues to endorse some lightheadedness/dizziness but otherwise is asymptomatic. Patient has previously been evaluated for intermittent episodes of tachycardia and bradycardia. Holter monitor in December 2018 with NSR with rare PAC's. She has also been worked up for subclinical hyperthyroidism in the past. Previously evaluated by endocrinology. Suspected that she may have autonomously functioning thyroid, recovery phase of spontaneously remitting hyperthyroidism, or early autoimmune thyroiditis. No subsequent follow up recommended. Repeat TSH following this was wnl. She notes that her symptoms subsided following that and has not had any episodes of palpitations over the past month. Cardiovascular and neurologic exam are benign. CBC and BMP wnl. Doubt that her sinus bradycardia is contributing to her symptoms. Initially noted to be hypoglycemic and hypotensive. Suspect that she may be slightly volume down in setting of decreased oral  intake.  - Check thyroid levels  - Continue with fluid resuscitation  - Encourage oral intake as tolerated  - No further cardiac work up indicated at this time  Signed, Harvie Heck, MD Internal Medicine, PGY-2 11/04/2020, 2:46 PM   Pager # [phone]

## 2020-11-04 NOTE — ED Notes (Signed)
RN was notified about CBG. Orange juice was given. Will check CBG again in 37min

## 2020-11-04 NOTE — Discharge Instructions (Addendum)
Please drink plenty of fluids Call your primary care doctor and follow up in 2-4 days. Return if you are worse at any time.

## 2020-11-04 NOTE — ED Notes (Signed)
Pt was given a cup of orange juice for low bs and will re-check bs in 30 mins

## 2020-11-04 NOTE — ED Notes (Signed)
Patient transported to MRI 

## 2020-11-04 NOTE — ED Provider Notes (Signed)
[place] EMERGENCY DEPARTMENT Provider Note   CSN: [id number] Arrival date & time: 11/04/20  0143     History Chief Complaint  Patient presents with  . Loss of Consciousness    Caroline Jensen is a 35 y.o. female.  HPI 35 year old female G3, P3 presents today with near syncope episode.  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  She ate prior to coming into work.  She arrived at 11:00 in the episode occurred around 1230.  She describes feeling very lightheaded.  Her coworkers went to help her to sit down and she had a near syncopal episode.  She felt very weak.  She reports weakness in all extremities but felt that her left lower extremity was weaker than others.  Has some difficulty walking after arrival in the emergency department.  She did not completely lose consciousness.  She did not fall and is not injured.  She has previously been seen for palpitations and had a loop recorder in place that showed heart rate up to 112 (high as the 90s her lowest heart rate at 46.    Past Medical History:  Diagnosis Date  . Atopic dermatitis 09/25/2020  . Breast skin changes 12/12/2016  . Fatigue 02/01/2017  . Genital warts   . Low thyroid stimulating hormone (TSH) level 02/01/2017  . Shoulder pain, bilateral 01/06/2015    Patient Active Problem List   Diagnosis Date Noted  . Cough in adult 09/25/2020  . Atopic dermatitis 09/25/2020  . Impingement syndrome of right shoulder 09/06/2020  . Intramural uterine fibroid 02/26/2020  . Urge incontinence 11/11/2019  . Palpitations 07/18/2017  . Hyperthyroidism 12/12/2016  . Allergic rhinitis 01/06/2015    Past Surgical History:  Procedure Laterality Date  . LASER ABLATION OF CONDYLOMAS       OB History    Gravida  3   Para  3   Term  2   Preterm  1   AB      Living  3     SAB      IAB      Ectopic      Multiple      Live Births  3           Family History  Problem Relation Age of Onset  . Drug  abuse Father   . Cancer Paternal Aunt        leukemia  . Hypertension Maternal Aunt   . Stroke Other        unknown  . Cirrhosis Maternal Grandmother   . Urticaria Brother   . Eczema Son   . Diabetes Neg Hx   . Heart disease Neg Hx   . Allergic rhinitis Neg Hx   . Asthma Neg Hx     Social History   Tobacco Use  . Smoking status: Never Smoker  . Smokeless tobacco: Never Used  Vaping Use  . Vaping Use: Never used  Substance Use Topics  . Alcohol use: No    Alcohol/week: 0.0 standard drinks  . Drug use: No    Home Medications Prior to Admission medications   Medication Sig Start Date End Date Taking? Authorizing Provider  triamcinolone ointment (KENALOG) 0.5 % Apply 1 application topically 2 (two) times daily. For moderate to severe eczema.  Do not use for more than 1 week at a time. 09/24/20  Yes Ganta, Anupa, DO  meloxicam (MOBIC) 15 MG tablet Take 1 tablet (15 mg total) by mouth daily.  Patient not taking: Reported on 11/04/2020 10/19/20   Dene Gentry, MD    Allergies    Patient has no known allergies.  Review of Systems   Review of Systems  All other systems reviewed and are negative.   Physical Exam Updated Vital Signs BP 114/77   Pulse (!) 48   Temp 98.3 F (36.8 C) (Oral)   Resp 17   SpO2 100%   Physical Exam Vitals and nursing note reviewed.  Constitutional:      Appearance: Normal appearance.  HENT:     Head: Normocephalic.     Right Ear: External ear normal.     Left Ear: External ear normal.     Nose: Nose normal.     Mouth/Throat:     Pharynx: Oropharynx is clear.  Eyes:     Pupils: Pupils are equal, round, and reactive to light.  Cardiovascular:     Rate and Rhythm: Normal rate and regular rhythm.     Pulses: Normal pulses.  Pulmonary:     Effort: Pulmonary effort is normal.  Abdominal:     Palpations: Abdomen is soft.  Musculoskeletal:        General: Normal range of motion.     Cervical back: Normal range of motion.  Skin:     General: Skin is warm.     Capillary Refill: Capillary refill takes less than 2 seconds.  Neurological:     General: No focal deficit present.     Mental Status: She is alert and oriented to person, place, and time.     Cranial Nerves: No cranial nerve deficit.     Motor: No weakness.  Psychiatric:        Mood and Affect: Mood normal.     ED Results / Procedures / Treatments   Labs (all labs ordered are listed, but only abnormal results are displayed) Labs Reviewed  CBC - Abnormal; Notable for the following components:      Result Value   RBC 3.74 (*)    HCT 34.2 (*)    All other components within normal limits  URINALYSIS, ROUTINE W REFLEX MICROSCOPIC - Abnormal; Notable for the following components:   Color, Urine STRAW (*)    Hgb urine dipstick MODERATE (*)    Bacteria, UA RARE (*)    All other components within normal limits  CBG MONITORING, ED - Abnormal; Notable for the following components:   Glucose-Capillary 62 (*)    All other components within normal limits  BASIC METABOLIC PANEL  I-STAT BETA HCG BLOOD, ED ([facility], [facility], [facility] ONLY)  CBG MONITORING, ED  I-STAT BETA HCG BLOOD, ED ([facility], [facility], [facility] ONLY)    EKG EKG Interpretation  Date/Time:  Thursday November 04 2020 02:00:20 EST Ventricular Rate:  60 PR Interval:  150 QRS Duration: 68 QT Interval:  430 QTC Calculation: 430 R Axis:   83 Text Interpretation: Normal sinus rhythm with sinus arrhythmia Normal ECG No STEMI Confirmed by Octaviano Glow [phone]) on 11/04/2020 7:09:41 AM   Radiology MR BRAIN WO CONTRAST  Result Date: 11/04/2020 CLINICAL DATA:  Neuro deficit, acute, stroke suspected. Additional history provided: Syncopal episode, no head trauma, generalized weakness, leg pain. EXAM: MRI HEAD WITHOUT CONTRAST TECHNIQUE: Multiplanar, multiecho pulse sequences of the brain and surrounding structures were obtained without intravenous contrast. COMPARISON:  Head CT 07/21/2010. FINDINGS: Brain: Cerebral volume is normal. No  cord encephalomalacia is identified. Nonspecific punctate foci of T2/FLAIR hyperintensity within the anterior left frontal lobe white matter (series  6, image 13) and within the left temporal occipital white matter (series 6, image 11). There is no acute infarct. No evidence of intracranial mass. No chronic intracranial blood products. No extra-axial fluid collection. No midline shift. Vascular: Expected proximal arterial flow voids. Skull and upper cervical spine: No focal marrow lesion. Sinuses/Orbits: Visualized orbits show no acute finding. No significant paranasal sinus disease. IMPRESSION: No evidence of acute intracranial abnormality. Two nonspecific punctate foci of T2/FLAIR hyperintensity within the left cerebral white matter, one within the left frontal lobe and one within the left temporal-occipital lobes. Otherwise unremarkable noncontrast MRI appearance of the brain. Electronically Signed   By: Kellie Simmering DO   On: 11/04/2020 10:24    Procedures Procedures   Medications Ordered in ED Medications  sodium chloride 0.9 % bolus 1,000 mL (0 mLs Intravenous Stopped 11/04/20 1142)    ED Course  I have reviewed the triage vital signs and the nursing notes.  Pertinent labs & imaging results that were available during my care of the patient were reviewed by me and considered in my medical decision making (see chart for details).    MDM Rules/Calculators/A&P                         1 syncope patient has been normotensive here however heart rate has been bradycardic with most heart rates at 45 on the monitor even while awake and talking.  Subsequently, cardiology has been consulted.  2 patient with generalized weakness and complaint of left leg weakness.  Here in the ED her neuro exam has been normal.  Iris had an MRI obtained.  Some punctate lesions were noted and this was discussed with Dr. Leonel Ramsay, on-call for neurology.  He feels that these are normal variants.  This information was  relayed to the patient.  Final Clinical Impression(s) / ED Diagnoses Final diagnoses:  Near syncope    Rx / DC Orders ED Discharge Orders    None       Pattricia Boss, MD 11/04/20 [phone]

## 2020-11-04 NOTE — ED Triage Notes (Signed)
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 C/o worse L leg pain.

## 2020-11-08 ENCOUNTER — Ambulatory Visit: Payer: No Typology Code available for payment source | Admitting: Physical Therapy

## 2020-11-08 ENCOUNTER — Telehealth: Payer: Self-pay | Admitting: Physical Therapy

## 2020-11-08 NOTE — Telephone Encounter (Signed)
Contacted patient regarding no show to her appointment today. She stated she had a lot going on a forgot. She plans to attend her future appointments.

## 2020-11-09 ENCOUNTER — Encounter: Payer: Self-pay | Admitting: Family Medicine

## 2020-11-09 ENCOUNTER — Ambulatory Visit (INDEPENDENT_AMBULATORY_CARE_PROVIDER_SITE_OTHER): Payer: No Typology Code available for payment source | Admitting: Family Medicine

## 2020-11-09 ENCOUNTER — Other Ambulatory Visit: Payer: Self-pay

## 2020-11-09 VITALS — BP 98/60 | HR 66 | Wt 106.6 lb

## 2020-11-09 DIAGNOSIS — E0590 Thyrotoxicosis, unspecified without thyrotoxic crisis or storm: Secondary | ICD-10-CM

## 2020-11-09 DIAGNOSIS — L309 Dermatitis, unspecified: Secondary | ICD-10-CM | POA: Diagnosis not present

## 2020-11-09 DIAGNOSIS — L209 Atopic dermatitis, unspecified: Secondary | ICD-10-CM | POA: Diagnosis not present

## 2020-11-09 DIAGNOSIS — R319 Hematuria, unspecified: Secondary | ICD-10-CM | POA: Diagnosis not present

## 2020-11-09 DIAGNOSIS — R3129 Other microscopic hematuria: Secondary | ICD-10-CM | POA: Diagnosis not present

## 2020-11-09 DIAGNOSIS — R55 Syncope and collapse: Secondary | ICD-10-CM | POA: Diagnosis not present

## 2020-11-09 LAB — POCT URINALYSIS DIP (MANUAL ENTRY)
Bilirubin, UA: NEGATIVE
Glucose, UA: NEGATIVE mg/dL
Leukocytes, UA: NEGATIVE
Nitrite, UA: NEGATIVE
Protein Ur, POC: NEGATIVE mg/dL
Spec Grav, UA: >=1.03 — AB (ref 1.010–1.025)
Urobilinogen, UA: 0.2 E.U./dL
pH, UA: 5.5 (ref 5.0–8.0)

## 2020-11-09 LAB — POCT UA - MICROSCOPIC ONLY

## 2020-11-09 NOTE — Progress Notes (Signed)
SUBJECTIVE:   CHIEF COMPLAINT / HPI: Follow up ED/presyncope    Caroline Jensen a 35 year old female presenting for follow-up after ED visit due to presyncope.  She was seen on 3/3 after feeling generally weak and lightheaded around 12:30 AM while at work on [employer].  While she was in the ED it was noted that she had persistent sinus bradycardia, cardiology was consulted and recommended no further evaluation.  Additionally due to concern for more left-sided weakness, brain MRI obtained showing 2 nonspecific punctate foci within the left cerebral white matter.  Neurology reviewed images while in the ED and felt this was likely normal variant. Beta-hCG negative.  CBC with normal hemoglobin, BMP unremarkable.  Today she reports she is feeling better, however has been having some throbbing frontal headaches almost every day since the occurrence.  After the episode on 3/3 she did feel a little dizzy and generally weak on Saturday, but this resolved.  With her headache she denies any blurred vision, focal weakness, numbness/tingling, lightheadedness/dizziness.  She has not used much to make it better, hesitant to use medications if not needed.  She rarely drinks water, has had 1 can of ginger ale today.  Sleeps about 5-6 hours nightly.  She also reports worsening eczema on her chest not improving with hydrocortisone and a few days of triamcinolone.  Lastly mentions she would like to gain weight and wants to know there is any medications that can be prescribed for this.  She previously has done Ensure/boost with every meal and did not have much success with that.  PERTINENT  PMH / PSH: Subclinical hyperthyroidism, moderate/severe atopic dermatitis, right shoulder impingement, history of palpitations 2019 with Holter monitor showing PVCs  OBJECTIVE:   BP 98/60   Pulse 66   Wt 106 lb 9.6 oz (48.4 kg)   SpO2 98%   BMI 19.50 kg/m   General: Alert, NAD HEENT: NCAT Cardiac: RRR no m/g/r Lungs: Clear bilaterally,  no increased WOB on RA   Msk: Moves all extremities spontaneously  Ext: Warm, dry, 2+ distal pulses, no edema b/l  Derm: Dry erythematous patch present over her right breast/chest with excoriations. Neuro: Alert and oriented.  EOMI, PERRLA.  Speech easily understandable.  CN II-XII intact.  5/5 upper and lower extremity strength bilaterally.  Sensation to light touch intact throughout.  Rapid hand movement normal.  2/4 patellar and biceps reflexes.  Balance gait on toes and heels.  ASSESSMENT/PLAN:   Pre-syncope Evaluated in the ED on 3/3.  Reassuring work-up thus far, including no current evidence of arrhythmia, anemia, renal/electrolyte dysfunction, infection, or current pregnancy.  Sinus bradycardia in ED evaluated by cardiology and felt that this was not contributing, HR in 60s today.  Through her clinical history, sound suspicious for likely vasovagal episode and likely contributing dehydration.  Given her history of thyroid dysfunction, will check TSH.  Otherwise, provided reassurance and continued observation, recommended significantly increasing daily water intake and following balanced diet.  Hematuria, microscopic Present on U/a in the ED.  Not currently on her menstrual cycle.  Recheck today with moderate hemoglobin and 1-5 RBCs per hpf.  No abdominal or UTI symptoms to suggest underlying UTI or renal stone, however will send urine for culture.  Creatinine WNL on BMP 3/3.  Likely will recheck on follow-up visit after increasing water intake, may proceed with renal U/S.  Atopic dermatitis Current flare on chest.  Discussed daily emollient.  Rx'd triamcinolone 0.05% to use for the next 1-2 weeks, follow-up if not  improving.   Desire for weight gain Chronically around 105-110 pounds.  Previously tried protein shakes with meals however ineffective.  Discussed trial of protein shakes in between meals instead and increasing calorie dense foods within her diet, do not recommend any medications  currently solely for weight gain.  Recommended follow-up in approximately 2 weeks or sooner if needed.  [name], [place]

## 2020-11-09 NOTE — Patient Instructions (Signed)
It was wonderful to see you!   We will check your thyroid and urine again today.   Please make sure you drink at least 3 bottles (16oz) of water daily. You will urinate more than usual with this initially, can work up to this slowly if needed.

## 2020-11-10 ENCOUNTER — Ambulatory Visit: Payer: No Typology Code available for payment source | Admitting: Physical Therapy

## 2020-11-10 ENCOUNTER — Encounter: Payer: Self-pay | Admitting: Family Medicine

## 2020-11-10 DIAGNOSIS — M25512 Pain in left shoulder: Secondary | ICD-10-CM | POA: Diagnosis not present

## 2020-11-10 DIAGNOSIS — M357 Hypermobility syndrome: Secondary | ICD-10-CM | POA: Diagnosis not present

## 2020-11-10 DIAGNOSIS — G8929 Other chronic pain: Secondary | ICD-10-CM | POA: Diagnosis not present

## 2020-11-10 DIAGNOSIS — M6281 Muscle weakness (generalized): Secondary | ICD-10-CM | POA: Diagnosis not present

## 2020-11-10 DIAGNOSIS — M25511 Pain in right shoulder: Secondary | ICD-10-CM | POA: Diagnosis not present

## 2020-11-10 DIAGNOSIS — R3129 Other microscopic hematuria: Secondary | ICD-10-CM | POA: Insufficient documentation

## 2020-11-10 DIAGNOSIS — R55 Syncope and collapse: Secondary | ICD-10-CM | POA: Insufficient documentation

## 2020-11-10 LAB — TSH: TSH: 0.348 u[IU]/mL — ABNORMAL LOW (ref 0.450–4.500)

## 2020-11-10 MED ORDER — TRIAMCINOLONE ACETONIDE 0.5 % EX OINT: 1.0000 "application " | TOPICAL_OINTMENT | Freq: Two times a day (BID) | CUTANEOUS | 3 refills | Status: DC

## 2020-11-10 NOTE — Therapy (Signed)
[place], [place], [address] Phone: [phone]   Fax:  [phone]  Physical Therapy Treatment  Patient Details  Name: Caroline Jensen MRN: [id number] Date of Birth: 10-03-1985 Referring Provider (PT): Darrelyn Hillock, DO   Encounter Date: 11/10/2020   PT End of Session - 11/10/20 1625    Visit Number 7    Number of Visits 13    Date for PT Re-Evaluation 11/15/20    Authorization Type Focus    PT Start Time 1625   late check in   PT Stop Time 1705    PT Time Calculation (min) 40 min    Activity Tolerance Patient tolerated treatment well    Behavior During Therapy [facility] for tasks assessed/performed           Past Medical History:  Diagnosis Date  . Atopic dermatitis 09/25/2020  . Breast skin changes 12/12/2016  . Fatigue 02/01/2017  . Genital warts   . Low thyroid stimulating hormone (TSH) level 02/01/2017  . Shoulder pain, bilateral 01/06/2015    Past Surgical History:  Procedure Laterality Date  . LASER ABLATION OF CONDYLOMAS      There were no vitals filed for this visit.   Subjective Assessment - 11/10/20 1649    Subjective No pain, explains an episode at work when she felt faint, was dehydrated.    Currently in Pain? No/denies             [facility] Adult PT Treatment/Exercise - 11/10/20 0001      Elbow Exercises   Elbow Flexion Strengthening   biceps green band     Shoulder Exercises: Supine   Horizontal ABduction 20 reps    Theraband Level (Shoulder Horizontal ABduction) Level 3 (Green);Level 2 (Red)    External Rotation 20 reps    Theraband Level (Shoulder External Rotation) Level 3 (Green)    Diagonals Strengthening;Both;10 reps    Theraband Level (Shoulder Diagonals) Level 2 (Red)      Shoulder Exercises: ROM/Strengthening   UBE (Upper Arm Bike) 4 min in reverse L 1    Modified Plank Limitations rocks x 10 table hgt , then static hold 10 sec x 3    Side Plank Limitations modified  10 sec on high table x 3    Other ROM/Strengthening Exercises bicep curls green band x 20 , triceps x 10    Other ROM/Strengthening Exercises bird dog x 8 each side                  PT Education - 11/10/20 1650    Education Details more advanced HEP    Person(s) Educated Patient    Methods Explanation    Comprehension Verbalized understanding               PT Long Term Goals - 11/10/20 1701      PT LONG TERM GOAL #1   Title Pt will be I with HEP for bilateral shoulder stability, strength    Status On-going      PT LONG TERM GOAL #2   Title Pt will be more aware and mindful of joint positions, posture related to ADLs and work    Baseline varies    Status On-going      PT LONG TERM GOAL #3   Title Pt will demo no pain with overhead and cross body reaching(>120 deg) for home tasks, dressing, work    Baseline soreness end range    Status On-going  PT LONG TERM GOAL #4   Title Pt will report pain at work no more than 3/10 with normal work duties (pulling , lifting)    Baseline improvement, not lifting much lately    Status On-going                 Plan - 11/10/20 1631    Clinical Impression Statement Patient with improvements in pain and function.  Weakness and discomfort in weightbearing positions.  Upgraded HEP for more stabilization.  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    PT Treatment/Interventions ADLs/Self Care Home Management;Cryotherapy;Therapeutic exercise;Patient/family education;Taping;Functional mobility training;Iontophoresis 4mg /ml Dexamethasone;Moist Heat;Ultrasound;Passive range of motion;Neuromuscular re-education;Therapeutic activities;Manual techniques;Electrical Stimulation    PT Next Visit Plan add stability for scapula (closed chain) , modalities as needed. consider tape to shoulder    PT Home Exercise Plan [id number]: retract, ER, standing wall push up, plus, Quadruped bird dog and mod plank    Consulted  and Agree with Plan of Care Patient           Patient will benefit from skilled therapeutic intervention in order to improve the following deficits and impairments:  Decreased mobility,Pain,Impaired UE functional use,Increased fascial restricitons,Decreased strength,Hypermobility  Visit Diagnosis: Chronic right shoulder pain  Chronic left shoulder pain  Hypermobility syndrome  Muscle weakness (generalized)     Problem List Patient Active Problem List   Diagnosis Date Noted  . Pre-syncope 11/10/2020  . Hematuria, microscopic 11/10/2020  . Cough in adult 09/25/2020  . Atopic dermatitis 09/25/2020  . Impingement syndrome of right shoulder 09/06/2020  . Intramural uterine fibroid 02/26/2020  . Urge incontinence 11/11/2019  . Palpitations 07/18/2017  . Hyperthyroidism 12/12/2016  . Allergic rhinitis 01/06/2015    Caroline Jensen 11/10/2020, 5:14 PM  [facility] [address] [place], [place], [address] Phone: [phone]   Fax:  [phone]  Name: Caroline Jensen MRN: [id number] Date of Birth: Sep 08, 1985  Raeford Razor, PT 11/10/20 5:14 PM Phone: [phone] Fax: [phone]

## 2020-11-10 NOTE — Assessment & Plan Note (Signed)
Present on U/a in the ED.  Not currently on her menstrual cycle.  Recheck today with moderate hemoglobin and 1-5 RBCs per hpf.  No abdominal or UTI symptoms to suggest underlying UTI or renal stone, however will send urine for culture.  Creatinine WNL on BMP 3/3.  Likely will recheck on follow-up visit after increasing water intake, may proceed with renal U/S.

## 2020-11-10 NOTE — Assessment & Plan Note (Signed)
Current flare on chest.  Discussed daily emollient.  Rx'd triamcinolone 0.05% to use for the next 1-2 weeks, follow-up if not improving.

## 2020-11-10 NOTE — Assessment & Plan Note (Addendum)
Evaluated in the ED on 3/3.  Reassuring work-up thus far, including no current evidence of arrhythmia, anemia, renal/electrolyte dysfunction, infection, or current pregnancy.  Sinus bradycardia in ED evaluated by cardiology and felt that this was not contributing, HR in 60s today.  Through her clinical history, sound suspicious for likely vasovagal episode and likely contributing dehydration.  Given her history of thyroid dysfunction, will check TSH.  Otherwise, provided reassurance and continued observation, recommended significantly increasing daily water intake and following balanced diet.

## 2020-11-11 LAB — URINE CULTURE

## 2020-11-12 LAB — SPECIMEN STATUS REPORT

## 2020-11-12 LAB — T4, FREE: Free T4: 1.26 ng/dL (ref 0.82–1.77)

## 2020-11-15 ENCOUNTER — Ambulatory Visit: Payer: No Typology Code available for payment source | Admitting: Physical Therapy

## 2020-11-15 ENCOUNTER — Other Ambulatory Visit: Payer: Self-pay

## 2020-11-15 ENCOUNTER — Encounter: Payer: Self-pay | Admitting: Physical Therapy

## 2020-11-15 DIAGNOSIS — M25511 Pain in right shoulder: Secondary | ICD-10-CM | POA: Diagnosis not present

## 2020-11-15 DIAGNOSIS — M25512 Pain in left shoulder: Secondary | ICD-10-CM | POA: Diagnosis not present

## 2020-11-15 DIAGNOSIS — M357 Hypermobility syndrome: Secondary | ICD-10-CM | POA: Diagnosis not present

## 2020-11-15 DIAGNOSIS — G8929 Other chronic pain: Secondary | ICD-10-CM

## 2020-11-15 DIAGNOSIS — M6281 Muscle weakness (generalized): Secondary | ICD-10-CM | POA: Diagnosis not present

## 2020-11-15 NOTE — Therapy (Signed)
[place], [place], [address] Phone: [phone]   Fax:  [phone]  Physical Therapy Treatment  Patient Details  Name: Caroline Jensen MRN: [id number] Date of Birth: 07/02/1986 Referring Provider (PT): Darrelyn Hillock, DO   Encounter Date: 11/15/2020   PT End of Session - 11/15/20 1108    Visit Number 8    Number of Visits 13    Date for PT Re-Evaluation 11/15/20    Authorization Type Focus    PT Start Time 1102    PT Stop Time 1140    PT Time Calculation (min) 38 min           Past Medical History:  Diagnosis Date  . Atopic dermatitis 09/25/2020  . Breast skin changes 12/12/2016  . Fatigue 02/01/2017  . Genital warts   . Low thyroid stimulating hormone (TSH) level 02/01/2017  . Shoulder pain, bilateral 01/06/2015    Past Surgical History:  Procedure Laterality Date  . LASER ABLATION OF CONDYLOMAS      There were no vitals filed for this visit.   Subjective Assessment - 11/15/20 1108    Subjective Pt reports she is not experiencing shoulder pain as often. She did have pain with cleaning ashes out of the fireplace this weekend.    Currently in Pain? No/denies                             [facility] Adult PT Treatment/Exercise - 11/15/20 0001      Shoulder Exercises: Supine   Protraction 20 reps;Both    Protraction Weight (lbs) 2    Horizontal ABduction 20 reps    Theraband Level (Shoulder Horizontal ABduction) Level 3 (Green);Level 2 (Red)    External Rotation 20 reps    Theraband Level (Shoulder External Rotation) Level 3 (Green)    Diagonals Strengthening;Both;10 reps    Theraband Level (Shoulder Diagonals) Level 2 (Red)      Shoulder Exercises: Prone   Other Prone Exercises Bird dogs 10 sec x 10 each      Shoulder Exercises: Sidelying   External Rotation --    External Rotation Weight (lbs) --      Shoulder Exercises: Standing   Internal Rotation 15 reps;Right;Left     Theraband Level (Shoulder Internal Rotation) Level 3 (Green)    Extension 20 reps    Theraband Level (Shoulder Extension) Level 3 (Green)    Row 20 reps    Theraband Level (Shoulder Row) Level 3 (Green)    Other Standing Exercises counter push up -tricep x 20    Other Standing Exercises green band tricep pull x 20 each, 3# bicep curls x 20 bilat      Shoulder Exercises: ROM/Strengthening   UBE (Upper Arm Bike) L1.5 3 minutes each    Modified Plank Limitations modified on high table 20 sec x 2    Side Plank Limitations modified 20 sec on high table x 2 each                       PT Long Term Goals - 11/10/20 1701      PT LONG TERM GOAL #1   Title Pt will be I with HEP for bilateral shoulder stability, strength    Status On-going      PT LONG TERM GOAL #2   Title Pt will be more aware and mindful of joint positions, posture related to ADLs and  work    Baseline varies    Status On-going      PT LONG TERM GOAL #3   Title Pt will demo no pain with overhead and cross body reaching(>120 deg) for home tasks, dressing, work    Baseline soreness end range    Status On-going      PT LONG TERM GOAL #4   Title Pt will report pain at work no more than 3/10 with normal work duties (pulling , lifting)    Baseline improvement, not lifting much lately    Status On-going                 Plan - 11/15/20 1143    Clinical Impression Statement Pt reports overall pain is less frequent. She tolerated session well with increased resistance and increased closed chain UE stabilization. She reported no increased pain during session.    PT Next Visit Plan add stability for scapula (closed chain) , modalities as needed. consider tape to shoulder    PT Home Exercise Plan [id number]: retract, ER, standing wall push up, plus, Quadruped bird dog and mod plank           Patient will benefit from skilled therapeutic intervention in order to improve the following deficits and impairments:   Decreased mobility,Pain,Impaired UE functional use,Increased fascial restricitons,Decreased strength,Hypermobility  Visit Diagnosis: Chronic left shoulder pain  Hypermobility syndrome  Muscle weakness (generalized)  Chronic right shoulder pain     Problem List Patient Active Problem List   Diagnosis Date Noted  . Pre-syncope 11/10/2020  . Hematuria, microscopic 11/10/2020  . Cough in adult 09/25/2020  . Atopic dermatitis 09/25/2020  . Impingement syndrome of right shoulder 09/06/2020  . Intramural uterine fibroid 02/26/2020  . Urge incontinence 11/11/2019  . Palpitations 07/18/2017  . Hyperthyroidism 12/12/2016  . Allergic rhinitis 01/06/2015    Dorene Ar, PTA 11/15/2020, 11:44 AM  [facility] [address] [place], [place], [address] Phone: [phone]   Fax:  [phone]  Name: Caroline Jensen MRN: [id number] Date of Birth: [birth year]

## 2020-11-16 ENCOUNTER — Telehealth: Payer: Self-pay

## 2020-11-16 ENCOUNTER — Ambulatory Visit (HOSPITAL_COMMUNITY)
Admission: EM | Admit: 2020-11-16 | Discharge: 2020-11-16 | Disposition: A | Discharge status: Home / Self Care | Payer: No Typology Code available for payment source | Attending: Student | Admitting: Student

## 2020-11-16 ENCOUNTER — Encounter (HOSPITAL_COMMUNITY): Payer: Self-pay | Admitting: *Deleted

## 2020-11-16 ENCOUNTER — Other Ambulatory Visit: Payer: Self-pay

## 2020-11-16 DIAGNOSIS — H538 Other visual disturbances: Secondary | ICD-10-CM | POA: Diagnosis not present

## 2020-11-16 DIAGNOSIS — R42 Dizziness and giddiness: Secondary | ICD-10-CM | POA: Diagnosis not present

## 2020-11-16 DIAGNOSIS — R031 Nonspecific low blood-pressure reading: Secondary | ICD-10-CM

## 2020-11-16 DIAGNOSIS — E860 Dehydration: Secondary | ICD-10-CM

## 2020-11-16 LAB — POCT URINALYSIS DIPSTICK, ED / UC
Bilirubin Urine: NEGATIVE
Glucose, UA: NEGATIVE mg/dL
Ketones, ur: NEGATIVE mg/dL
Nitrite: NEGATIVE
Protein, ur: NEGATIVE mg/dL
Specific Gravity, Urine: 1.02 (ref 1.005–1.030)
Urobilinogen, UA: 0.2 mg/dL (ref 0.0–1.0)
pH: 6 (ref 5.0–8.0)

## 2020-11-16 LAB — CBG MONITORING, ED: Glucose-Capillary: 77 mg/dL (ref 70–99)

## 2020-11-16 MED ORDER — SODIUM CHLORIDE 0.9 % IV SOLN
Freq: Once | INTRAVENOUS | Status: AC
  Administered 2020-11-16: 1000 mL via INTRAVENOUS

## 2020-11-16 NOTE — Discharge Instructions (Addendum)
If any of your symptoms worsen go immediately to the nearest emergency department for further work-up and evaluation.  I provided you with a work note recommend rest hydrate well with fluids and increase protein into your diet over the next 24 hours.  I have placed urgent referral for you to be evaluated by neurology and cardiology for further work-up and evaluation of your symptoms.

## 2020-11-16 NOTE — ED Provider Notes (Addendum)
[facility]    CSN: [id number] Arrival date & time: 11/16/20  1238      History   Chief Complaint Chief Complaint  Patient presents with  . Dizziness    light  . Light headed  . Blurred Vision    HPI Caroline Caroline is a 35 y.o. female.   HPI  Patient presents today with persistent dizziness and sensation of feeling that she was about to pass out while at work today. This episode happened around 11 AM today.  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 She did not pass out. She was able to ambulate to the a chair. She reports experiencing ocular disturbances involving changes in vision and eye heaviness. She had a similar episode on March 3rd and was seen in the ER and work up including both cardiology consult and MRI brain. MRI 11/04/20 revealed "Two nonspecific punctate foci of T2/FLAIR hyperintensity within the left cerebral white matter, one within the left frontal lobe and one within the left temporal-occipital lobes". Neurology was consulted while patient was in the ER and thought that MRI findings were consistent with a normal variant. Patient subsequently follow-up with primary care provider and had labs which was significant for chronic low TSH and dehydration. She was not referred to specialty for any further work   Past Medical History:  Diagnosis Date  . Atopic dermatitis 09/25/2020  . Breast skin changes 12/12/2016  . Fatigue 02/01/2017  . Genital warts   . Low thyroid stimulating hormone (TSH) level 02/01/2017  . Shoulder pain, bilateral 01/06/2015    Patient Active Problem List   Diagnosis Date Noted  . Pre-syncope 11/10/2020  . Hematuria, microscopic 11/10/2020  . Cough in adult 09/25/2020  . Atopic dermatitis 09/25/2020  . Impingement syndrome of right shoulder 09/06/2020  .  Intramural uterine fibroid 02/26/2020  . Urge incontinence 11/11/2019  . Palpitations 07/18/2017  . Hyperthyroidism 12/12/2016  . Allergic rhinitis 01/06/2015    Past Surgical History:  Procedure Laterality Date  . LASER ABLATION OF CONDYLOMAS      OB History    Gravida  3   Para  3   Term  2   Preterm  1   AB      Living  3     SAB      IAB      Ectopic      Multiple      Live Births  3            Home Medications    Prior to Admission medications   Medication Sig Start Date End Date Taking? Authorizing Provider  meloxicam (MOBIC) 15 MG tablet Take 1 tablet (15 mg total) by mouth daily. Patient not taking: Reported on 11/04/2020 10/19/20   Dene Gentry, MD  triamcinolone ointment (KENALOG) 0.5 % Apply 1 application topically 2 (two) times daily. For moderate to severe eczema.  Do not use for more than 1 week at a time. 11/10/20   [name], DO    Family History Family History  Problem Relation Age of Onset  . Drug abuse Father   . Cancer Paternal Aunt        leukemia  . Hypertension Maternal Aunt   . Stroke Other  unknown  . Cirrhosis Maternal Grandmother   . Urticaria Brother   . Eczema Son   . Diabetes Neg Hx   . Heart disease Neg Hx   . Allergic rhinitis Neg Hx   . Asthma Neg Hx     Social History Social History   Tobacco Use  . Smoking status: Never Smoker  . Smokeless tobacco: Never Used  Vaping Use  . Vaping Use: Never used  Substance Use Topics  . Alcohol use: No    Alcohol/week: 0.0 standard drinks  . Drug use: No     Allergies   Patient has no known allergies.   Review of Systems Review of Systems Pertinent negatives listed in HPI  Physical Exam Triage Vital Signs ED Triage Vitals [11/16/20 1255]  Enc Vitals Group     BP 101/69     Pulse Rate (!) 53     Resp 18     Temp 98 F (36.7 C)     Temp Source Oral     SpO2 100 %     Weight      Height      Head Circumference      Peak Flow       Pain Score      Pain Loc      Pain Edu?      Excl. in [place]?    No data found.  Updated Vital Signs BP 101/69 (BP Location: Right Arm)   Pulse (!) 53   Temp 98 F (36.7 C) (Oral)   Resp 18   LMP 10/30/2020   SpO2 100%   Visual Acuity Right Eye Distance:   Left Eye Distance:   Bilateral Distance:    Right Eye Near:   Left Eye Near:    Bilateral Near:     Physical Exam Constitutional:      Appearance: She is underweight. She is ill-appearing.  Eyes:     General: Lids are normal.     Extraocular Movements: Extraocular movements intact.     Right eye: Normal extraocular motion.     Left eye: Normal extraocular motion.  Cardiovascular:     Rate and Rhythm: Regular rhythm. Bradycardia present.     Pulses:          Radial pulses are 2+ on the right side and 2+ on the left side.     Heart sounds: Normal heart sounds.  Pulmonary:     Effort: Pulmonary effort is normal.     Breath sounds: Normal breath sounds and air entry.  Musculoskeletal:     Right lower leg: No edema.     Left lower leg: No edema.  Skin:    General: Skin is warm.     Capillary Refill: Capillary refill takes less than 2 seconds.  Neurological:     Mental Status: She is alert and oriented to person, place, and time.     GCS: GCS eye subscore is 4. GCS verbal subscore is 5. GCS motor subscore is 6.     Cranial Nerves: Cranial nerves are intact.     Sensory: Sensation is intact.     Motor: Motor function is intact.     Coordination: Coordination is intact.     Gait: Gait is intact.  Psychiatric:        Attention and Perception: Attention normal.        Mood and Affect: Mood normal.        Speech: Speech normal.  Behavior: Behavior is cooperative.     UC Treatments / Results  Labs (all labs ordered are listed, but only abnormal results are displayed) Labs Reviewed  POCT URINALYSIS DIPSTICK, ED / UC - Abnormal; Notable for the following components:      Result Value   Hgb urine dipstick  SMALL (*)    Leukocytes,Ua TRACE (*)    All other components within normal limits  CBG MONITORING, ED    EKG Sinus Bradycardia, rate 54, no ST changes present   Radiology No results found.  Procedures Procedures (including critical care time)  Medications Ordered in UC Medications  0.9 %  sodium chloride infusion (1,000 mLs Intravenous New Bag/Given 11/16/20 1422)    Initial Impression / Assessment and Plan / UC Course  I have reviewed the triage vital signs and the nursing notes.  Pertinent labs & imaging results that were available during my care of the patient were reviewed by me and considered in my medical decision making (see chart for details).   Patient presents today with acute onset dizziness UA revealed specific gravity elevated 1.020 indicating persistent dehydration.  Patient was seen her primary care office was also noted to the dehydrated.  Blood pressure is also soft at 101/69.  Patient bolused with a liter of normal saline. Given patient has had multiple episodes of dizziness and a near syncopal episode in early portion of March patient warrants further evaluation by specialty placing referrals for both neurology and cardiology given patient also has experienced recent bradycardia. Patient continues to experience intermittent dizziness however, reports some improvement with IV hydration. She was able tolerate snack and soda by mouth. VSS reassuring. Neurological exam intact. Patient advised ER evaluation warranted if symptoms worsen or persist.  Final Clinical Impressions(s) / UC Diagnoses   Final diagnoses:  Dizziness  Dehydration  Low blood pressure reading   Discharge Instructions   None    ED Prescriptions    None     PDMP not reviewed this encounter.   Scot Jun, FNP 11/16/20 [place], [place], [place] 11/16/20 [phone]

## 2020-11-16 NOTE — Telephone Encounter (Signed)
Patient calls nurse line reporting another episode of presyncope. Patient reports she became very dizzy and genuinely does not feel well. Patient reports a headache, however no nausea or vomiting. Patient seemed SOB while talking to her. Patient advised to call 911 or have someone drive her to the ED if someone was with her. Patient agreed with plan.

## 2020-11-16 NOTE — ED Triage Notes (Signed)
PT reports today at  Work she felt dizzy, lightheaded,vision was blurred. Pt;\'s C- workers placed Pt on to floor . Pt was brought to UC by mother.

## 2020-11-17 ENCOUNTER — Ambulatory Visit (INDEPENDENT_AMBULATORY_CARE_PROVIDER_SITE_OTHER): Payer: No Typology Code available for payment source | Admitting: Family Medicine

## 2020-11-17 ENCOUNTER — Other Ambulatory Visit: Payer: Self-pay

## 2020-11-17 ENCOUNTER — Ambulatory Visit: Payer: No Typology Code available for payment source | Admitting: Physical Therapy

## 2020-11-17 ENCOUNTER — Telehealth: Payer: Self-pay

## 2020-11-17 VITALS — BP 97/65 | HR 67 | Ht 62.0 in | Wt 109.5 lb

## 2020-11-17 DIAGNOSIS — R42 Dizziness and giddiness: Secondary | ICD-10-CM

## 2020-11-17 DIAGNOSIS — G8929 Other chronic pain: Secondary | ICD-10-CM | POA: Insufficient documentation

## 2020-11-17 DIAGNOSIS — R55 Syncope and collapse: Secondary | ICD-10-CM | POA: Diagnosis not present

## 2020-11-17 DIAGNOSIS — R519 Headache, unspecified: Secondary | ICD-10-CM

## 2020-11-17 MED ORDER — KETOROLAC TROMETHAMINE 15 MG/ML IJ SOLN: 15.0000 mg | Freq: Once | INTRAMUSCULAR | Status: AC

## 2020-11-17 MED ORDER — MECLIZINE HCL 25 MG PO TABS: 12.5000 mg | ORAL_TABLET | Freq: Three times a day (TID) | ORAL | 0 refills | Status: DC | PRN

## 2020-11-17 NOTE — Telephone Encounter (Signed)
Patient calls nurse line regarding persistent dizziness and headache. Patient was evaluated in UC yesterday for same concern. However, patient reports that she is continuing to have these symptoms, is concerned and would like to be re evaluated. This has been an on-going problem since 11/04/20. Patient reports that she is supposed to return to work tomorrow but is concerned as she continues to have dizzy episodes with low BP and HR.   Scheduled patient same day appointment in [facility] this afternoon. Strict ED precautions given in the meantime.   Talbot Grumbling, RN

## 2020-11-17 NOTE — Assessment & Plan Note (Addendum)
Acute, persistent. Unclear if pre-syncope vs dizziness. Appears symptoms are more dizziness today with overlying unilateral headache concerning for migraine. Unclear etiology. Extensive work up thus far including MRI, EKG, CBC, CMP, UA. TSH/T4 consistent with subclinical hyperthyroidism. [place] pike negative. No other symptoms to suggest Meniere disease. No red flags on exam today to indicate need for repeat imaging. Orthostatics negative although heart rate drops upon sitting with development of dizziness. Has chronic history of bradycardia with prior work up in 2019 including holter monitor which was normal. She has cardiology appointment scheduled for Tuesday. Unclear if migraine headache is contributing to dizziness or dizziness is contributing to headache. Provided symptomatic treatment today with encouragement to follow up with cardiology and neurology as scheduled. - Toradol 15mg  IM x 1 - Rx for Meclezine 12.5-25mg  TID PRN - recommended  - encouraged to drink 2L of water a day - patient to file for FMLA to allow her to leave work as needed for symptoms and to allow her to follow up for appointments for evaluation  - strict return precautions discussed

## 2020-11-17 NOTE — Assessment & Plan Note (Signed)
Acute. Appears migrainous in nature. No prior history of migraines. Recommended adequate hydration, Excedrin, and rest.  - Toradol 15mg  IM x 1 for acute relief

## 2020-11-17 NOTE — Progress Notes (Signed)
Subjective:   Patient ID: Caroline Jensen    DOB: 01-Aug-1986, 35 y.o. female   MRN: [id number]  Zianne Schubring is a 35 y.o. female with a history of allergic rhinitis, subclinical hyperthyroidism, atopic dermatitis, microscopic hematuria, uterine fibroids, palpitations, urge incontinence here for persistent dizziness.  Dizziness/Lightheadedness: Patient presents today for continued evaluation of dizziness. She describes the dizziness and lightheadedness as intermittent. She has ocular disturbances as well with changes in vision and eye heaviness. She'll get weakness with the episodes as well. Denies any falls or head trauma. She was seen in ED on 3/3 and noted to have bradycardia. She has a history of palpitations with evaluation with loop recorder. MRI was obtained which noted "Two nonspecific punctate foci of T2/FLAIR hyperintensity within the left cerebral white matter, one within the left frontal lobe and one within the left temporal-occipital lobes". Neurology was consulted while patient was in the ER and thought that MRI findings were consistent with a normal variant. Cardiology was also consulted and recommended to evaluation. CBC, BMP, and beta hcg normal. She was seen by PCP on 3/8 and complained of headaches. She was noted to have poor water intake and though dizziness was associated with vasovagal. TSH was evaluated and noted to be chronically low. She was seen in the ED again on 3/15 for persistent symptoms. She was noted to have signs of dehydration and low blood pressure. She was given IV hydration and referred to cardiology and neurology.   Today she complains of persistent dizziness that she describes as the room spinning.  She denies any hearing changes or tinnitus.  She is also had this left-sided retro-orbital headache and photophobia x1 day.  She notes she has only only drank some cranberry juice and orange juice today.  She notes the symptoms are intermittent however this is affecting her  ability to work.  She is afraid she is going lose her job due to the random symptoms that occur and the need to miss work for appointments for further evaluation of this.  Review of Systems:  Per HPI.   Objective:   BP 97/65   Pulse 67   Ht 5\' 2"  (1.575 m)   Wt 109 lb 8 oz (49.7 kg)   LMP 10/30/2020   SpO2 99%   BMI 20.03 kg/m  Vitals and nursing note reviewed.  General: pleasant thin young female, appears uncomfortable while sitting in exam chair, often has head lying in her hands, well nourished, well developed, in no acute distress with non-toxic appearance HEENT: normocephalic, atraumatic, TM normal bilaterally  CV: regular rate and rhythm without murmurs, rubs, or gallops Lungs: clear to auscultation bilaterally with normal work of breathing on room air, speaking in full sentences Skin: warm, dry LXB:WIOM normal Neuro: Alert and oriented, speech normal, [place] pike negative bilaterally  Orthostatics 11/17/20: Lying: O2: 99%, HR 89 BPM, BP 96/59 Sitting: O2: 99%, HR 65 BPM, BP 99/68 Standing: O2: 99%, HR 71bpm, BP 110/70  Prior labs/Imaging: EKG 11/16/20: sinus bradycardia with HR 54 EKG 11/04/20: sinus bradycardia with HR 46  BMP Latest Ref Rng & Units 11/04/2020 02/21/2020 07/10/2017  Glucose 70 - 99 mg/dL 83 43(LL) 87  BUN 6 - 20 mg/dL 8 8 6   Creatinine 0.44 - 1.00 mg/dL 0.95 0.77 0.71  BUN/Creat Ratio 9 - 23 - - -  Sodium 135 - 145 mmol/L 137 135 137  Potassium 3.5 - 5.1 mmol/L 3.7 3.5 3.3(L)  Chloride 98 - 111 mmol/L 106 102 108  CO2  22 - 32 mmol/L 22 24 24   Calcium 8.9 - 10.3 mg/dL 9.0 9.1 8.9   CBC Latest Ref Rng & Units 11/04/2020 02/21/2020 07/10/2017  WBC 4.0 - 10.5 K/uL 4.2 6.8 4.7  Hemoglobin 12.0 - 15.0 g/dL 12.0 14.7 11.3(L)  Hematocrit 36.0 - 46.0 % 34.2(L) 43.8 33.8(L)  Platelets 150 - 400 K/uL 216 247 201   B-HCG: negative  Urinalysis    Component Value Date/Time   COLORURINE STRAW (A) 11/04/2020 0509   APPEARANCEUR CLEAR 11/04/2020 0509   LABSPEC  1.020 11/16/2020 1330   PHURINE 6.0 11/16/2020 1330   GLUCOSEU NEGATIVE 11/16/2020 1330   HGBUR SMALL (A) 11/16/2020 1330   BILIRUBINUR NEGATIVE 11/16/2020 1330   BILIRUBINUR negative 11/09/2020 1655   BILIRUBINUR NEG 01/17/2016 1115   KETONESUR NEGATIVE 11/16/2020 1330   PROTEINUR NEGATIVE 11/16/2020 1330   UROBILINOGEN 0.2 11/16/2020 1330   NITRITE NEGATIVE 11/16/2020 1330   LEUKOCYTESUR TRACE (A) 11/16/2020 1330   Urine culture; lactobacillus  CBG: 62>84 TSH: 0.348 T4: 1.26  MR BRAIN WO CONTRAST  Result Date: 11/04/2020 CLINICAL DATA:  Neuro deficit, acute, stroke suspected. Additional history provided: Syncopal episode, no head trauma, generalized weakness, leg pain. EXAM: MRI HEAD WITHOUT CONTRAST TECHNIQUE: Multiplanar, multiecho pulse sequences of the brain and surrounding structures were obtained without intravenous contrast. COMPARISON:  Head CT 07/21/2010. FINDINGS: Brain: Cerebral volume is normal. No cord encephalomalacia is identified. Nonspecific punctate foci of T2/FLAIR hyperintensity within the anterior left frontal lobe white matter (series 6, image 13) and within the left temporal occipital white matter (series 6, image 11). There is no acute infarct. No evidence of intracranial mass. No chronic intracranial blood products. No extra-axial fluid collection. No midline shift. Vascular: Expected proximal arterial flow voids. Skull and upper cervical spine: No focal marrow lesion. Sinuses/Orbits: Visualized orbits show no acute finding. No significant paranasal sinus disease. IMPRESSION: No evidence of acute intracranial abnormality. Two nonspecific punctate foci of T2/FLAIR hyperintensity within the left cerebral white matter, one within the left frontal lobe and one within the left temporal-occipital lobes. Otherwise unremarkable noncontrast MRI appearance of the brain. Electronically Signed   By: Kellie Simmering DO   On: 11/04/2020 10:24    Assessment & Plan:    Pre-syncope Acute, persistent. Unclear if pre-syncope vs dizziness. Appears symptoms are more dizziness today with overlying unilateral headache concerning for migraine. Unclear etiology. Extensive work up thus far including MRI, EKG, CBC, CMP, UA. TSH/T4 consistent with subclinical hyperthyroidism. [place] p[place] negative. No other symptoms to suggest Meniere disease. No red flags on exam today to indicate need for repeat imaging. Orthostatics negative although heart rate drops upon sitting with development of dizziness. Has chronic history of bradycardia with prior work up in 2019 including holter monitor which was normal. She has cardiology appointment scheduled for Tuesday. Unclear if migraine headache is contributing to dizziness or dizziness is contributing to headache. Provided symptomatic treatment today with encouragement to follow up with cardiology and neurology as scheduled. - Toradol 15mg  IM x 1 - Rx for Meclezine 12.5-25mg  TID PRN - recommended  - encouraged to drink 2L of water a day - patient to file for FMLA to allow her to leave work as needed for symptoms and to allow her to follow up for appointments for evaluation  - strict return precautions discussed   Acute nonintractable headache Acute. Appears migrainous in nature. No prior history of migraines. Recommended adequate hydration, Excedrin, and rest.  - Toradol 15mg  IM x 1 for acute relief  No orders of the defined types were placed in this encounter.  Meds ordered this encounter  Medications  . ketorolac (TORADOL) 15 MG/ML injection 15 mg  . meclizine (ANTIVERT) 25 MG tablet    Sig: Take 0.5-1 tablets (12.5-25 mg total) by mouth 3 (three) times daily as needed for dizziness.    Dispense:  30 tablet    Refill:  0      Mina Marble, DO PGY-3, [facility] 11/17/2020 5:51 PM

## 2020-11-17 NOTE — Patient Instructions (Signed)
I am so sorry you are feeling so bad. We gave you an injection of Toradol which should help your headache.  You should avoid ibuprofen for the next 24 hours. I recommend trying Excedrin for your headache if it recurs or persists I have given you a trial of meclizine which you can take up to 3 times a day as needed for dizziness Your homework is to drink at least 2 L of water per day for the next week until you follow-up with cardiology. Please obtain the FMLA paperwork and provide to your PCP to have completed Please follow-up with neurology and cardiology as scheduled Reasons to return or go to the ED include headache that does not respond to medications, vision changes, weakness, persistent vomiting, or any worsening symptoms.

## 2020-11-22 ENCOUNTER — Encounter: Payer: Self-pay | Admitting: Physical Therapy

## 2020-11-22 ENCOUNTER — Encounter: Payer: Self-pay | Admitting: Family Medicine

## 2020-11-22 ENCOUNTER — Other Ambulatory Visit: Payer: Self-pay

## 2020-11-22 ENCOUNTER — Ambulatory Visit: Payer: No Typology Code available for payment source | Admitting: Physical Therapy

## 2020-11-22 DIAGNOSIS — M357 Hypermobility syndrome: Secondary | ICD-10-CM

## 2020-11-22 DIAGNOSIS — M6281 Muscle weakness (generalized): Secondary | ICD-10-CM | POA: Diagnosis not present

## 2020-11-22 DIAGNOSIS — M25511 Pain in right shoulder: Secondary | ICD-10-CM | POA: Diagnosis not present

## 2020-11-22 DIAGNOSIS — G8929 Other chronic pain: Secondary | ICD-10-CM

## 2020-11-22 DIAGNOSIS — M25512 Pain in left shoulder: Secondary | ICD-10-CM | POA: Diagnosis not present

## 2020-11-22 NOTE — Therapy (Signed)
[place], [place], [address] Phone: [phone]   Fax:  [phone]  Physical Therapy Treatment  Patient Details  Name: Caroline Jensen MRN: [id number] Date of Birth: [birth year] Referring Provider (PT): Darrelyn Hillock, DO   Encounter Date: 11/22/2020   PT End of Session - 11/22/20 1135    Visit Number 9    Number of Visits 13    Date for PT Re-Evaluation 11/15/20    Authorization Type Focus    PT Start Time 1115    PT Stop Time 1145    PT Time Calculation (min) 30 min           Past Medical History:  Diagnosis Date  . Atopic dermatitis 09/25/2020  . Breast skin changes 12/12/2016  . Fatigue 02/01/2017  . Genital warts   . Low thyroid stimulating hormone (TSH) level 02/01/2017  . Shoulder pain, bilateral 01/06/2015    Past Surgical History:  Procedure Laterality Date  . LASER ABLATION OF CONDYLOMAS      There were no vitals filed for this visit.       [facility] PT Assessment - 11/22/20 0001      AROM   Overall AROM Comments no pain with cross body motions in bilateral UE -today, she does have pain with reaching back into shoulder extension - at end range only    Right Shoulder Flexion 165 Degrees   pain after 165 can go to 170 but with pain                        [facility] Adult PT Treatment/Exercise - 11/22/20 0001      Shoulder Exercises: Supine   Protraction 20 reps;Both    Protraction Weight (lbs) 3    Horizontal ABduction 20 reps    Theraband Level (Shoulder Horizontal ABduction) Level 3 (Green);Level 2 (Red)    External Rotation 20 reps    Theraband Level (Shoulder External Rotation) Level 3 (Green)    Diagonals Strengthening;Both;10 reps    Theraband Level (Shoulder Diagonals) Level 3 (Green)      Shoulder Exercises: Prone   Other Prone Exercises Bird dogs 10 sec x 10 each      Shoulder Exercises: Standing   External Rotation 20 reps   bilat   Theraband Level (Shoulder  External Rotation) Level 3 (Green)    Extension 20 reps    Theraband Level (Shoulder Extension) Level 3 (Green)    Row 20 reps    Theraband Level (Shoulder Row) Level 3 (Green)    Other Standing Exercises counter push up -tricep x 20    Other Standing Exercises green band tricep pull x 20 each, 3# bicep curls x 20 bilat      Shoulder Exercises: ROM/Strengthening   UBE (Upper Arm Bike) L1.5 3 minutes each way                       PT Long Term Goals - 11/22/20 1136      PT LONG TERM GOAL #1   Title Pt will be I with HEP for bilateral shoulder stability, strength    Time 6    Period Weeks    Status On-going      PT LONG TERM GOAL #2   Title Pt will be more aware and mindful of joint positions, posture related to ADLs and work    Baseline she reports she tries to be mindful  Time 6    Period Weeks    Status Achieved      PT LONG TERM GOAL #3   Title Pt will demo no pain with overhead and cross body reaching(>120 deg) for home tasks, dressing, work    Time 6    Period Weeks    Status Achieved      PT LONG TERM GOAL #4   Title Pt will report pain at work no more than 3/10 with normal work duties (pulling , lifting)    Baseline improvement, not lifting much lately, no pain last 2 weeks with OR training so unsure if will have pain with returning to normal position tomorrow.    Time 6    Period Weeks    Status On-going                 Plan - 11/22/20 1116    Clinical Impression Statement Pt continues to reports less frequent pain episodes with the last pain being 1 week ago with cleaning out the ashes in the fireplace. Otherwise, her pain is mostly at night with trying to get comfortable lying on left shoulder. She has been in training at work for the last 2 weeks and has not needed to lift. She will return to her regular job duties tomorrow. She has improved reaching overhead and no pain with cross body reaching today.  She reports pain with end range  flexion > 165 degrees and end range shoulder extension. LTG #2, #3 met.  Continued with previous treatment focus of shoulder strength and stability. Shorter session due to patient being late today.    PT Next Visit Plan add stability for scapula (closed chain) , modalities as needed. consider tape to shoulder    PT Home Exercise Plan [id number]: retract, ER, standing wall push up, plus, Quadruped bird dog and mod plank           Patient will benefit from skilled therapeutic intervention in order to improve the following deficits and impairments:  Decreased mobility,Pain,Impaired UE functional use,Increased fascial restricitons,Decreased strength,Hypermobility  Visit Diagnosis: Chronic left shoulder pain  Hypermobility syndrome  Muscle weakness (generalized)  Chronic right shoulder pain     Problem List Patient Active Problem List   Diagnosis Date Noted  . Acute nonintractable headache 11/17/2020  . Pre-syncope 11/10/2020  . Hematuria, microscopic 11/10/2020  . Cough in adult 09/25/2020  . Atopic dermatitis 09/25/2020  . Impingement syndrome of right shoulder 09/06/2020  . Intramural uterine fibroid 02/26/2020  . Urge incontinence 11/11/2019  . Palpitations 07/18/2017  . Hyperthyroidism 12/12/2016  . Allergic rhinitis 01/06/2015    Dorene Ar, PTA 11/22/2020, 12:04 PM  [facility] [address] [place], [place], [address] Phone: [phone]   Fax:  [phone]  Name: Caroline Jensen MRN: [id number] Date of Birth: [birth year]

## 2020-11-23 ENCOUNTER — Encounter: Payer: Self-pay | Admitting: Neurology

## 2020-11-23 ENCOUNTER — Ambulatory Visit
Payer: No Typology Code available for payment source | Admitting: Student in an Organized Health Care Education/Training Program

## 2020-11-23 ENCOUNTER — Ambulatory Visit (INDEPENDENT_AMBULATORY_CARE_PROVIDER_SITE_OTHER): Payer: No Typology Code available for payment source | Admitting: Neurology

## 2020-11-23 VITALS — BP 94/64 | HR 64 | Ht 62.0 in | Wt 108.0 lb

## 2020-11-23 DIAGNOSIS — E0590 Thyrotoxicosis, unspecified without thyrotoxic crisis or storm: Secondary | ICD-10-CM | POA: Diagnosis not present

## 2020-11-23 DIAGNOSIS — R55 Syncope and collapse: Secondary | ICD-10-CM | POA: Diagnosis not present

## 2020-11-23 DIAGNOSIS — R519 Headache, unspecified: Secondary | ICD-10-CM

## 2020-11-23 DIAGNOSIS — H814 Vertigo of central origin: Secondary | ICD-10-CM | POA: Diagnosis not present

## 2020-11-23 MED ORDER — MULTI VITAMIN/MINERALS PO TABS: ORAL_TABLET | ORAL | Status: DC

## 2020-11-23 MED ORDER — FLUDROCORTISONE ACETATE 0.1 MG PO TABS
0.1000 mg | ORAL_TABLET | Freq: Every day | ORAL | 5 refills | Status: DC
Start: 2020-11-23 — End: 2021-02-22

## 2020-11-23 NOTE — Patient Instructions (Signed)
Syncope Syncope is when you pass out (faint) for a short time. It is caused by a sudden decrease in blood flow to the brain. Signs that you may be about to pass out include:  Feeling dizzy or light-headed.  Feeling sick to your stomach (nauseous).  Seeing all white or all black.  Having cold, clammy skin. If you pass out, get help right away. Call your local emergency services (911 in the [place] Do not drive yourself to the hospital. Follow these instructions at home: Watch for any changes in your symptoms. Take these actions to stay safe and help with your symptoms: Lifestyle  Do not drive, use machinery, or play sports until your doctor says it is okay.  Do not drink alcohol.  Do not use any products that contain nicotine or tobacco, such as cigarettes and e-cigarettes. If you need help quitting, ask your doctor.  Drink enough fluid to keep your pee (urine) pale yellow. General instructions  Take over-the-counter and prescription medicines only as told by your doctor.  If you are taking blood pressure or heart medicine, sit up and stand up slowly. Spend a few minutes getting ready to sit and then stand. This can help you feel less dizzy.  Have someone stay with you until you feel stable.  If you start to feel like you might pass out, lie down right away and raise (elevate) your feet above the level of your heart. Breathe deeply and steadily. Wait until all of the symptoms are gone.  Keep all follow-up visits as told by your doctor. This is important. Get help right away if:  You have a very bad headache.  You pass out once or more than once.  You have pain in your chest, belly, or back.  You have a very fast or uneven heartbeat (palpitations).  It hurts to breathe.  You are bleeding from your mouth or your bottom (rectum).  You have black or tarry poop (stool).  You have jerky movements that you cannot control (seizure).  You are confused.  You have trouble  walking.  You are very weak.  You have vision problems. These symptoms may be an emergency. Do not wait to see if the symptoms will go away. Get medical help right away. Call your local emergency services (911 in the [place] Do not drive yourself to the hospital. Summary  Syncope is when you pass out (faint) for a short time. It is caused by a sudden decrease in blood flow to the brain.  Signs that you may be about to faint include feeling dizzy, light-headed, or sick to your stomach, seeing all white or all black, or having cold, clammy skin.  If you start to feel like you might pass out, lie down right away and raise (elevate) your feet above the level of your heart. Breathe deeply and steadily. Wait until all of the symptoms are gone. This information is not intended to replace advice given to you by your health care provider. Make sure you discuss any questions you have with your health care provider. Document Revised: 10/01/2019 Document Reviewed: 10/03/2017 Elsevier Patient Education  2021 [employer].

## 2020-11-23 NOTE — Progress Notes (Signed)
Provider:  Larey Seat, MD  Primary Care Physician:  [name], DO [address] [place] [address]     Referring Provider:          Chief Complaint according to patient   Patient presents with:    . New Patient (Initial Visit)     POSTURAL DIZZINESS , micturition syncope, near syncope, vertigo.         HISTORY OF PRESENT ILLNESS:  Caroline Jensen is a 35 - year- old African American female patient was seen here upon referral on 11/23/2020 from *Dr. Higinio Plan, DO, and Lavell Anchors, FNP.   Chief concern according to patient : "The patient lost awareness on the 11-04-2020, and she collapsed forward, her fall was broken by a Mudlogger.  She had spells while going to the bathroom, and while being seated or standing . Legs buckle. " She was given fluids each time, 2 spells .  Was told she was dehydrated.     Caroline Jensen  has a past medical history of Atopic dermatitis (09/25/2020), Breast skin changes (12/12/2016), Fatigue (02/01/2017), headaches,  Low thyroid stimulating hormone (TSH) level (02/01/2017), and Shoulder pain, bilateral (01/06/2015)., spells of dizziness with vertigo. LOC.    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   Tobacco use none .  ETOH use;none , Caffeine intake in form of Soda( not daily ) .   Arrival date & time: 11/04/20  0143   History    Chief Complaint  Patient presents with  . Loss of Consciousness    Caroline Jensen is a 35 y.o. female.  HPI 35 year old female G3, P3 presents today with near syncope episode.  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  She ate prior to coming into work.  She arrived at 11:00 in the episode occurred around 1230.  She describes feeling very lightheaded.  Her coworkers went to help her to sit down and she had a near syncopal episode.  She felt very weak.  She reports weakness in all extremities but felt that her left lower extremity was weaker than others.  Has some difficulty  walking after arrival in the emergency department.  She did not completely lose consciousness.  She did not fall and is not injured.  She has previously been seen for palpitations and had a loop recorder in place that showed heart rate up to 112 (high as the 90s her lowest heart rate at 46.   MRI was normal, 2 punctate lesions in left frontal lobe, unspezific. 11-04-2020    Review of Systems: Out of a complete 14 system review, the patient complains of only the following symptoms, and all other reviewed systems are negative.:  Dizziness.   Night shift worker, Therapist, sports.   Social History   Socioeconomic History  . Marital status: Single    Spouse name: Not on file  . Number of children: Not on file  . Years of education: Not on file  . Highest education level: Not on file  Occupational History  . Not on file  Tobacco Use  . Smoking status: Never Smoker  . Smokeless tobacco: Never Used  Vaping Use  . Vaping Use: Never used  Substance and Sexual Activity  . Alcohol use: No    Alcohol/week: 0.0 standard drinks  . Drug use: No  . Sexual activity: Not Currently    Birth control/protection: None, Abstinence  Other Topics Concern  .  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 Drug abuse Father   . Cancer Paternal Aunt        leukemia  . Hypertension Maternal Aunt   . Stroke Other        unknown  . Cirrhosis Maternal Grandmother   . Urticaria Brother   . Eczema Son   . Diabetes Neg Hx   . Heart disease Neg Hx   . Allergic rhinitis Neg Hx   . Asthma Neg Hx     Past Medical History:  Diagnosis Date  . Atopic  dermatitis 09/25/2020  . Breast skin changes 12/12/2016  . Fatigue 02/01/2017  . Genital warts   . Low thyroid stimulating hormone (TSH) level 02/01/2017  . Shoulder pain, bilateral 01/06/2015    Past Surgical History:  Procedure Laterality Date  . LASER ABLATION OF CONDYLOMAS       Current Outpatient Medications on File Prior to Visit  Medication Sig Dispense Refill  . triamcinolone ointment (KENALOG) 0.5 % Apply 1 application topically 2 (two) times daily. For moderate to severe eczema.  Do not use for more than 1 week at a time. 60 g 3  . meclizine (ANTIVERT) 25 MG tablet Take 0.5-1 tablets (12.5-25 mg total) by mouth 3 (three) times daily as needed for dizziness. (Patient not taking: Reported on 11/23/2020) 30 tablet 0   No current facility-administered medications on file prior to visit.    No Known Allergies  Physical exam:  Today's Vitals   11/23/20 1431  BP: 94/64  Pulse: 64  Weight: 108 lb (49 kg)  Height: 5\' 2"  (1.575 m)   Body mass index is 19.75 kg/m.   Wt Readings from Last 3 Encounters:  11/23/20 108 lb (49 kg)  11/17/20 109 lb 8 oz (49.7 kg)  11/09/20 106 lb 9.6 oz (48.4 kg)     Ht Readings from Last 3 Encounters:  11/23/20 5\' 2"  (1.575 m)  11/17/20 5\' 2"  (1.575 m)  10/19/20 5\' 2"  (1.575 m)     We need orthostatic BP and heart rate.    General: The patient is awake, alert and appears not in acute distress. The patient is well groomed. Head: Normocephalic, atraumatic. Neck is supple. Mallampati 2,  neck circumference:12 inches . Nasal airflow  patent.  Cardiovascular:  Regular rate and cardiac rhythm by pulse,  without distended neck veins. Respiratory: Lungs are clear to auscultation.  Skin:  Without evidence of ankle edema, or rash. Trunk: The patient's posture is erect.   Neurologic exam : The patient is awake and alert, oriented to place and time.   Memory subjective described as intact.  Attention span & concentration ability appears normal.   Speech is fluent,  without  dysarthria, dysphonia or aphasia.  Mood and affect are appropriate.   Cranial nerves: no loss of smell or taste reported  Pupils are equal and briskly reactive to light. Funduscopic exam deferred. .  Extraocular movements in vertical and horizontal planes were intact and without nystagmus. No Diplopia. Visual fields by finger perimetry are intact. Hearing was  intact to soft voice and finger rubbing.    Facial sensation intact to fine touch.  Facial motor strength is symmetric and tongue and uvula move midline.  Neck ROM : rotation, tilt and flexion extension were normal for age and shoulder shrug was symmetrical.    Motor exam:  Symmetric bulk, tone and ROM.   Normal tone without cog -wheeling, symmetric grip strength .   Sensory: Proprioception tested in the upper extremities was normal.   Coordination: Rapid alternating movements in the fingers/hands were of normal speed.  The Finger-to-nose maneuver was intact without evidence of ataxia, dysmetria or tremor.   Gait and station: Patient could rise unassisted from a seated position, walked without assistive device.  Stance is of normal width/ base and the patient turned with 3 steps.  She appeared slightly unsteady , but did not drift.  Toe and heel walk were deferred.  ROMBERG negative.  Deep tendon reflexes: in the  upper and lower extremities are symmetric and intact.  Babinski response was deferred.        After spending a total time of  45 minutes face to face and additional time for physical and neurologic examination, review of laboratory studies,  personal review of imaging studies, reports and results of other testing and review of referral information / records as far as provided in visit, I have established the following assessments:  1) rapid head turning to the left can cause vertigo sensation. BPV 2) orthostatic BP today with heart rate. -she felt dizzy after standing up from a seated  position.   Supine blood pressure was 92/58 with a pulse rate of only 55 bpm, regular pulse.  Seated blood pressure was 100/78 with a pulse rate of 58 standing blood pressure was 102/78 with a heart rate of 70.  The patient did feel fainty or dizzy as she stood up.  I think we do have a problem with hypotension may be best to use a Florinef or similar medication to support a slightly higher blood pressure for her.  Patient arrived today here with a blood pressure of 94/64.   My Plan is to proceed with:  1) hydration and electrolyte intake encouraged.  2)  MRI brain with and without.  3) compression stockings.  4) florinef considered.    I would like to thank [name], DO and [name], Do [address],  [place] [address] for allowing me to meet with and to take care of this pleasant patient.   We will start florinef 0.1 mg once a day po , to be taken before work starts. .     I plan to follow up either personally or through our NP within 2-3  month.     Electronically signed by: Larey Seat, MD 11/23/2020 3:04 PM  [facility] Board certified by The [employer] of Sleep Medicine and Diplomate of the [employer] of Sleep Medicine. Board certified In Neurology through the [place], Fellow of the [employer] of Neurology. Medical Director of [facility].

## 2020-11-24 ENCOUNTER — Telehealth: Payer: Self-pay | Admitting: Neurology

## 2020-11-24 ENCOUNTER — Ambulatory Visit: Payer: No Typology Code available for payment source | Admitting: Physical Therapy

## 2020-11-24 ENCOUNTER — Encounter: Payer: Self-pay | Admitting: Physical Therapy

## 2020-11-24 ENCOUNTER — Other Ambulatory Visit: Payer: Self-pay

## 2020-11-24 DIAGNOSIS — G8929 Other chronic pain: Secondary | ICD-10-CM

## 2020-11-24 DIAGNOSIS — M6281 Muscle weakness (generalized): Secondary | ICD-10-CM

## 2020-11-24 DIAGNOSIS — M357 Hypermobility syndrome: Secondary | ICD-10-CM

## 2020-11-24 DIAGNOSIS — M25512 Pain in left shoulder: Secondary | ICD-10-CM | POA: Diagnosis not present

## 2020-11-24 DIAGNOSIS — M25511 Pain in right shoulder: Secondary | ICD-10-CM | POA: Diagnosis not present

## 2020-11-24 NOTE — Telephone Encounter (Signed)
pending w/[facility] healthy blue it can take up to 15 bussiness days to hear back then will do the auth for [facility]

## 2020-11-24 NOTE — Therapy (Signed)
[facility], [place], [address] Phone: [phone]   Fax:  [phone]  Physical Therapy Treatment/Renewal  Patient Details  Name: Caroline Jensen MRN: [id number] Date of Birth: October 13, 1985 Referring Provider (PT): Darrelyn Hillock, DO   Encounter Date: 11/24/2020   PT End of Session - 11/24/20 1621    Visit Number 10    Number of Visits 16    Date for PT Re-Evaluation 11/15/20    Authorization Type Focus    PT Start Time 1620    PT Stop Time 1640    PT Time Calculation (min) 20 min    Activity Tolerance Patient tolerated treatment well;Other (comment)    Behavior During Therapy WFL for tasks assessed/performed           Past Medical History:  Diagnosis Date  . Atopic dermatitis 09/25/2020  . Breast skin changes 12/12/2016  . Fatigue 02/01/2017  . Genital warts   . Low thyroid stimulating hormone (TSH) level 02/01/2017  . Shoulder pain, bilateral 01/06/2015    Past Surgical History:  Procedure Laterality Date  . LASER ABLATION OF CONDYLOMAS      There were no vitals filed for this visit.   Subjective Assessment - 11/24/20 1655    Subjective Patient improved overall but would like to cont 1 x per week.  Hard to hold her arm out for a long time, starts to get tired and have pain .    Currently in Pain? No/denies    Pain Score 3    none at rest, min with exercise   Pain Location Shoulder    Pain Orientation Left    Pain Descriptors / Indicators Aching    Pain Type Chronic pain    Pain Onset More than a month ago    Pain Frequency Intermittent    Aggravating Factors  useing arm, holding it out , lifting    Pain Relieving Factors rest    Effect of Pain on Daily Activities hard to work  frequent discomfort              [facility] PT Assessment - 11/24/20 0001      Assessment   Medical Diagnosis Rt shoulder pain    Referring Provider (PT) Darrelyn Hillock, DO    Onset Date/Surgical Date --   chronic    Hand Dominance Right    Next MD Visit 10/19/20    Prior Therapy No      Precautions   Precautions None      Restrictions   Weight Bearing Restrictions No      Balance Screen   Has the patient fallen in the past 6 months No      [place] residence    Living Arrangements Children    Additional Comments 3 children < 17      Prior Function   Level of Independence Independent    Vocation Full time employment    Vocation Requirements labor and delivery nurse    Leisure 3 children, work night , limited as a Technical brewer   Overall Cognitive Status Within Functional Limits for tasks assessed      Observation/Other Assessments   Focus on Therapeutic Outcomes (FOTO)  NT due to MCD , not set up      Sensation   Light Touch Appears Intact      Coordination   Gross Motor Movements are Fluid and Coordinated Not tested  Posture/Postural Control   Posture/Postural Control Postural limitations    Postural Limitations Anterior pelvic tilt;Increased lumbar lordosis;Forward head;Rounded Shoulders      AROM   Overall AROM Comments pain end range flexion abduction and ER, IR in Rt UE.  Lt UE not painful in ER.IR only flexion    Right Shoulder Flexion 165 Degrees    Right Shoulder Internal Rotation --   FR to mid/upper thoracic, min pain   Right Shoulder External Rotation --   WNL   Left Shoulder Flexion 170 Degrees    Left Shoulder Internal Rotation --   min pain , mid thoracic   Left Shoulder External Rotation --   WNL     Strength   Right Shoulder Flexion 4+/5    Right Shoulder ABduction 4/5    Right Shoulder Internal Rotation 5/5    Right Shoulder External Rotation 4+/5    Left Shoulder Flexion 4/5    Left Shoulder ABduction 4/5    Left Shoulder Internal Rotation 5/5    Left Shoulder External Rotation 4+/5                 PT Education - 11/24/20 1701    Education Details POC, improvements    Person(s) Educated Patient     Methods Explanation    Comprehension Verbalized understanding               PT Long Term Goals - 11/24/20 1644      PT LONG TERM GOAL #1   Title Pt will be I with HEP for bilateral shoulder stability, strength    Status On-going      PT LONG TERM GOAL #2   Title Pt will be more aware and mindful of joint positions, posture related to ADLs and work    Status Achieved      PT LONG TERM GOAL #3   Title Pt will demo no pain with overhead and cross body reaching(>120 deg) for home tasks, dressing, work    Baseline no pain adduction , can be painful overhead on LUE    Status On-going      PT LONG TERM GOAL #4   Title Pt will report pain at work no more than 3/10 with normal work duties (pulling , lifting)    Baseline has not been working nornal work duties for about 2 weeks    Status On-going              Patient shortened session due to weather/tornado warning. She was moved to the center of the building.  No ther ex but came to an agreement about her POC and verbal review of body mechanics, work duties, aggravating factors.      Plan - 11/24/20 1626    Clinical Impression Statement Patient has shown improvement in Rt UE , has increased pain in L UE similar to that of Rt UE.  She cont to have issues with end range overhead lifting and functional reach to IR.  She is being worked up by caridology and has alot of appts right now.  She will benefit from PT to improve her abiltiy to work, lift and complete ADLs without increased pain .    Personal Factors and Comorbidities Comorbidity 1;Time since onset of injury/illness/exacerbation    Comorbidities chronic joint pain ,eczema    Examination-Activity Limitations Bathing;Carry;Reach Overhead;Caring for Others;Lift;Sleep;Hygiene/Grooming    Examination-Participation Restrictions Interpersonal Relationship;Occupation;Cleaning;Community Activity    Stability/Clinical Decision Making Stable/Uncomplicated    Clinical Decision Making  Low  Rehab Potential Excellent    PT Frequency 1x / week    PT Duration 6 weeks    PT Treatment/Interventions ADLs/Self Care Home Management;Cryotherapy;Therapeutic exercise;Patient/family education;Taping;Functional mobility training;Iontophoresis 4mg /ml Dexamethasone;Moist Heat;Ultrasound;Passive range of motion;Neuromuscular re-education;Therapeutic activities;Manual techniques;Electrical Stimulation    PT Next Visit Plan add stability for scapula (closed chain) , modalities as needed. consider tape to shoulder    PT Home Exercise Plan [id number]: retract, ER, standing wall push up, plus, Quadruped bird dog and mod plank    Consulted and Agree with Plan of Care Patient           Patient will benefit from skilled therapeutic intervention in order to improve the following deficits and impairments:  Decreased mobility,Pain,Impaired UE functional use,Increased fascial restricitons,Decreased strength,Hypermobility  Visit Diagnosis: Chronic left shoulder pain  Hypermobility syndrome  Muscle weakness (generalized)  Chronic right shoulder pain     Problem List Patient Active Problem List   Diagnosis Date Noted  . Acute nonintractable headache 11/17/2020  . Pre-syncope 11/10/2020  . Hematuria, microscopic 11/10/2020  . Cough in adult 09/25/2020  . Atopic dermatitis 09/25/2020  . Impingement syndrome of right shoulder 09/06/2020  . Intramural uterine fibroid 02/26/2020  . Urge incontinence 11/11/2019  . Palpitations 07/18/2017  . Hyperthyroidism 12/12/2016  . Allergic rhinitis 01/06/2015    Fernand Sorbello 11/24/2020, 5:06 PM  [facility] [address] [place], [place], [address] Phone: [phone]   Fax:  [phone]  Name: Makalya Nave MRN: [id number] Date of Birth: Oct 29, 1985  Raeford Razor, PT 11/24/20 5:06 PM Phone: [phone] Fax: [phone]

## 2020-11-25 ENCOUNTER — Telehealth: Payer: Self-pay | Admitting: Family Medicine

## 2020-11-25 NOTE — Telephone Encounter (Signed)
Late documentation, spoke with patient around 1930 yesterday evening, 3/23, in reference to her Mychart response for her FMLA paperwork.   Completed FMLA for intermittent leave when experiencing flare of symptoms x 12 weeks. Faxed to management.   Additionally discussed her new medications including meclizine and florinef. She was scheduled for work this evening (about an hour after our discussion) and was very nervous to try either prior to a shift. She was feeling less dizzy and headache improved from when her mychart message was sent, discuss additionally tylenol, hydration, and slow transitions from sitting/standing/rotation. Given her concern, discussed starting her florinef on day she has off. Meclizine only if acutely dizzy, try at home first and if tolerates well can use at different times if needed.   [name], DO

## 2020-11-29 ENCOUNTER — Other Ambulatory Visit: Payer: Self-pay

## 2020-11-29 ENCOUNTER — Ambulatory Visit (INDEPENDENT_AMBULATORY_CARE_PROVIDER_SITE_OTHER): Payer: No Typology Code available for payment source | Admitting: Family Medicine

## 2020-11-29 ENCOUNTER — Encounter: Payer: Self-pay | Admitting: Family Medicine

## 2020-11-29 VITALS — BP 90/60 | Ht 62.0 in | Wt 106.0 lb

## 2020-11-29 DIAGNOSIS — M25511 Pain in right shoulder: Secondary | ICD-10-CM

## 2020-11-29 DIAGNOSIS — M25512 Pain in left shoulder: Secondary | ICD-10-CM

## 2020-11-29 NOTE — Progress Notes (Signed)
PCP: [name], DO  Subjective:   HPI: Patient is a 35 y.o. female here for right shoulder pain.  1/3: Caroline Jensen is a 35 year old female presenting for evaluation of progressive subacute/chronic right shoulder pain for the past approximate 6 months.  She also endorses some discomfort within her left shoulder, however right is the most concerning.  No preceding injury or trauma.  Throbbing/sore in nature.  Seems to be worse with overhead activities and heavy lifting.  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  Denies any associated muscle weakness, numbness/tingling, popping/locking.  She was diagnosed with R rotator cuff bursitis/impingement in 2016, received injection with resolution of pain until restarting as above.  2/15: Patient reports she's not much better since last visit. Pain still with overhead motions and left shoulder starting to hurt worse more as well. Did not feel like the injection right shoulder provided any benefit. Not taking any medications. Worse at work with lifting patients. + night pain.  3/28: Patient is mildly improved compared to last visit. Left shoulder bothers her more than right now. Has been doing physical therapy and home exercises. Has not tried meloxicam yet. Pain worse with lifting. Worse lying on side also but cannot lie on back due to pain in back when she does this.  Past Medical History:  Diagnosis Date  . Atopic dermatitis 09/25/2020  . Breast skin changes 12/12/2016  . Fatigue 02/01/2017  . Genital warts   . Low thyroid stimulating hormone (TSH) level 02/01/2017  . Shoulder pain, bilateral 01/06/2015    Current Outpatient Medications on File Prior to Visit  Medication Sig Dispense Refill  . fludrocortisone (FLORINEF) 0.1 MG tablet Take 1 tablet (0.1 mg total) by mouth daily. 30 tablet 5  . meclizine (ANTIVERT) 25 MG tablet Take 0.5-1 tablets (12.5-25 mg total) by mouth 3 (three) times daily as needed  for dizziness. (Patient not taking: Reported on 11/23/2020) 30 tablet 0  . Multiple Vitamins-Minerals (MULTI VITAMIN/MINERALS) TABS Bid po    . triamcinolone ointment (KENALOG) 0.5 % Apply 1 application topically 2 (two) times daily. For moderate to severe eczema.  Do not use for more than 1 week at a time. 60 g 3   No current facility-administered medications on file prior to visit.    Past Surgical History:  Procedure Laterality Date  . LASER ABLATION OF CONDYLOMAS      No Known Allergies  Social History   Socioeconomic History  . Marital status: Single    Spouse name: Not on file  . Number of children: Not on file  . Years of education: Not on file  . Highest education level: Not on file  Occupational History  . Not on file  Tobacco Use  . Smoking status: Never Smoker  . Smokeless tobacco: Never Used  Vaping Use  . Vaping Use: Never used  Substance and Sexual Activity  . Alcohol use: No    Alcohol/week: 0.0 standard drinks  . Drug use: No  . Sexual activity: Not Currently    Birth control/protection: None, Abstinence  Other Topics Concern  . 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 Drug abuse Father   . Cancer Paternal Aunt        leukemia  . Hypertension Maternal Aunt   . Stroke Other        unknown  . Cirrhosis Maternal Grandmother   . Urticaria Brother   . Eczema Son   . Diabetes Neg Hx   . Heart disease Neg Hx   . Allergic rhinitis Neg Hx   . Asthma Neg Hx     BP 90/60   Ht 5\' 2"  (1.575 m)   Wt 106 lb (48.1 kg)   LMP 10/30/2020   BMI  19.39 kg/m   Fre[facility] Adult Exercise 09/06/2020  Frequency of aerobic exercise (# of days/week) 0  Average time in minutes 0  Frequency of strengthening activities (# of days/week) 0    No flowsheet data found.  Review of Systems: See HPI above.     Objective:  Physical Exam:  Gen: NAD, comfortable in exam room  Right shoulder: No swelling, ecchymoses.  No gross deformity. No TTP. FROM with painful arc. Negative Hawkins, positive Neers. Negative Yergasons. Strength 5/5 with empty can and resisted internal/external rotation.  Minimally positive empty can. Negative apprehension. Mild sulcus. NV intact distally.  Left shoulder: No swelling, ecchymoses.  No gross deformity. No TTP. FROM with painful arc. Negative Hawkins, positive Neers. Negative Yergasons. Strength 5/5 with empty can and resisted internal/external rotation.  Mild positive empty can. Negative apprehension. Mild sulcus. NV intact distally.   Assessment & Plan:  1. Bilateral shoulder pain - 2/2 rotator cuff impingement with mild multidirectional instability.  Ultrasound was reassuring.  Not much benefit with right subacromial injection.  Mild improvement with PT - continue with this.  She will try the meloxicam for a short period.  Tylenol if needed.  She has had borderline low blood pressure and some dizziness at times standing related to this so will not trial nitro patches.  Consider MRI of more symptomatic shoulder if not improving.  F/u in 6 weeks.

## 2020-11-29 NOTE — Patient Instructions (Signed)
Continue physical therapy and home exercises. Take the meloxicam for 5-7 days to see if it helps you then as needed - make sure you take it with food. Ok to take tylenol too if you need to. If not improving over the next few weeks at least give me a call and we will go ahead with an MRI of your worse shoulder. Otherwise follow up with me in 6 weeks.

## 2020-11-29 NOTE — Progress Notes (Signed)
Cardiology Office Note:    Date:  12/06/2020   ID:  Caroline Jensen, DOB 07/15/1986, MRN [id number]  PCP:  [name], DO  Cardiologist:  No primary care provider on file.  Electrophysiologist:  None   Referring MD: Scot Jun, FNP   Chief Complaint  Patient presents with  . Dizziness    History of Present Illness:    Caroline Jensen is a 35 y.o. female with a hx of allergic rhinitis, subclinical hypothyroidism who presents as an urgent care follow-up for lightheadedness.  She was seen in urgent care on 11/16/2020 with dizziness.  Thought to be dehydrated, given IV fluids and reported some improvement.  She was referred to neurology and cardiology for further evaluation.  She previously was seen by Dr. Illene Bolus for evaluation of palpitations in 2019.  30-day monitor showed no significant arrhythmias.  Seen by neurology 3/22, had orthostatics which showed no change in BP with position, but 15 bpm increase in heart rate with standing.  MRI brain was ordered.  She was prescribed compression stockings and Florinef.  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  She reports she had been standing and felt like she had to use the bathroom and after urinating stood up and felt like was going to pass out.  She went to the nursing station and lost feeling in her arms and legs.  Reports heart rate was in the 40s.  She had another episode on 3/15.  She felt dizzy and stood up and felt like she was going to pass out.  Lost feeling in her legs.  She went to urgent care at that time.  She does report she has been having palpitations a couple times per week, lasting for few minutes.  Feels like heart is racing during episodes, has noted heart rate up to 160s.  She denies any shortness of breath but reports occasional chest pain.  Describes as discomfort on the right side of her chest, occurs at rest, can last for few  minutes.  She does not exercise.  No smoking history.  No history of heart disease in her immediate family..   Past Medical History:  Diagnosis Date  . Atopic dermatitis 09/25/2020  . Breast skin changes 12/12/2016  . Fatigue 02/01/2017  . Genital warts   . Low thyroid stimulating hormone (TSH) level 02/01/2017  . Shoulder pain, bilateral 01/06/2015    Past Surgical History:  Procedure Laterality Date  . LASER ABLATION OF CONDYLOMAS      Current Medications: Current Meds  Medication Sig  . fludrocortisone (FLORINEF) 0.1 MG tablet Take 1 tablet (0.1 mg total) by mouth daily.  . meclizine (ANTIVERT) 25 MG tablet Take 0.5-1 tablets (12.5-25 mg total) by mouth 3 (three) times daily as needed for dizziness.  . Multiple Vitamins-Minerals (MULTI VITAMIN/MINERALS) TABS Bid po  . triamcinolone ointment (KENALOG) 0.5 % Apply 1 application topically 2 (two) times daily. For moderate to severe eczema.  Do not use for more than 1 week at a time.     Allergies:   Patient has no known allergies.   Social History   Socioeconomic History  . Marital status: Single    Spouse name: Not on file  . Number of children: Not on file  . Years of education: Not on file  . Highest education level: Not on file  Occupational History  . Not on file  Tobacco  Use  . Smoking status: Never Smoker  . Smokeless tobacco: Never Used  Vaping Use  . Vaping Use: Never used  Substance and Sexual Activity  . Alcohol use: No    Alcohol/week: 0.0 standard drinks  . Drug use: No  . Sexual activity: Not Currently    Birth control/protection: None, Abstinence  Other Topics Concern  . 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 There is no history of Diabetes, Heart disease, Allergic rhinitis, or Asthma.  ROS:   Please see the history of present illness.     All other systems reviewed and are negative.  EKGs/Labs/Other Studies Reviewed:    The following studies were reviewed today:   EKG:  EKG is ordered today.  The ekg ordered today demonstrates sinus bradycardia, rate 54, no ST sbnormalities, QTc 394  Recent Labs: 02/21/2020: ALT 11 11/04/2020: BUN 8; Creatinine, Ser 0.95; Hemoglobin 12.0; Platelets 216; Potassium 3.7; Sodium 137 11/09/2020: TSH 0.348  Recent Lipid Panel No results found for: CHOL, TRIG, HDL, CHOLHDL, VLDL, LDLCALC, LDLDIRECT  Physical Exam:    VS:  BP 100/68 (BP Location: Right Arm, Patient Position: Sitting, Cuff Size: Normal)   Pulse (!) 54   Ht 5\' 2"  (1.575 m)   Wt 106 lb 9.6 oz (48.4 kg)   BMI 19.50 kg/m     Wt Readings from Last 3 Encounters:  11/30/20 106 lb 9.6 oz (48.4 kg)  11/29/20 106 lb (48.1 kg)  11/23/20 108 lb (49 kg)     GEN: Well nourished, well developed in no acute distress HEENT: Normal NECK: No JVD; No carotid bruits LYMPHATICS: No lymphadenopathy CARDIAC: RRR, no murmurs, rubs, gallops RESPIRATORY:  Clear to auscultation without rales, wheezing or rhonchi  ABDOMEN: Soft, non-tender, non-distended MUSCULOSKELETAL:  No edema; No deformity  SKIN: Warm and dry NEUROLOGIC:  Alert and oriented x 3 PSYCHIATRIC:  Normal affect   ASSESSMENT:    1. Palpitations   2. Near syncope    PLAN:    Palpitations/near syncope/lightheadedness: Unclear cause.  Will check Zio patch x2 weeks to evaluate for arrhythmia.  Check echocardiogram to evaluate for structural heart  disease.  RTC in 3 months   Medication Adjustments/Labs and Tests Ordered: Current medicines are reviewed at length with the patient today.  Concerns regarding medicines are outlined above.  Orders Placed This Encounter  Procedures  . LONG TERM MONITOR (3-14 DAYS)  . EKG 12-Lead  . ECHOCARDIOGRAM COMPLETE   No orders of the defined types were placed in this encounter.   Patient Instructions  Medication Instructions:  Your physician recommends that you continue on your current medications as directed. Please refer to the Current Medication list given to you today.  Testing/Procedures: Your physician has requested that you have an echocardiogram. Echocardiography is a painless test that uses sound waves to create images of your heart. It provides your doctor  with information about the size and shape of your heart and how well your heart's chambers and valves are working. This procedure takes approximately one hour. There are no restrictions for this procedure.  This will be done at our [facility] location:  [place] has requested you wear your ZIO patch monitor___14___days.   This is a single patch monitor.  [employer] supplies one patch monitor per enrollment.  Additional stickers are not available.   Please do not apply patch if you will be having a Nuclear Stress Test, Echocardiogram, Cardiac CT, MRI, or Chest Xray during the time frame you would be wearing the monitor. The patch cannot be worn during these tests.  You cannot remove and re-apply the ZIO XT patch monitor.   Your ZIO patch monitor will be sent USPS Priority mail from [facility] directly to your home address. The monitor may also be mailed to a PO BOX if home delivery is not available.   It may take 3-5 days to receive your monitor after you have been enrolled.   Once you have received you monitor, please review enclosed  instructions.  Your monitor has already been registered assigning a specific monitor serial # to you.   Applying the monitor   Shave hair from upper left chest.   Hold abrader disc by orange tab.  Rub abrader in 40 strokes over left upper chest as indicated in your monitor instructions.   Clean area with 4 enclosed alcohol pads .  Use all pads to assure are is cleaned thoroughly.  Let dry.   Apply patch as indicated in monitor instructions.  Patch will be place under collarbone on left side of chest with arrow pointing upward.   Rub patch adhesive wings for 2 minutes.Remove white label marked "1".  Remove white label marked "2".  Rub patch adhesive wings for 2 additional minutes.   While looking in a mirror, press and release button in center of patch.  A small green light will flash 3-4 times .  This will be your only indicator the monitor has been turned on.     Do not shower for the first 24 hours.  You may shower after the first 24 hours.   Press button if you feel a symptom. You will hear a small click.  Record Date, Time and Symptom in the Patient Log Book.   When you are ready to remove patch, follow instructions on last 2 pages of Patient Log Book.  Stick patch monitor onto last page of Patient Log Book.   Place Patient Log Book in [place] box.  Use locking tab on box and tape box closed securely.  The [employer] and [employer] has [employer] on it.  Please place in mailbox as soon as possible.  Your physician should have your test results approximately 7 days after the monitor has been mailed back to [facility].   Call [place] at [phone] if you have questions regarding your ZIO XT patch monitor.  Call them immediately if you see an orange light blinking on your monitor.   If your monitor falls off in less than 4 days contact our Monitor department at [phone].  If your monitor becomes loose or falls off after 4 days call [employer] at [phone] for  suggestions on securing your monitor.    Follow-Up: At [facility], you and your health needs are our  priority.  As part of our continuing mission to provide you with exceptional heart care, we have created designated Provider Care Teams.  These Care Teams include your primary Cardiologist (physician) and Advanced Practice Providers (APPs -  Physician Assistants and Nurse Practitioners) who all work together to provide you with the care you need, when you need it.  We recommend signing up for the patient portal called "MyChart".  Sign up information is provided on this After Visit Summary.  MyChart is used to connect with patients for Virtual Visits (Telemedicine).  Patients are able to view lab/test results, encounter notes, upcoming appointments, etc.  Non-urgent messages can be sent to your provider as well.   To learn more about what you can do with MyChart, go to [email or web address].    Your next appointment:   3 month(s)  The format for your next appointment:   In Person  Provider:   Oswaldo Milian, MD       Signed, Donato Heinz, MD  12/06/2020 11:44 AM    [place]

## 2020-11-30 ENCOUNTER — Ambulatory Visit (INDEPENDENT_AMBULATORY_CARE_PROVIDER_SITE_OTHER): Payer: No Typology Code available for payment source

## 2020-11-30 ENCOUNTER — Ambulatory Visit (INDEPENDENT_AMBULATORY_CARE_PROVIDER_SITE_OTHER): Payer: No Typology Code available for payment source | Admitting: Cardiology

## 2020-11-30 ENCOUNTER — Encounter: Payer: Self-pay | Admitting: Family Medicine

## 2020-11-30 ENCOUNTER — Other Ambulatory Visit: Payer: Self-pay

## 2020-11-30 ENCOUNTER — Encounter: Payer: Self-pay | Admitting: Cardiology

## 2020-11-30 ENCOUNTER — Ambulatory Visit: Payer: No Typology Code available for payment source

## 2020-11-30 VITALS — BP 100/68 | HR 54 | Ht 62.0 in | Wt 106.6 lb

## 2020-11-30 DIAGNOSIS — R002 Palpitations: Secondary | ICD-10-CM

## 2020-11-30 DIAGNOSIS — M357 Hypermobility syndrome: Secondary | ICD-10-CM | POA: Diagnosis not present

## 2020-11-30 DIAGNOSIS — G8929 Other chronic pain: Secondary | ICD-10-CM

## 2020-11-30 DIAGNOSIS — M25511 Pain in right shoulder: Secondary | ICD-10-CM | POA: Diagnosis not present

## 2020-11-30 DIAGNOSIS — M25512 Pain in left shoulder: Secondary | ICD-10-CM

## 2020-11-30 DIAGNOSIS — M6281 Muscle weakness (generalized): Secondary | ICD-10-CM | POA: Diagnosis not present

## 2020-11-30 DIAGNOSIS — R55 Syncope and collapse: Secondary | ICD-10-CM

## 2020-11-30 NOTE — Therapy (Signed)
[place], [place], [address] Phone: [phone]   Fax:  [phone]  Physical Therapy Treatment  Patient Details  Name: Caroline Jensen MRN: [id number] Date of Birth: Dec 19, 1985 Referring Provider (PT): Darrelyn Hillock, DO   Encounter Date: 11/30/2020   PT End of Session - 11/30/20 1300    Visit Number 11    Number of Visits 16    Date for PT Re-Evaluation 01/05/21    Authorization Type Focus    PT Start Time 1219    PT Stop Time 1257    PT Time Calculation (min) 38 min    Activity Tolerance Patient tolerated treatment well;Patient limited by fatigue    Behavior During Therapy [facility] for tasks assessed/performed           Past Medical History:  Diagnosis Date  . Atopic dermatitis 09/25/2020  . Breast skin changes 12/12/2016  . Fatigue 02/01/2017  . Genital warts   . Low thyroid stimulating hormone (TSH) level 02/01/2017  . Shoulder pain, bilateral 01/06/2015    Past Surgical History:  Procedure Laterality Date  . LASER ABLATION OF CONDYLOMAS      There were no vitals filed for this visit.   Subjective Assessment - 11/30/20 1222    Subjective Patient denies pain upon arrival. She states she continues to have a hard time holding her arm out and reaching overhead. She reports that pain switches between left and right shoulders frequently.    Pertinent History chronic shoulder pain, hypermobility of joints, COVID 1/22.    Limitations Lifting;House hold activities    Diagnostic tests msk US showed some supraspinatus bursitis    Patient Stated Goals Patient would like to decreased pain and improve mobility (stop guarding so much)    Currently in Pain? No/denies    Pain Score 0-No pain    Pain Onset More than a month ago              [facility] PT Assessment - 11/30/20 0001      Assessment   Medical Diagnosis Rt shoulder pain    Referring Provider (PT) Darrelyn Hillock, DO             [facility] PT  Treatment/Exercise - 11/30/20 0001      Self-Care   Self-Care Other Self-Care Comments    Other Self-Care Comments  See patient education      Shoulder Exercises: Supine   Horizontal ABduction 20 reps    Theraband Level (Shoulder Horizontal ABduction) Level 3 (Green);Level 2 (Red)    External Rotation 20 reps    Theraband Level (Shoulder External Rotation) Level 3 (Green)    Flexion Strengthening;Both;20 reps;Theraband    Theraband Level (Shoulder Flexion) Level 3 (Green)    Flexion Limitations unilateral shoulder flexion while opposite hand stabilizes green theraband      Shoulder Exercises: Prone   Other Prone Exercises Plantigrade push up plus against high mat x 20    Other Prone Exercises Bird dogs alternating UE and opposite LE x 10 each UE/LE      Shoulder Exercises: Standing   External Rotation 10 reps;Strengthening;Both    Theraband Level (Shoulder External Rotation) Level 3 (Green)    Internal Rotation Strengthening;Both;15 reps;Theraband    Theraband Level (Shoulder Internal Rotation) Level 3 (Green)    Extension 20 reps    Theraband Level (Shoulder Extension) Level 3 (Green)    Row 20 reps    Theraband Level (Shoulder Row) Level 3 (Green)  Other Standing Exercises tricep extension bilaterally with green theraband x 15. 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    Person(s) Educated Patient    Methods Explanation    Comprehension Verbalized  understanding               PT Long Term Goals - 11/24/20 1644      PT LONG TERM GOAL #1   Title Pt will be I with HEP for bilateral shoulder stability, strength    Status On-going      PT LONG TERM GOAL #2   Title Pt will be more aware and mindful of joint positions, posture related to ADLs and work    Status Achieved      PT LONG TERM GOAL #3   Title Pt will demo no pain with overhead and cross body reaching(>120 deg) for home tasks, dressing, work    Baseline no pain adduction , can be painful overhead on LUE    Status On-going      PT LONG TERM GOAL #4   Title Pt will report pain at work no more than 3/10 with normal work duties (pulling , lifting)    Baseline has not been working nornal work duties for about 2 weeks    Status On-going                 Plan - 11/30/20 1301    Clinical Impression Statement Patient tolerated PT session well with minor shoulder pain during strengthening interventions. She quickly fatigued with resistance exercises but was able to complete sets and also reports being tired from working night shifts. She should continue to benefit from skilled PT intervention to improve ability to perform work and daily tasks, such as forward and overhead reaching and lifting.    Personal Factors and Comorbidities Comorbidity 1;Time since onset of injury/illness/exacerbation    Comorbidities chronic joint pain ,eczema    Examination-Activity Limitations Bathing;Carry;Reach Overhead;Caring for Others;Lift;Sleep;Hygiene/Grooming    Examination-Participation Restrictions Interpersonal Relationship;Occupation;Cleaning;Community Activity    Stability/Clinical Decision Making Stable/Uncomplicated    Rehab Potential Excellent    PT Frequency 1x / week    PT Duration 6 weeks    PT Treatment/Interventions ADLs/Self Care Home Management;Cryotherapy;Therapeutic exercise;Patient/family education;Taping;Functional mobility training;Iontophoresis 4mg /ml  Dexamethasone;Moist Heat;Ultrasound;Passive range of motion;Neuromuscular re-education;Therapeutic activities;Manual techniques;Electrical Stimulation    PT Next Visit Plan continue stability for scapula (closed chain) , modalities as needed. consider tape to shoulder    PT Home Exercise Plan [email or web address]: retract, ER, standing wall push up, plus, Quadruped bird dog and mod plank    Consulted and Agree with Plan of Care Patient           Patient will benefit from skilled therapeutic intervention in order to improve the following deficits and impairments:  Decreased mobility,Pain,Impaired UE functional use,Increased fascial restricitons,Decreased strength,Hypermobility  Visit Diagnosis: Chronic left shoulder pain  Hypermobility syndrome  Muscle weakness (generalized)  Chronic right shoulder pain     Problem List Patient Active Problem List   Diagnosis Date Noted  . Acute nonintractable headache 11/17/2020  . Pre-syncope 11/10/2020  . Hematuria, microscopic 11/10/2020  . Cough in adult 09/25/2020  . Atopic dermatitis 09/25/2020  . Impingement syndrome of right shoulder 09/06/2020  . Intramural uterine fibroid 02/26/2020  . Urge incontinence 11/11/2019  . Palpitations 07/18/2017  . Hyperthyroidism 12/12/2016  . Allergic rhinitis 01/06/2015    Haydee Monica, PT, DPT 11/30/20 1:26 PM  [facility] [address] [place], [place], [address] Phone: [phone]   Fax:  [phone]  Name: Jasmane Brockway MRN: [id number] Date of Birth: 1986/02/23

## 2020-11-30 NOTE — Telephone Encounter (Signed)
Patient sent a mychart message reporting FMLA was not received by employer. Do you happen to have a copy with you? I could not find it in the previously faxed pile from last week.

## 2020-11-30 NOTE — Patient Instructions (Signed)
Medication Instructions:  Your physician recommends that you continue on your current medications as directed. Please refer to the Current Medication list given to you today.  Testing/Procedures: Your physician has requested that you have an echocardiogram. Echocardiography is a painless test that uses sound waves to create images of your heart. It provides your doctor with information about the size and shape of your heart and how well your heart's chambers and valves are working. This procedure takes approximately one hour. There are no restrictions for this procedure.  This will be done at our [facility] location:  [place] has requested you wear your ZIO patch monitor___14___days.   This is a single patch monitor.  [employer] supplies one patch monitor per enrollment.  Additional stickers are not available.   Please do not apply patch if you will be having a Nuclear Stress Test, Echocardiogram, Cardiac CT, MRI, or Chest Xray during the time frame you would be wearing the monitor. The patch cannot be worn during these tests.  You cannot remove and re-apply the ZIO XT patch monitor.   Your ZIO patch monitor will be sent USPS Priority mail from [facility] directly to your home address. The monitor may also be mailed to a PO BOX if home delivery is not available.   It may take 3-5 days to receive your monitor after you have been enrolled.   Once you have received you monitor, please review enclosed instructions.  Your monitor has already been registered assigning a specific monitor serial # to you.   Applying the monitor   Shave hair from upper left chest.   Hold abrader disc by orange tab.  Rub abrader in 40 strokes over left upper chest as indicated in your monitor instructions.   Clean area with 4 enclosed alcohol pads .  Use all pads to assure are is cleaned thoroughly.  Let dry.   Apply patch  as indicated in monitor instructions.  Patch will be place under collarbone on left side of chest with arrow pointing upward.   Rub patch adhesive wings for 2 minutes.Remove white label marked "1".  Remove white label marked "2".  Rub patch adhesive wings for 2 additional minutes.   While looking in a mirror, press and release button in center of patch.  A small green light will flash 3-4 times .  This will be your only indicator the monitor has been turned on.     Do not shower for the first 24 hours.  You may shower after the first 24 hours.   Press button if you feel a symptom. You will hear a small click.  Record Date, Time and Symptom in the Patient Log Book.   When you are ready to remove patch, follow instructions on last 2 pages of Patient Log Book.  Stick patch monitor onto last page of Patient Log Book.   Place Patient Log Book in [place] box.  Use locking tab on box and tape box closed securely.  The [employer] and [employer] has [employer] on it.  Please place in mailbox as soon as possible.  Your physician should have your test results approximately 7 days after the monitor has been mailed back to [facility].   Call [place] at [phone] if you have questions regarding your ZIO XT patch monitor.  Call them immediately if you see an orange light blinking on your  monitor.   If your monitor falls off in less than 4 days contact our Monitor department at [phone].  If your monitor becomes loose or falls off after 4 days call [employer] at [phone] for suggestions on securing your monitor.    Follow-Up: At [facility], you and your health needs are our priority.  As part of our continuing mission to provide you with exceptional heart care, we have created designated Provider Care Teams.  These Care Teams include your primary Cardiologist (physician) and Advanced Practice Providers (APPs -  Physician Assistants and Nurse Practitioners) who all work  together to provide you with the care you need, when you need it.  We recommend signing up for the patient portal called "MyChart".  Sign up information is provided on this After Visit Summary.  MyChart is used to connect with patients for Virtual Visits (Telemedicine).  Patients are able to view lab/test results, encounter notes, upcoming appointments, etc.  Non-urgent messages can be sent to your provider as well.   To learn more about what you can do with MyChart, go to [email or web address].    Your next appointment:   3 month(s)  The format for your next appointment:   In Person  Provider:   Oswaldo Milian, MD

## 2020-12-01 NOTE — Telephone Encounter (Signed)
I have looked through the faxes from 3/20-3/25 and could not locate FMLA. I looked through the scan pile as well. I contacted patient to give her an update. Patient reports she will either upload a new form to mychart or have a new form faxed to our office. 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 Patient was appreciative and understanding.

## 2020-12-01 NOTE — Telephone Encounter (Signed)
No unfortunately I did not save a copy. I faxed it myself on the night between 3/23-24 and then placed it into the fax box to be additionally sent in case I did this wrong.   Can we recheck the previous fax pile to make sure? Otherwise I'll go ahead and re-complete the paperwork.   Thank you! [name], DO

## 2020-12-02 ENCOUNTER — Encounter: Payer: Self-pay | Admitting: Family Medicine

## 2020-12-02 DIAGNOSIS — R55 Syncope and collapse: Secondary | ICD-10-CM

## 2020-12-02 DIAGNOSIS — R002 Palpitations: Secondary | ICD-10-CM

## 2020-12-02 NOTE — Telephone Encounter (Signed)
Forms faxed to matrix, a copy was made for batch scanning, and a copy was placed up front for the patient to pick up. The patient has been updated.

## 2020-12-06 NOTE — Telephone Encounter (Signed)
Since [facility] there is no authorization require..  I called [facility] to set up the PA for the MRI and they said not referral was on file for Dr. Brett Fairy. I called the and informed her she needs to call her insurance to get the referral on file so I can do the PA for the MRI. She stated she would give her insurance a call and I gave her my direct number to call me.

## 2020-12-07 ENCOUNTER — Encounter: Payer: Self-pay | Admitting: Family Medicine

## 2020-12-08 NOTE — Telephone Encounter (Signed)
[facility] auth: [id number] (exp. 12/10/20 to 01/09/21) order sent to GI. They will reach out to the patient to schedule.

## 2020-12-15 ENCOUNTER — Other Ambulatory Visit: Payer: Self-pay

## 2020-12-15 ENCOUNTER — Ambulatory Visit: Payer: No Typology Code available for payment source | Attending: Family Medicine | Admitting: Physical Therapy

## 2020-12-15 DIAGNOSIS — M25511 Pain in right shoulder: Secondary | ICD-10-CM | POA: Diagnosis not present

## 2020-12-15 DIAGNOSIS — G8929 Other chronic pain: Secondary | ICD-10-CM

## 2020-12-15 DIAGNOSIS — M357 Hypermobility syndrome: Secondary | ICD-10-CM | POA: Diagnosis not present

## 2020-12-15 DIAGNOSIS — M25512 Pain in left shoulder: Secondary | ICD-10-CM | POA: Insufficient documentation

## 2020-12-15 DIAGNOSIS — M6281 Muscle weakness (generalized): Secondary | ICD-10-CM | POA: Diagnosis not present

## 2020-12-15 NOTE — Patient Instructions (Signed)
IONTOPHORESIS PATIENT PRECAUTIONS & CONTRAINDICATIONS:  . Redness under one or both electrodes can occur.  This characterized by a uniform redness that usually disappears within 12 hours of treatment. . Small pinhead size blisters may result in response to the drug.  Contact your physician if the problem persists more than 24 hours. . On rare occasions, iontophoresis therapy can result in temporary skin reactions such as rash, inflammation, irritation or burns.  The skin reactions may be the result of individual sensitivity to the ionic solution used, the condition of the skin at the start of treatment, reaction to the materials in the electrodes, allergies or sensitivity to dexamethasone, or a poor connection between the patch and your skin.  Discontinue using iontophoresis if you have any of these reactions and report to your therapist. . Remove the Patch or electrodes if you have any undue sensation of pain or burning during the treatment and report discomfort to your therapist. . Tell your Therapist if you have had known adverse reactions to the application of electrical current. . If using the Patch, the LED light will turn off when treatment is complete and the patch can be removed.  Approximate treatment time is 1-3 hours.  Remove the patch when light goes off or after 6 hours. . The Patch can be worn during normal activity, however excessive motion where the electrodes have been placed can cause poor contact between the skin and the electrode or uneven electrical current resulting in greater risk of skin irritation. Marland Kitchen Keep out of the reach of children.   . DO NOT use if you have a cardiac pacemaker or any other electrically sensitive implanted device. . DO NOT use if you have a known sensitivity to dexamethasone. . DO NOT use during Magnetic Resonance Imaging (MRI). . DO NOT use over broken or compromised skin (e.g. sunburn, cuts, or acne) due to the increased risk of skin reaction. . DO  NOT SHAVE over the area to be treated:  To establish good contact between the Patch and the skin, excessive hair may be clipped. . DO NOT place the Patch or electrodes on or over your eyes, directly over your heart, or brain. . DO NOT reuse the Patch or electrodes as this may cause burns to occur.   Access Code: [email or web address] URL: [email or web address] Date: 12/15/2020 Prepared by: Raeford Razor  Exercises Seated Scapular Retraction - 1 x daily - 7 x weekly - 2 sets - 10 reps - 5 hold Supine Shoulder External Rotation with Resistance - 2 x daily - 7 x weekly - 2 sets - 10 reps - 5 hold Scapular Protraction at Garvin - 1 x daily - 7 x weekly - 2 sets - 10 reps - 5 hold Wall Push Up with Plus - 1 x daily - 7 x weekly - 2 sets - 10 reps - 5 hold Scapular Stabilization - Elbow Support - 1 x daily - 7 x weekly - 2 sets - 10 reps - 10 hold Bird Dog - 1 x daily - 7 x weekly - 2 sets - 10 reps - 5 hold Standing Single Arm Elbow Flexion with Resistance - 1 x daily - 7 x weekly - 2 sets - 10 reps - 5 hold Prone Scapular Retraction - 1 x daily - 7 x weekly - 2 sets - 10 reps - 5 hold Prone Scapular Retraction Y - 1 x daily - 7 x weekly - 2 sets - 10 reps

## 2020-12-15 NOTE — Therapy (Addendum)
[place], [place], [address] Phone: [phone]   Fax:  [phone]  Physical Therapy Treatment / Discharge note  Patient Details  Name: Caroline Jensen MRN: [id number] Date of Birth: 05-21-1986 Referring Provider (PT): Darrelyn Hillock, DO   Encounter Date: 12/15/2020   PT End of Session - 12/15/20 1638     Visit Number 12    Number of Visits 16    Date for PT Re-Evaluation 01/05/21    Authorization Type [facility]    PT Start Time 5638    PT Stop Time 1700    PT Time Calculation (min) 43 min    Activity Tolerance Patient tolerated treatment well    Behavior During Therapy [facility] for tasks assessed/performed             Past Medical History:  Diagnosis Date   Atopic dermatitis 09/25/2020   Breast skin changes 12/12/2016   Fatigue 02/01/2017   Genital warts    Low thyroid stimulating hormone (TSH) level 02/01/2017   Shoulder pain, bilateral 01/06/2015    Past Surgical History:  Procedure Laterality Date   LASER ABLATION OF CONDYLOMAS      There were no vitals filed for this visit.   Subjective Assessment - 12/15/20 1623     Subjective I reached back with my L arm when in the car and it hurt my arm really bad. No pain right now.    Currently in Pain? No/denies                [facility] PT Assessment - 12/15/20 0001       AROM   Overall AROM Comments WFL with pain L arm in functional reach behind head , Rt UE pain with functional reach behind back                 [facility] Adult PT Treatment/Exercise - 12/16/20 0001       Shoulder Exercises: Prone   Retraction 10 reps    Extension 10 reps    Horizontal ABduction 1 Strengthening;10 reps    Other Prone Exercises Bird dogs alternating UE and opposite LE x 10 each UE/LE      Shoulder Exercises: Standing   Other Standing Exercises looped band series : external rotation, small range pulse , flexion above shoulder height then isometric  horizontal abd      Shoulder Exercises: ROM/Strengthening   UBE (Upper Arm Bike) 5 min in reverse L1      Modalities   Modalities Iontophoresis      Iontophoresis   Type of Iontophoresis Dexamethasone    Location L superior shoulder    Dose 1 cc    Time 6 hour patch                    PT Education - 12/16/20 0731     Education Details rationale for scapular stabilization, hypermobility and reaching beyond a "neutral zone"    Person(s) Educated Patient    Methods Explanation    Comprehension Verbalized understanding;Returned demonstration                 PT Long Term Goals - 11/24/20 1644       PT LONG TERM GOAL #1   Title Pt will be I with HEP for bilateral shoulder stability, strength    Status On-going      PT LONG TERM GOAL #2   Title Pt will be more aware and mindful of joint  positions, posture related to ADLs and work    Status Achieved      PT LONG TERM GOAL #3   Title Pt will demo no pain with overhead and cross body reaching(>120 deg) for home tasks, dressing, work    Baseline no pain adduction , can be painful overhead on LUE    Status On-going      PT LONG TERM GOAL #4   Title Pt will report pain at work no more than 3/10 with normal work duties (pulling , lifting)    Baseline has not been working nornal work duties for about 2 weeks    Status On-going                   Plan - 12/15/20 1639     Clinical Impression Statement Pt with incident of reaching with grocery bag behind her in the car and severe pain as a result.  Discussed ROM limits and awareness of joint positions that could potentially cause pain .  Worked on muscular endurance today with looped band as well as prone scapular exercises which she completed without pain .  Trial of ionto patch for Rt superior shoulder/acromian.    PT Treatment/Interventions ADLs/Self Care Home Management;Cryotherapy;Therapeutic exercise;Patient/family education;Taping;Functional mobility  training;Iontophoresis 38m/ml Dexamethasone;Moist Heat;Ultrasound;Passive range of motion;Neuromuscular re-education;Therapeutic activities;Manual techniques;Electrical Stimulation    PT Next Visit Plan continue stability for scapula (closed chain) , modalities as needed. consider tape to shoulder, ionto    PT Home Exercise Plan [email or web address]: retract, ER, standing wall push up, plus, Quadruped bird dog and mod plank, prone    Consulted and Agree with Plan of Care Patient             Patient will benefit from skilled therapeutic intervention in order to improve the following deficits and impairments:  Decreased mobility,Pain,Impaired UE functional use,Increased fascial restricitons,Decreased strength,Hypermobility  Visit Diagnosis: Chronic left shoulder pain  Hypermobility syndrome  Muscle weakness (generalized)  Chronic right shoulder pain     Problem List Patient Active Problem List   Diagnosis Date Noted   Acute nonintractable headache 11/17/2020   Pre-syncope 11/10/2020   Hematuria, microscopic 11/10/2020   Cough in adult 09/25/2020   Atopic dermatitis 09/25/2020   Impingement syndrome of right shoulder 09/06/2020   Intramural uterine fibroid 02/26/2020   Urge incontinence 11/11/2019   Palpitations 07/18/2017   Hyperthyroidism 12/12/2016   Allergic rhinitis 01/06/2015    Caroline Jensen 12/16/2020, 7:38 AM  [place] [address]Phone: [phone]  Fax:  [phone] Name: Caroline Jensen: [id number] of Birth: 1Mar 08, 1987 JRaeford Razor PT 12/16/20 7:38 AM Phone: [phone]: [phone]   PHYSICAL THERAPY DISCHARGE SUMMARY  Visits from Start of Care: 12  Current functional level related to goals / functional outcomes: See goals,   Remaining deficits: Current status unknown   Education / Equipment: HEP   Patient agrees to discharge. Patient goals were not  met. Patient is being discharged due to not returning since the last visit.   Kristoffer Leamon PT, DPT, LAT, ATC  02/28/21  10:53 AM

## 2020-12-22 ENCOUNTER — Other Ambulatory Visit: Payer: Self-pay

## 2020-12-22 ENCOUNTER — Ambulatory Visit
Admission: RE | Admit: 2020-12-22 | Discharge: 2020-12-22 | Disposition: A | Discharge status: Home / Self Care | Payer: No Typology Code available for payment source | Source: Ambulatory Visit | Attending: Neurology | Admitting: Neurology

## 2020-12-22 DIAGNOSIS — H814 Vertigo of central origin: Secondary | ICD-10-CM

## 2020-12-22 DIAGNOSIS — R55 Syncope and collapse: Secondary | ICD-10-CM

## 2020-12-22 DIAGNOSIS — E0590 Thyrotoxicosis, unspecified without thyrotoxic crisis or storm: Secondary | ICD-10-CM | POA: Diagnosis not present

## 2020-12-22 DIAGNOSIS — R519 Headache, unspecified: Secondary | ICD-10-CM

## 2020-12-22 MED ORDER — GADOBENATE DIMEGLUMINE 529 MG/ML IV SOLN
10.0000 mL | Freq: Once | INTRAVENOUS | Status: AC | PRN
  Administered 2020-12-22: 10 mL via INTRAVENOUS

## 2020-12-24 ENCOUNTER — Encounter: Payer: Self-pay | Admitting: Family Medicine

## 2020-12-24 ENCOUNTER — Ambulatory Visit (INDEPENDENT_AMBULATORY_CARE_PROVIDER_SITE_OTHER): Payer: No Typology Code available for payment source | Admitting: Family Medicine

## 2020-12-24 ENCOUNTER — Other Ambulatory Visit: Payer: Self-pay

## 2020-12-24 VITALS — BP 98/62 | HR 60 | Wt 107.8 lb

## 2020-12-24 DIAGNOSIS — G43809 Other migraine, not intractable, without status migrainosus: Secondary | ICD-10-CM | POA: Diagnosis not present

## 2020-12-24 DIAGNOSIS — R55 Syncope and collapse: Secondary | ICD-10-CM

## 2020-12-24 DIAGNOSIS — G8929 Other chronic pain: Secondary | ICD-10-CM

## 2020-12-24 DIAGNOSIS — R519 Headache, unspecified: Secondary | ICD-10-CM | POA: Diagnosis not present

## 2020-12-24 MED ORDER — SUMATRIPTAN SUCCINATE 50 MG PO TABS: 50.0000 mg | ORAL_TABLET | ORAL | 0 refills | Status: DC | PRN

## 2020-12-24 NOTE — Patient Instructions (Signed)
It was great to see you today.  I am glad to hear you have not had any recent episodes.  I will go ahead and send in a medication called Imitrex.  This would only be as needed if you have a severe headache, to see if this makes a difference.  This sometimes can make people feel more lightheaded, so if you are lightheaded/dizzy with this headache please hold off.

## 2020-12-24 NOTE — Progress Notes (Signed)
    SUBJECTIVE:   CHIEF COMPLAINT / HPI: F/u episodes/headaches   Caroline Jensen is a 35 year old presenting for follow-up of her recurrent presyncopal episodes and headaches.  Today she reports that she has not had any lightheadedness/dizziness episode since we last spoke via [place] on 3/31.  She did have a mild frontal headache last night that quickly went away, otherwise she has not had any headaches either.  She has been taking the Florinef every day, with the exception of an occasional missed dose.  She is also been drinking more water throughout the day, at least 32 ounces.  Her FMLA paperwork has been received and appropriate for now.  She just had her MRI brain completed on 4/20, has not been read.  She also completed her Zio patch however had no episodes while she was wearing this, no results yet.  Echo scheduled for 5/2.  Often her headaches are left-sided with photophobia/phonophobia.  Has to go in a quiet room to let them calm down.  Will feel a little lightheaded with some blurring of her vision occasionally with these headaches.  She is not currently sexually active, monitors monthly LMP.  PERTINENT  PMH / PSH: History of hyperthyroidism, palpitations, headaches, eczema   OBJECTIVE:   BP 98/62   Pulse 60   Wt 107 lb 12.8 oz (48.9 kg)   SpO2 99%   BMI 19.72 kg/m   General: Alert, NAD HEENT: NCAT, MMM Cardiac: RRR  Lungs: No increased WOB  Ext: Warm, dry, 2+ distal pulses Neuro: Alert and oriented.  EOMI, PERRLA.  Normal gait, able to move all extremities spontaneously and equally.  Speech easily understandable.  ASSESSMENT/PLAN:   Pre-syncope No episodes since 3/31, has been on Florinef daily per neurology.  Undergoing extensive work-up through neurology and cardiology, awaiting results of MRI brain and Zio patch.  Echo scheduled for 5/2.  Will continue to follow along.  Chronic headaches Fortunately no significant headaches in the past few weeks, however do have some  migrainous features.  Unclear if atypical migraine vs related with her episodes, can occasionally have headaches without it.  Rx'd Imitrex 50 mg, repeat in 2 hours if headache persists, if she has a recurrent severe headache to see if this has effect.  Counseled this may cause lightheadedness, to hold off if she is concurrently having significant lightheadedness/dizziness.     Follow-up in 1 month to check-in or sooner if needed.  [name], [place]

## 2020-12-26 ENCOUNTER — Encounter: Payer: Self-pay | Admitting: Family Medicine

## 2020-12-26 NOTE — Assessment & Plan Note (Signed)
Fortunately no significant headaches in the past few weeks, however do have some migrainous features.  Unclear if atypical migraine vs related with her episodes, can occasionally have headaches without it.  Rx'd Imitrex 50 mg, repeat in 2 hours if headache persists, if she has a recurrent severe headache to see if this has effect.  Counseled this may cause lightheadedness, to hold off if she is concurrently having significant lightheadedness/dizziness.

## 2020-12-26 NOTE — Assessment & Plan Note (Signed)
No episodes since 3/31, has been on Florinef daily per neurology.  Undergoing extensive work-up through neurology and cardiology, awaiting results of MRI brain and Zio patch.  Echo scheduled for 5/2.  Will continue to follow along.

## 2020-12-26 NOTE — Progress Notes (Signed)
IMPRESSION: This MRI of the brain with and without contrast shows the following: 1.   Single punctate T2/FLAIR hyperintense focus in the posterior left temporal lobe.  This is a nonspecific finding and is most consistent with either very minimal chronic microvascular ischemic change or the sequela of migraine headache.  It is not acute and does not enhance. 2.   No acute findings. 3.   Normal enhancement pattern.  GOOD RESULT, no Stroke, MS or TUMOR !      INTERPRETING PHYSICIAN:  Richard A. Felecia Shelling, MD, PhD, Charlynn Grimes

## 2020-12-27 ENCOUNTER — Telehealth: Payer: Self-pay | Admitting: *Deleted

## 2020-12-27 NOTE — Telephone Encounter (Signed)
-----   Message from Larey Seat, MD sent at 12/26/2020  6:02 PM EDT ----- IMPRESSION: This MRI of the brain with and without contrast shows the following: 1.   Single punctate T2/FLAIR hyperintense focus in the posterior left temporal lobe.  This is a nonspecific finding and is most consistent with either very minimal chronic microvascular ischemic change or the sequela of migraine headache.  It is not acute and does not enhance. 2.   No acute findings. 3.   Normal enhancement pattern.  GOOD RESULT, no Stroke, MS or TUMOR !      INTERPRETING PHYSICIAN:  Richard A. Felecia Shelling, MD, PhD, Charlynn Grimes

## 2020-12-27 NOTE — Telephone Encounter (Signed)
I spoke to the patient and provided her with the MRI brain results.

## 2021-01-03 ENCOUNTER — Other Ambulatory Visit: Payer: Self-pay

## 2021-01-03 ENCOUNTER — Ambulatory Visit (HOSPITAL_COMMUNITY): Payer: No Typology Code available for payment source | Attending: Cardiovascular Disease

## 2021-01-03 DIAGNOSIS — R55 Syncope and collapse: Secondary | ICD-10-CM | POA: Insufficient documentation

## 2021-01-03 DIAGNOSIS — R002 Palpitations: Secondary | ICD-10-CM | POA: Diagnosis not present

## 2021-01-03 LAB — ECHOCARDIOGRAM COMPLETE
Area-P 1/2: 3.7 cm2
S' Lateral: 2.5 cm

## 2021-01-10 ENCOUNTER — Ambulatory Visit (INDEPENDENT_AMBULATORY_CARE_PROVIDER_SITE_OTHER): Payer: No Typology Code available for payment source | Admitting: Family Medicine

## 2021-01-10 ENCOUNTER — Other Ambulatory Visit: Payer: Self-pay

## 2021-01-10 ENCOUNTER — Encounter: Payer: Self-pay | Admitting: Family Medicine

## 2021-01-10 VITALS — BP 98/58 | Ht 62.0 in | Wt 106.0 lb

## 2021-01-10 DIAGNOSIS — M25511 Pain in right shoulder: Secondary | ICD-10-CM

## 2021-01-10 NOTE — Patient Instructions (Signed)
Continue with physical therapy - try to schedule appointments out for the next 4 weeks so you don't have to keep making appointments. We will go ahead with an MRI of your right shoulder given your lack of improvement to date with physical therapy, home exercises, the meloxicam. You can take the meloxicam and tylenol if needed. Do home exercises on days you don't go to therapy. Follow up with me after your MRI for a no charge visit to go over results (can be virtual if you prefer).

## 2021-01-10 NOTE — Progress Notes (Signed)
PCP: [name], DO  Subjective:   HPI: Patient is a 35 y.o. female here for right shoulder pain.  1/3: Caroline Jensen is a 35 year old female presenting for evaluation of progressive subacute/chronic right shoulder pain for the past approximate 6 months.  She also endorses some discomfort within her left shoulder, however right is the most concerning.  No preceding injury or trauma.  Throbbing/sore in nature.  Seems to be worse with overhead activities and heavy lifting.  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  Denies any associated muscle weakness, numbness/tingling, popping/locking.  She was diagnosed with R rotator cuff bursitis/impingement in 2016, received injection with resolution of pain until restarting as above.  2/15: Patient reports she's not much better since last visit. Pain still with overhead motions and left shoulder starting to hurt worse more as well. Did not feel like the injection right shoulder provided any benefit. Not taking any medications. Worse at work with lifting patients. + night pain.  3/28: Patient is mildly improved compared to last visit. Left shoulder bothers her more than right now. Has been doing physical therapy and home exercises. Has not tried meloxicam yet. Pain worse with lifting. Worse lying on side also but cannot lie on back due to pain in back when she does this.  5/9: Patient reports she's about the same compared to last visit. Has had a couple additional physical therapy visits and continues to do home exercises. Right shoulder worse than left, lateral pain. Was throbbing on Saturday. Took meloxicam a couple times without notable difference. No night pain.  Past Medical History:  Diagnosis Date  . Atopic dermatitis 09/25/2020  . Breast skin changes 12/12/2016  . Fatigue 02/01/2017  . Genital warts   . Low thyroid stimulating hormone (TSH) level 02/01/2017  . Shoulder pain, bilateral 01/06/2015     Current Outpatient Medications on File Prior to Visit  Medication Sig Dispense Refill  . fludrocortisone (FLORINEF) 0.1 MG tablet Take 1 tablet (0.1 mg total) by mouth daily. 30 tablet 5  . meclizine (ANTIVERT) 25 MG tablet Take 0.5-1 tablets (12.5-25 mg total) by mouth 3 (three) times daily as needed for dizziness. 30 tablet 0  . meloxicam (MOBIC) 7.5 MG tablet Take 7.5 mg by mouth daily.    . Multiple Vitamins-Minerals (MULTI VITAMIN/MINERALS) TABS Bid po    . SUMAtriptan (IMITREX) 50 MG tablet Take 1 tablet (50 mg total) by mouth every 2 (two) hours as needed for migraine. May repeat in 2 hours if headache persists or recurs. 10 tablet 0  . triamcinolone ointment (KENALOG) 0.5 % Apply 1 application topically 2 (two) times daily. For moderate to severe eczema.  Do not use for more than 1 week at a time. 60 g 3   No current facility-administered medications on file prior to visit.    Past Surgical History:  Procedure Laterality Date  . LASER ABLATION OF CONDYLOMAS      No Known Allergies  Social History   Socioeconomic History  . Marital status: Single    Spouse name: Not on file  . Number of children: Not on file  . Years of education: Not on file  . Highest education level: Not on file  Occupational History  . Not on file  Tobacco Use  . Smoking status: Never Smoker  . Smokeless tobacco: Never Used  Vaping Use  . Vaping Use: Never used  Substance and Sexual Activity  . Alcohol use: No  Alcohol/week: 0.0 standard drinks  . Drug use: No  . Sexual activity: Not Currently    Birth control/protection: None, Abstinence  Other Topics Concern  . 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 Drug abuse Father   . Cancer Paternal Aunt        leukemia  . Hypertension Maternal Aunt   . Stroke Other        unknown  . Cirrhosis Maternal Grandmother   . Urticaria Brother   . Eczema Son   . Diabetes Neg Hx   . Heart disease Neg Hx   . Allergic rhinitis Neg Hx   . Asthma Neg Hx     BP (!) 98/58   Ht 5\' 2"  (1.575 m)   Wt 106 lb (48.1 kg)   BMI 19.39 kg/m   [facility] Adult Exercise 09/06/2020  Frequency of aerobic exercise (# of days/week) 0  Average time in minutes 0  Frequency of strengthening activities (# of days/week) 0    No flowsheet data found.  Review of Systems: See HPI above.     Objective:  Physical Exam:  Gen: NAD, comfortable in exam room  Right shoulder: No swelling, ecchymoses.  No gross deformity. No TTP. FROM with painful arc. Positive Hawkins, Neers. Negative Yergasons. Strength 5/5 with empty can and resisted internal/external rotation.  Mild positive empty can. Positive sulcus.  Negative apprehension. NV intact distally.  Left shoulder: No swelling, ecchymoses.  No gross deformity. No TTP. FROM with painful arc. Positive Hawkins, Neers. Negative Yergasons. Strength 5/5 with empty can and resisted internal/external rotation.  Mild positive empty can. Positive sulcus.  Negative apprehension. NV intact distally.   Assessment & Plan:  1. Bilateral shoulder pain - not improving with physical therapy, home exercises, subacromial injection, nsaids.  Will go ahead with MRI.  She can continue with physical therapy, home exercises in the meantime.  F/u after MRI to go over results and next steps.

## 2021-01-14 ENCOUNTER — Ambulatory Visit: Payer: No Typology Code available for payment source | Attending: Family Medicine

## 2021-01-14 ENCOUNTER — Telehealth: Payer: Self-pay

## 2021-01-14 NOTE — Telephone Encounter (Signed)
Called pt and LM about pt's missed visit at 11am. Reminded pt of next appt on 6/1 with Jenn Paa.   Phill Myron. Yvette Rack, PT, DPT

## 2021-01-22 ENCOUNTER — Ambulatory Visit
Admission: RE | Admit: 2021-01-22 | Discharge: 2021-01-22 | Disposition: A | Discharge status: Home / Self Care | Payer: No Typology Code available for payment source | Source: Ambulatory Visit | Attending: Family Medicine | Admitting: Family Medicine

## 2021-01-22 DIAGNOSIS — M25511 Pain in right shoulder: Secondary | ICD-10-CM

## 2021-02-02 ENCOUNTER — Ambulatory Visit: Payer: No Typology Code available for payment source | Attending: Family Medicine | Admitting: Physical Therapy

## 2021-02-03 ENCOUNTER — Encounter: Payer: Self-pay | Admitting: Family Medicine

## 2021-02-14 ENCOUNTER — Ambulatory Visit: Payer: No Typology Code available for payment source | Admitting: Family Medicine

## 2021-02-16 ENCOUNTER — Encounter: Payer: Self-pay | Admitting: Family Medicine

## 2021-02-16 ENCOUNTER — Other Ambulatory Visit: Payer: Self-pay

## 2021-02-16 ENCOUNTER — Ambulatory Visit (INDEPENDENT_AMBULATORY_CARE_PROVIDER_SITE_OTHER): Payer: No Typology Code available for payment source | Admitting: Family Medicine

## 2021-02-16 VITALS — BP 106/62 | Ht 62.0 in | Wt 108.0 lb

## 2021-02-16 DIAGNOSIS — M25511 Pain in right shoulder: Secondary | ICD-10-CM

## 2021-02-16 NOTE — Progress Notes (Signed)
   SUBJECTIVE:  CHIEF COMPLAINT / HPI:   Headaches and Dizziness  Headaches in her frontal area, behind eyes that started on Monday evening. Has hx of headaches. Tried to sleep in off, but not much improvement. Tried taking Meloxicam and meclizine together and had some improvement by 2 am. Headache is similar to typical headache. Nausea is a new symptom. Was able to eat last night - rice, broccoli and chicken. No vomiting.   Dizziness started in march and since has occurred in episodes that come and go for a few days and then go away for a while. No specific pattern to when it returns.  No changes in hearing, tinitus.  Tried florinef, but has not been taking. Wasn't sure if it was helping, but didn't take much.  Patient has been evlatued by neuro with negative MRI for MS, stroke, tumor. Cardiology work up with normal Zio patch and echocardiogram.   PERTINENT  PMH / PSH: hyperthyroidism, palpitations, headaches, eczema, orthostasis  OBJECTIVE:  BP 110/87   Pulse 78   Ht 5\' 2"  (1.575 m)   Wt 109 lb (49.4 kg)   LMP 01/08/2021   SpO2 94%   BMI 19.94 kg/m   General: nontoxic, NAD. Tired appearing  Neuro: CN II-XII intact. No FND. Normal romberg. Normal gait. Strength 5/5 in U/L extremities. Sensation symmetric in BLEE.  Chest: RRR Lungs: CTAB    ASSESSMENT/PLAN:  Intractable episodic headache Patient's headache responded slightly to meloxicam and meclizine together.  This headache is similar to previous headaches.  She reports not trying the sumatriptan that Dr. Higinio Plan sent over.  I have recommended trying this medication.  To see if there is any improvement as her symptoms do sound more like a migraine headache rather than tension or cluster.    Dizziness Patient has had work-up for this by neuro and cardiology.  MRI negative for MS, stroke, tumor.  Cardiology work-up normal with Zio patch and echocardiogram.  We will send patient referral to ENT for further evaluation and possible  vestibular rehab.  Continue meclizine as needed for dizziness.  Wilber Oliphant, MD [place]

## 2021-02-16 NOTE — Progress Notes (Signed)
Patient returns today to go over her right shoulder MRI.  This test was reassuring - some signal in acromion and deltoid but very mild.  Her pain persists and has for about a year now including 5-6 months of conservative treatment with physical therapy, home exercises, subacromial injection.  Did not rx nitro patches because of her migraine history.  She has + sulcus sign, hawkins, and neers consistent with impingement and instability.  Beighton score 0/9.  Believe at this point she should consult with shoulder surgeon to discuss possible arthroscopy and capsulorrhaphy.  Can continue exercises in meantime.

## 2021-02-16 NOTE — Patient Instructions (Addendum)
We will refer you to one of the shoulder specialists to talk about consideration of capsular tightening, arthroscopic debridement of the shoulder given you've done extensive conservative treatment and are still struggling with this shoulder pain.  Dr. [facility] [address] [place]  Appt: 02/18/21 @ 8:45 am, please arrive at 8:30 am

## 2021-02-17 ENCOUNTER — Encounter: Payer: Self-pay | Admitting: Family Medicine

## 2021-02-17 ENCOUNTER — Ambulatory Visit (INDEPENDENT_AMBULATORY_CARE_PROVIDER_SITE_OTHER): Payer: No Typology Code available for payment source | Admitting: Family Medicine

## 2021-02-17 VITALS — BP 110/87 | HR 78 | Ht 62.0 in | Wt 109.0 lb

## 2021-02-17 DIAGNOSIS — R519 Headache, unspecified: Secondary | ICD-10-CM

## 2021-02-17 DIAGNOSIS — R42 Dizziness and giddiness: Secondary | ICD-10-CM | POA: Insufficient documentation

## 2021-02-17 MED ORDER — SUMATRIPTAN SUCCINATE 50 MG PO TABS: 50.0000 mg | ORAL_TABLET | ORAL | 0 refills | Status: DC | PRN

## 2021-02-17 MED ORDER — BLOOD GLUCOSE MONITOR KIT: PACK | 0 refills | Status: DC

## 2021-02-17 NOTE — Assessment & Plan Note (Signed)
Patient's headache responded slightly to meloxicam and meclizine together.  This headache is similar to previous headaches.  She reports not trying the sumatriptan that Dr. Higinio Plan sent over.  I have recommended trying this medication.  To see if there is any improvement as her symptoms do sound more like a migraine headache rather than tension or cluster.

## 2021-02-17 NOTE — Assessment & Plan Note (Signed)
Patient has had work-up for this by neuro and cardiology.  MRI negative for MS, stroke, tumor.  Cardiology work-up normal with Zio patch and echocardiogram.  We will send patient referral to ENT for further evaluation and possible vestibular rehab.  Continue meclizine as needed for dizziness.

## 2021-02-17 NOTE — Patient Instructions (Addendum)
Dear Caroline Jensen,   Today we discussed the following:   Dizzinesss  Sending referral to ENT  Follow up with your PCP as needed   If you are getting sweaty with the episodes of dizziness, try taking your blood sugar.   Headache Try the sumatriptan  If you are taking meloxicam, take with food.   For any labs obtained today, I will send results via [place].  If anything is abnormal, we will reach out to you for details on further management.   Be well,  Dr. Maudie Mercury  =================================================================================== Some important items for Caroline Jensen's health:   Please schedule an appointment with Dr. Higinio Plan, Ralph Leyden, DO to address the following or for any questions or concerns regarding the information below.   To keep you healthy, you are due for the following Health Maintenance Items:  Health Maintenance Due  Topic Date Due   COVID-19 Vaccine (3 - Booster for [place] series) 09/12/2020

## 2021-02-18 DIAGNOSIS — M25512 Pain in left shoulder: Secondary | ICD-10-CM | POA: Diagnosis not present

## 2021-02-18 DIAGNOSIS — M25511 Pain in right shoulder: Secondary | ICD-10-CM | POA: Diagnosis not present

## 2021-02-19 NOTE — Progress Notes (Deleted)
Cardiology Office Note:    Date:  02/19/2021   ID:  Caroline Jensen, DOB [birth year], MRN [id number]  PCP:  [name], DO  Cardiologist:  None  Electrophysiologist:  None   Referring MD: [name], DO   No chief complaint on file.   History of Present Illness:    Caroline Jensen is a 35 y.o. female with a hx of allergic rhinitis, subclinical hypothyroidism who presents for follow-up.  She was seen in urgent care on 11/16/2020 with dizziness.  Thought to be dehydrated, given IV fluids and reported some improvement.  She was referred to neurology and cardiology for further evaluation.  She previously was seen by Dr. Illene Bolus for evaluation of palpitations in 2019.  30-day monitor showed no significant arrhythmias.  Seen by neurology 3/22, had orthostatics which showed no change in BP with position, but 15 bpm increase in heart rate with standing.  MRI brain was ordered.  She was prescribed compression stockings and Florinef.  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  She reports she had been standing and felt like she had to use the bathroom and after urinating stood up and felt like was going to pass out.  She went to the nursing station and lost feeling in her arms and legs.  Reports heart rate was in the 40s.  She had another episode on 3/15.  She felt dizzy and stood up and felt like she was going to pass out.  Lost feeling in her legs.  She went to urgent care at that time.  She does report she has been having palpitations a couple times per week, lasting for few minutes.  Feels like heart is racing during episodes, has noted heart rate up to 160s.  She denies any shortness of breath but reports occasional chest pain.  Describes as discomfort on the right side of her chest, occurs at rest, can last for few minutes.  She does not exercise.  No smoking history.  No history of heart disease in her  immediate family.  Zio patch x14 days on 12/30/2020 showed no significant abnormalities.  Echocardiogram on 01/03/2021 showed normal biventricular function, no significant valvular disease.  Since last clinic visit,    Past Medical History:  Diagnosis Date   Atopic dermatitis 09/25/2020   Breast skin changes 12/12/2016   Fatigue 02/01/2017   Genital warts    Low thyroid stimulating hormone (TSH) level 02/01/2017   Palpitations 07/18/2017   Shoulder pain, bilateral 01/06/2015    Past Surgical History:  Procedure Laterality Date   LASER ABLATION OF CONDYLOMAS      Current Medications: No outpatient medications have been marked as taking for the 02/21/21 encounter (Appointment) with Donato Heinz, MD.     Allergies:   Patient has no known allergies.   Social History   Socioeconomic History   Marital status: Single    Spouse name: Not on file   Number of children: Not on file   Years of education: Not on file   Highest education level: Not on file  Occupational History   Not on file  Tobacco Use   Smoking status: Never   Smokeless tobacco: Never  Vaping Use   Vaping Use: Never used  Substance and Sexual Activity   Alcohol use: No    Alcohol/week: 0.0 standard drinks   Drug use: No   Sexual activity: Not Currently  Birth control/protection: None, Abstinence  Other Topics Concern   Not on file  Social History Narrative   Lives in [place] with 3 kids (ages 80, 54, 42)   Oceanographer, 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 There is no history of Diabetes, Heart disease, Allergic rhinitis, or Asthma.  ROS:   Please see the history of present illness.     All other systems reviewed and are negative.  EKGs/Labs/Other Studies Reviewed:    The following studies were reviewed today:   EKG:  EKG is ordered today.  The ekg ordered today demonstrates sinus bradycardia, rate 54, no ST sbnormalities, QTc 394  Recent Labs: 02/21/2020: ALT 11 11/04/2020: BUN 8; Creatinine, Ser 0.95; Hemoglobin 12.0; Platelets 216; Potassium 3.7; Sodium 137 11/09/2020: TSH 0.348  Recent Lipid Panel No results found for: CHOL, TRIG, HDL, CHOLHDL, VLDL, LDLCALC, LDLDIRECT  Physical Exam:    VS:  There were no vitals taken for this visit.    Wt Readings from Last 3 Encounters:  02/17/21 109 lb (49.4 kg)  02/16/21 108 lb (49 kg)  01/10/21 106 lb (48.1 kg)     GEN: Well nourished, well developed in no acute distress HEENT: Normal NECK: No JVD; No carotid bruits LYMPHATICS: No lymphadenopathy CARDIAC: RRR, no murmurs, rubs, gallops RESPIRATORY:  Clear to auscultation without rales, wheezing or rhonchi  ABDOMEN: Soft, non-tender, non-distended MUSCULOSKELETAL:  No edema; No deformity  SKIN: Warm and dry NEUROLOGIC:  Alert and oriented x 3 PSYCHIATRIC:  Normal affect   ASSESSMENT:    No diagnosis found.  PLAN:    Palpitations/near syncope/lightheadedness: Zio patch x14 days on 12/30/2020 showed no significant abnormalities.  Echocardiogram on 01/03/2021 showed normal biventricular function, no significant valvular disease.  RTC in ***   Medication Adjustments/Labs and Tests Ordered: Current medicines are reviewed at length with the patient today.  Concerns regarding medicines are outlined above.  No orders of the defined types were placed in this encounter.  No orders of the defined types were placed in this encounter.   There are no Patient Instructions on file for this  visit.   Signed, Donato Heinz, MD  02/19/2021 2:20 PM    [facility]

## 2021-02-21 ENCOUNTER — Ambulatory Visit (INDEPENDENT_AMBULATORY_CARE_PROVIDER_SITE_OTHER): Payer: No Typology Code available for payment source | Admitting: Cardiology

## 2021-02-21 ENCOUNTER — Encounter: Payer: Self-pay | Admitting: Cardiology

## 2021-02-21 ENCOUNTER — Other Ambulatory Visit: Payer: Self-pay

## 2021-02-21 VITALS — BP 94/63 | HR 65 | Ht 62.0 in | Wt 111.4 lb

## 2021-02-21 DIAGNOSIS — R197 Diarrhea, unspecified: Secondary | ICD-10-CM

## 2021-02-21 DIAGNOSIS — R42 Dizziness and giddiness: Secondary | ICD-10-CM | POA: Diagnosis not present

## 2021-02-21 DIAGNOSIS — R0600 Dyspnea, unspecified: Secondary | ICD-10-CM

## 2021-02-21 DIAGNOSIS — R0609 Other forms of dyspnea: Secondary | ICD-10-CM

## 2021-02-21 LAB — BASIC METABOLIC PANEL
BUN/Creatinine Ratio: 25 — ABNORMAL HIGH (ref 9–23)
BUN: 17 mg/dL (ref 6–20)
CO2: 24 mmol/L (ref 20–29)
Calcium: 9.2 mg/dL (ref 8.7–10.2)
Chloride: 107 mmol/L — ABNORMAL HIGH (ref 96–106)
Creatinine, Ser: 0.68 mg/dL (ref 0.57–1.00)
Glucose: 55 mg/dL — ABNORMAL LOW (ref 65–99)
Potassium: 4.2 mmol/L (ref 3.5–5.2)
Sodium: 142 mmol/L (ref 134–144)
eGFR: 117 mL/min/{1.73_m2} (ref >59)

## 2021-02-21 NOTE — Patient Instructions (Signed)
Medication Instructions:  Your physician recommends that you continue on your current medications as directed. Please refer to the Current Medication list given to you today.  *If you need a refill on your cardiac medications before your next appointment, please call your pharmacy*   Lab Work: BMET today  If you have labs (blood work) drawn today and your tests are completely normal, you will receive your results only by: [place] (if you have MyChart) OR A paper copy in the mail If you have any lab test that is abnormal or we need to change your treatment, we will call you to review the results.   Testing/Procedures: Your physician has requested that you have an exercise tolerance test. For further information please visit [email or web address]. Please also follow instruction sheet, as given.  Follow-Up: At [facility], you and your health needs are our priority.  As part of our continuing mission to provide you with exceptional heart care, we have created designated Provider Care Teams.  These Care Teams include your primary Cardiologist (physician) and Advanced Practice Providers (APPs -  Physician Assistants and Nurse Practitioners) who all work together to provide you with the care you need, when you need it.  We recommend signing up for the patient portal called "MyChart".  Sign up information is provided on this After Visit Summary.  MyChart is used to connect with patients for Virtual Visits (Telemedicine).  Patients are able to view lab/test results, encounter notes, upcoming appointments, etc.  Non-urgent messages can be sent to your provider as well.   To learn more about what you can do with MyChart, go to [email or web address].    Your next appointment:   6 month(s)  The format for your next appointment:   In Person  Provider:   Oswaldo Milian, MD

## 2021-02-21 NOTE — Progress Notes (Signed)
Cardiology Office Note:    Date:  02/21/2021   ID:  Caroline Jensen, DOB [birth year], MRN [id number]  PCP:  Caroline Clan, DO  Cardiologist:  None  Electrophysiologist:  None   Referring MD: Caroline Clan, DO   Chief Complaint  Patient presents with   Dizziness     History of Present Illness:    Caroline Jensen is a 35 y.o. female with a hx of allergic rhinitis, subclinical hypothyroidism who presents for follow-up.  She was seen in urgent care on 11/16/2020 with dizziness.  Thought to be dehydrated, given IV fluids and reported some improvement.  She was referred to neurology and cardiology for further evaluation.  She previously was seen by Dr. Illene Jensen for evaluation of palpitations in 2019.  30-day monitor showed no significant arrhythmias.  Seen by neurology 3/22, had orthostatics which showed no change in BP with position, but 15 bpm increase in heart rate with standing.  MRI brain was ordered.  She was prescribed compression stockings and Florinef.  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  She reports she had been standing and felt like she had to use the bathroom and after urinating stood up and felt like was going to pass out.  She went to the nursing station and lost feeling in her arms and legs.  Reports heart rate was in the 40s.  She had another episode on 3/15.  She felt dizzy and stood up and felt like she was going to pass out.  Lost feeling in her legs.  She went to urgent care at that time.  She does report she has been having palpitations a couple times per week, lasting for few minutes.  Feels like heart is racing during episodes, has noted heart rate up to 160s.  She denies any shortness of breath but reports occasional chest pain.  Describes as discomfort on the right side of her chest, occurs at rest, can last for few minutes.  She does not exercise.  No smoking history.  No  history of heart disease in her immediate family.  Zio patch x14 days on 12/30/2020 showed no significant abnormalities.  Echocardiogram on 01/03/2021 showed normal biventricular function, no significant valvular disease.  Since last clinic visit, she is feeling okay aside from continuing to have headaches, lightheadedness, and minor weakness. Her lightheadedness was worse last week, and this limited her ability to work. It is typically a sudden onset, and she may be sitting or standing. The lightheadedness may correlate with her migraines but this is not always the case. She can still notice rare palpitations but notes they are not really bothersome. She states she can feel when her heart rate is elevated and when it is dropping. There have not been any more syncopal episodes. She endorses occasional mild LE edema localized in her feet. Of note, yesterday she felt very fatigued after only a few minutes of playing with her daughter, which is abnormal for her.  Has noted lightheadedness and dyspnea seem to correlate with exertion.  She denies any chest pain, orthopnea or PND.   Past Medical History:  Diagnosis Date   Atopic dermatitis 09/25/2020   Breast skin changes 12/12/2016   Fatigue 02/01/2017   Genital warts    Low thyroid stimulating hormone (TSH) level 02/01/2017   Palpitations 07/18/2017   Shoulder pain, bilateral 01/06/2015    Past Surgical History:  Procedure Laterality  Date   LASER ABLATION OF CONDYLOMAS      Current Medications: Current Meds  Medication Sig   blood glucose meter kit and supplies KIT Dispense based on patient and insurance preference. Use up to four times daily as directed.   fludrocortisone (FLORINEF) 0.1 MG tablet Take 1 tablet (0.1 mg total) by mouth daily.   meclizine (ANTIVERT) 25 MG tablet Take 0.5-1 tablets (12.5-25 mg total) by mouth 3 (three) times daily as needed for dizziness.   meloxicam (MOBIC) 7.5 MG tablet Take 7.5 mg by mouth daily.   Multiple  Vitamins-Minerals (MULTI VITAMIN/MINERALS) TABS Bid po   SUMAtriptan (IMITREX) 50 MG tablet Take 1 tablet (50 mg total) by mouth every 2 (two) hours as needed for migraine. May repeat in 2 hours if headache persists or recurs.   triamcinolone ointment (KENALOG) 0.5 % Apply 1 application topically 2 (two) times daily. For moderate to severe eczema.  Do not use for more than 1 week at a time.     Allergies:   Patient has no known allergies.   Social History   Socioeconomic History   Marital status: Single    Spouse name: Not on file   Number of children: Not on file   Years of education: Not on file   Highest education level: Not on file  Occupational History   Not on file  Tobacco Use   Smoking status: Never   Smokeless tobacco: Never  Vaping Use   Vaping Use: Never used  Substance and Sexual Activity   Alcohol use: No    Alcohol/week: 0.0 standard drinks   Drug use: No   Sexual activity: Not Currently    Birth control/protection: None, Abstinence  Other Topics Concern   Not on file  Social History Narrative   Lives in [place] with 3 kids (ages 34, 52, 41)   Oceanographer, 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 There is no history of Diabetes, Heart disease, Allergic rhinitis, or Asthma.  ROS:   Please see the history of present illness.    (+) Headaches, Migraines (+) Lightheadedness (+) Generalized weakness (+) Bilateral LE edema localized to feet (+) Palpitations (+) Fatigue All other  systems reviewed and are negative.  EKGs/Labs/Other Studies Reviewed:    The following studies were reviewed today:  Echo 01/03/2021: 1. Left ventricular ejection fraction, by estimation, is 60 to 65%. The  left ventricle has normal function. The left ventricle has no regional  wall motion abnormalities. Left ventricular diastolic parameters were  normal.   2. Right ventricular systolic function is normal. The right ventricular  size is normal. There is normal pulmonary artery systolic pressure.   3. The mitral valve is normal in structure. No evidence of mitral valve  regurgitation. No evidence of mitral stenosis.   4. The aortic valve is tricuspid. Aortic valve regurgitation is not  visualized. No aortic stenosis is present.   5. The inferior vena cava is normal in size with greater than 50%  respiratory variability,  suggesting right atrial pressure of 3 mmHg.  Monitor 12/30/2020: No significant abnormalities  Patch Wear Time:  14 days and 0 hours (2022-03-31T16:30:43-0400 to 2022-04-14T16:30:47-0400)   Patient had a min HR of 43 bpm, max HR of 147 bpm, and avg HR of 73 bpm. Predominant underlying rhythm was Sinus Rhythm. Isolated SVEs were rare (<1.0%), and no SVE Couplets or SVE Triplets were present. Isolated VEs were rare (<1.0%), and no VE Couplets  or VE Triplets were present.  2 patient triggered events, corresponding to sinus rhythm  EKG:   02/21/2021: EKG is not ordered today. 11/30/2020: sinus bradycardia, rate 54, no ST sbnormalities, QTc 394  Recent Labs: 11/04/2020: BUN 8; Creatinine, Ser 0.95; Hemoglobin 12.0; Platelets 216; Potassium 3.7; Sodium 137 11/09/2020: TSH 0.348  Recent Lipid Panel No results found for: CHOL, TRIG, HDL, CHOLHDL, VLDL, LDLCALC, LDLDIRECT  Physical Exam:    VS:  BP 94/63   Pulse 65   Ht _0  (1.575 m)   Wt 111 lb 6.4 oz (50.5 kg)   SpO2 100%   BMI 20.38 kg/m     Wt Readings from Last 3 Encounters:  02/21/21 111 lb 6.4 oz (50.5 kg)   02/17/21 109 lb (49.4 kg)  02/16/21 108 lb (49 kg)     GEN: Well nourished, well developed in no acute distress HEENT: Normal NECK: No JVD; No carotid bruits LYMPHATICS: No lymphadenopathy CARDIAC: RRR, no murmurs, rubs, gallops RESPIRATORY:  Clear to auscultation without rales, wheezing or rhonchi  ABDOMEN: Soft, non-tender, non-distended MUSCULOSKELETAL:  No edema; No deformity  SKIN: Warm and dry NEUROLOGIC:  Alert and oriented x 3 PSYCHIATRIC:  Normal affect   ASSESSMENT:    1. DOE (dyspnea on exertion)   2. Lightheadedness   3. Diarrhea, unspecified type     PLAN:     Palpitations/near syncope/lightheadedness/dyspnea: Zio patch x14 days on 12/30/2020 showed no significant abnormalities.  Echocardiogram on 01/03/2021 showed normal biventricular function, no significant valvular disease.  Normal orthostatics in clinic today.  She reports having dyspnea and lightheadedness with exertion, will check ETT.  Diarrhea multiple episodes of diarrhea recently.  Will check exam BMP to evaluate electrolytes and recommend PCP follow-up  RTC in 6 months  Shared Decision Making/Informed Consent The risks [chest pain, shortness of breath, cardiac arrhythmias, dizziness, blood pressure fluctuations, myocardial infarction, stroke/transient ischemic attack, and life-threatening complications (estimated to be 1 in 10,000)], benefits (risk stratification, diagnosing coronary artery disease, treatment guidance) and alternatives of an exercise tolerance test were discussed in detail with Ms. Clemson and she agrees to proceed.    Medication Adjustments/Labs and Tests Ordered: Current medicines are reviewed at length with the patient today.  Concerns regarding medicines are outlined above.  Orders Placed This Encounter  Procedures   Basic metabolic panel   Cardiac Stress Test: Informed Consent Details: Physician/Practitioner Attestation; Transcribe to consent form and obtain patient signature    EXERCISE TOLERANCE TEST (ETT)    No orders of the defined types were placed in this encounter.   Patient Instructions  Medication Instructions:  Your physician recommends that you continue on your current medications as directed. Please refer to the Current Medication list given to you today.  *If you need a refill on your cardiac medications before your next appointment, please call your pharmacy*   Lab Work: BMET today  If you have labs (blood work) drawn today and your tests are completely normal, you will receive your results only by: [place] (if you have MyChart) OR A  paper copy in the mail If you have any lab test that is abnormal or we need to change your treatment, we will call you to review the results.   Testing/Procedures: Your physician has requested that you have an exercise tolerance test. For further information please visit [email or web address]. Please also follow instruction sheet, as given.  Follow-Up: At [facility], you and your health needs are our priority.  As part of our continuing mission to provide you with exceptional heart care, we have created designated Provider Care Teams.  These Care Teams include your primary Cardiologist (physician) and Advanced Practice Providers (APPs -  Physician Assistants and Nurse Practitioners) who all work together to provide you with the care you need, when you need it.  We recommend signing up for the patient portal called "MyChart".  Sign up information is provided on this After Visit Summary.  MyChart is used to connect with patients for Virtual Visits (Telemedicine).  Patients are able to view lab/test results, encounter notes, upcoming appointments, etc.  Non-urgent messages can be sent to your provider as well.   To learn more about what you can do with MyChart, go to [email or web address].    Your next appointment:   6 month(s)  The format for your next appointment:   In Person  Provider:    Oswaldo Milian, MD      [facility] Stumpf,acting as a scribe for Donato Heinz, MD.,have documented all relevant documentation on the behalf of Donato Heinz, MD,as directed by  Donato Heinz, MD while in the presence of Donato Heinz, MD.  I, Donato Heinz, MD, have reviewed all documentation for this visit. The documentation on 02/21/21 for the exam, diagnosis, procedures, and orders are all accurate and complete.   Signed, Donato Heinz, MD  02/21/2021 12:14 PM    [facility]

## 2021-02-22 ENCOUNTER — Encounter: Payer: Self-pay | Admitting: Family Medicine

## 2021-02-22 ENCOUNTER — Ambulatory Visit (HOSPITAL_COMMUNITY)
Admission: RE | Admit: 2021-02-22 | Discharge: 2021-02-22 | Disposition: A | Discharge status: Home / Self Care | Payer: No Typology Code available for payment source | Source: Ambulatory Visit | Attending: Internal Medicine | Admitting: Internal Medicine

## 2021-02-22 ENCOUNTER — Ambulatory Visit (INDEPENDENT_AMBULATORY_CARE_PROVIDER_SITE_OTHER): Payer: No Typology Code available for payment source | Admitting: Family Medicine

## 2021-02-22 VITALS — BP 104/68 | HR 64 | Ht 62.0 in | Wt 112.8 lb

## 2021-02-22 DIAGNOSIS — R0600 Dyspnea, unspecified: Secondary | ICD-10-CM

## 2021-02-22 DIAGNOSIS — I959 Hypotension, unspecified: Secondary | ICD-10-CM

## 2021-02-22 DIAGNOSIS — R0609 Other forms of dyspnea: Secondary | ICD-10-CM

## 2021-02-22 DIAGNOSIS — R55 Syncope and collapse: Secondary | ICD-10-CM

## 2021-02-22 DIAGNOSIS — E162 Hypoglycemia, unspecified: Secondary | ICD-10-CM | POA: Diagnosis not present

## 2021-02-22 DIAGNOSIS — R42 Dizziness and giddiness: Secondary | ICD-10-CM

## 2021-02-22 NOTE — Patient Instructions (Signed)
Below is our plan:  We will continue to monitor symptoms. Reschedule exercise tolerance test. Please call pharmacy regarding glucose monitor. Follow up closely with PCP. Continue meclizine as needed. Continue compression stockings.   Please make sure you are staying well hydrated. I recommend 50-60 ounces daily. Well balanced diet and regular exercise encouraged. Consistent sleep schedule with 6-8 hours recommended.   Please continue follow up with care team as directed.   Follow up with me in 8-12 weeks   You may receive a survey regarding today's visit. I encourage you to leave honest feed back as I do use this information to improve patient care. Thank you for seeing me today!

## 2021-02-22 NOTE — Progress Notes (Signed)
Chief Complaint  Patient presents with   Follow-up    RM 1, alone. Pt has been having bad migraines with dizziness, nausea, and softer stools, onset this past Monday evening til this Saturday.      HISTORY OF PRESENT ILLNESS: 02/22/21 ALL:  Caroline Jensen is a 35 y.o. female here today for follow up for pre syncope thought to be related to hypotension. She was started on florinef 0.5m daily at consult with Dr DBrett Fairy3/2022. MRI was unremarkable. She does not feel it helped much at all. She has not taken recently. Recently, she has had dizziness, headaches, fatigue, shob, nausea, and soft stools. No syncopal episodes. She reports having an episode last week while playing with her daughter where she became really tired, sweaty, dizzy and felt "sick".   She is also followed by cardiology. Recent Ziopatch x 14 days and Echo were unremarkable. She expressed some concerns about shortness of breath while playing with her daughter that is abnormal for her. Exercise tolerance test was scheduled today but she worked last night and did not feel she could wait for 9:45 appt. She has rescheduled for next week. She feels that feeling lightheaded and dyspnea seem to correlate with exertion. She continues to have intermittent palpitations but not overly bothered by these. Heart rate fluctuates and can be as low as 40's or as high as 160's per patient. She also reported to cardiology that she had recent diarrhea. BMP unremarkable with exception of low glucose (55). She admits that she frequently skips meals. She eats mostly carbohydrates. 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 PCP has ordered glucometer but she has not picked up from pharmacy.    HISTORY (copied from Dr Dohmeier's previous note)  Caroline Jensen a 89- year- old African American female patient was seen here upon referral on 11/23/2020 from *Dr. BHiginio Plan DO, and KLavell Anchors FNP.     Chief concern according to patient : "The patient lost awareness on the 11-04-2020, and she collapsed forward, her fall was broken by a cMudlogger  She had spells while going to the bathroom, and while being seated or standing . Legs buckle. " She was given fluids each time, 2 spells .  Was told she was dehydrated.    Caroline Jensen has a past medical history of Atopic dermatitis (09/25/2020), Breast skin changes (12/12/2016), Fatigue (02/01/2017), headaches,  Low thyroid stimulating hormone (TSH) level (02/01/2017), and Shoulder pain, bilateral (01/06/2015)., spells of dizziness with vertigo. LOC.    The patient currently works in labor and delivery - [employer]    Tobacco use none .  ETOH use;none , Caffeine intake in form of Soda( not daily ) .   REVIEW OF SYSTEMS: Out of a complete 14 system review of symptoms, the patient complains only of the following symptoms, and all other reviewed systems are negative.    ALLERGIES: No Known Allergies   HOME MEDICATIONS: Outpatient Medications Prior to Visit  Medication Sig Dispense Refill   blood glucose meter kit and supplies KIT Dispense based on patient and insurance preference. Use up to four times daily as directed. 1 each 0   meclizine (ANTIVERT) 25 MG tablet Take 0.5-1 tablets (12.5-25 mg total) by mouth 3 (three) times daily as needed for dizziness. 30 tablet 0   meloxicam (MOBIC) 7.5 MG tablet Take 7.5 mg by mouth daily.     Multiple Vitamins-Minerals (MULTI VITAMIN/MINERALS) TABS  Bid po     SUMAtriptan (IMITREX) 50 MG tablet Take 1 tablet (50 mg total) by mouth every 2 (two) hours as needed for migraine. May repeat in 2 hours if headache persists or recurs. 10 tablet 0   triamcinolone ointment (KENALOG) 0.5 % Apply 1 application topically 2 (two) times daily. For moderate to severe eczema.  Do not use for more than 1 week at a time. 60 g 3   fludrocortisone (FLORINEF) 0.1 MG tablet Take 1 tablet (0.1 mg total) by mouth daily. 30 tablet 5    No facility-administered medications prior to visit.     PAST MEDICAL HISTORY: Past Medical History:  Diagnosis Date   Atopic dermatitis 09/25/2020   Breast skin changes 12/12/2016   Fatigue 02/01/2017   Genital warts    Low thyroid stimulating hormone (TSH) level 02/01/2017   Palpitations 07/18/2017   Shoulder pain, bilateral 01/06/2015     PAST SURGICAL HISTORY: Past Surgical History:  Procedure Laterality Date   LASER ABLATION OF CONDYLOMAS       FAMILY HISTORY: Family History  Problem Relation Age of Onset   Drug abuse Father    Cancer Paternal Aunt        leukemia   Hypertension Maternal Aunt    Stroke Other        unknown   Cirrhosis Maternal Grandmother    Urticaria Brother    Eczema Son    Diabetes Neg Hx    Heart disease Neg Hx    Allergic rhinitis Neg Hx    Asthma Neg Hx      SOCIAL HISTORY: Social History   Socioeconomic History   Marital status: Single    Spouse name: Not on file   Number of children: Not on file   Years of education: Not on file   Highest education level: Not on file  Occupational History   Not on file  Tobacco Use   Smoking status: Never   Smokeless tobacco: Never  Vaping Use   Vaping Use: Never used  Substance and Sexual Activity   Alcohol use: No    Alcohol/week: 0.0 standard drinks   Drug use: No   Sexual activity: Not Currently    Birth control/protection: None, Abstinence  Other Topics Concern   Not on file  Social History Narrative   Lives in [place] with 3 kids (ages 29, 16, 84)   Oceanographer, 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   Generalized: Well developed, in no acute distress  Cardiology: normal rate and rhythm, no murmur auscultated  Respiratory: clear to auscultation bilaterally    Neurological examination  Mentation: Alert oriented to time, place, history taking. Follows all commands speech and language fluent Cranial nerve II-XII: Pupils were equal round reactive to light. Extraocular movements were full, visual field were full on confrontational test. Facial sensation and strength were normal. Uvula tongue midline. Head turning and shoulder shrug  were normal and symmetric. Motor: The motor testing reveals 5  over 5 strength of all 4 extremities. Good symmetric motor tone is noted throughout.  Sensory: Sensory testing is intact to soft touch on all 4 extremities. No evidence of extinction is noted.  Coordination: Cerebellar testing reveals good finger-nose-finger and heel-to-shin bilaterally.  Gait and station: Gait is normal. Tandem gait is normal. Romberg is negative. No drift is seen.  Reflexes: Deep tendon reflexes are symmetric and normal bilaterally.     DIAGNOSTIC DATA (LABS, IMAGING, TESTING) - I reviewed patient records, labs, notes, testing and imaging myself where available.  Lab Results  Component Value Date   WBC 4.2 11/04/2020   HGB 12.0 11/04/2020   HCT 34.2 (L) 11/04/2020   MCV 91.4 11/04/2020   PLT 216 11/04/2020      Component Value Date/Time   NA 142 02/21/2021 1118   K 4.2 02/21/2021 1118   CL 107 (H) 02/21/2021 1118   CO2 24 02/21/2021 1118   GLUCOSE 55 (L) 02/21/2021 1118   GLUCOSE 83 11/04/2020 0215   BUN 17 02/21/2021 1118   CREATININE 0.68 02/21/2021 1118   CALCIUM 9.2 02/21/2021 1118   PROT 7.2 02/21/2020 2028   PROT 6.6 12/07/2016 1625   ALBUMIN 4.2 02/21/2020 2028   ALBUMIN 4.1 12/07/2016 1625   AST 17 02/21/2020 2028   ALT 11 02/21/2020 2028   ALKPHOS 41 02/21/2020 2028   BILITOT 0.6 02/21/2020  2028   BILITOT 0.3 12/07/2016 1625   GFRNONAA >60 11/04/2020 0215   GFRAA >60 02/21/2020 2028   No results found for: CHOL, HDL, LDLCALC, LDLDIRECT, TRIG, CHOLHDL No results found for: HGBA1C No results found for: VITAMINB12 Lab Results  Component Value Date   TSH 0.348 (L) 11/09/2020    No flowsheet data found.   No flowsheet data found.   ASSESSMENT AND PLAN  35 y.o. year old female  has a past medical history of Atopic dermatitis (09/25/2020), Breast skin changes (12/12/2016), Fatigue (02/01/2017), Genital warts, Low thyroid stimulating hormone (TSH) level (02/01/2017), Palpitations (07/18/2017), and Shoulder pain, bilateral (01/06/2015). here with     Pre-syncope  Hypotension, unspecified hypotension type  Hypoglycemia  Caroline Jensen did not notice any significant benefit with taking florinef. We will discontinue for now. I am concerned about multiple factors contributing to symptoms. She will proceed with ETT next week. She will monitor blood sugars as directed with PCP. We have discussed healthy lifestyle habits with regular, well balanced meals and less simple carbohydrates. She will continue to monitor blood pressures. She will continue wearing compression stockings and increase water intake. She will continue meclizine for vertiginous symptoms. I will have her follow up with me in 8-12 weeks for reevaluation. She may call sooner with worsening symptoms. She verbalizes understanding and agreement with this plan.   No orders of the defined types were placed in this encounter.    No orders of the defined types were placed in this encounter.     Debbora Presto, MSN, FNP-C 02/22/2021, 4:29 PM  [facility] [address], [place], [place] [address] [phone]

## 2021-02-25 ENCOUNTER — Telehealth (HOSPITAL_COMMUNITY): Payer: Self-pay | Admitting: *Deleted

## 2021-02-25 NOTE — Telephone Encounter (Signed)
Close encounter 

## 2021-03-01 ENCOUNTER — Ambulatory Visit (HOSPITAL_COMMUNITY)
Admission: RE | Admit: 2021-03-01 | Discharge: 2021-03-01 | Disposition: A | Discharge status: Home / Self Care | Payer: No Typology Code available for payment source | Source: Ambulatory Visit | Attending: Internal Medicine | Admitting: Internal Medicine

## 2021-03-01 DIAGNOSIS — R42 Dizziness and giddiness: Secondary | ICD-10-CM

## 2021-03-01 DIAGNOSIS — R0600 Dyspnea, unspecified: Secondary | ICD-10-CM | POA: Diagnosis not present

## 2021-03-01 LAB — EXERCISE TOLERANCE TEST
Estimated workload: 10.1 METS
Exercise duration (min): 8 min
Exercise duration (sec): 1 s
MPHR: 186 {beats}/min
Peak HR: 131 {beats}/min
Percent HR: 70 %
Rest HR: 66 {beats}/min

## 2021-03-21 ENCOUNTER — Encounter: Payer: Self-pay | Admitting: Student

## 2021-03-30 ENCOUNTER — Telehealth (INDEPENDENT_AMBULATORY_CARE_PROVIDER_SITE_OTHER): Payer: No Typology Code available for payment source | Admitting: Family Medicine

## 2021-03-30 ENCOUNTER — Other Ambulatory Visit: Payer: Self-pay

## 2021-03-30 DIAGNOSIS — R42 Dizziness and giddiness: Secondary | ICD-10-CM | POA: Diagnosis not present

## 2021-03-30 NOTE — Progress Notes (Signed)
[place] Telemedicine Visit  Patient consented to have virtual visit and was identified by name and date of birth. Method of visit: Telephone  Encounter participants: Patient: Caroline Jensen - located at Home Provider: Rise Patience - located off [facility] campus  Chief Complaint: Needs letter for FMLA  HPI:  Was discussed with office staff and clinic director Dr. Gwendlyn Deutscher. Patient requesting to have letter for [facility] paperwork that states that she is working 12 hour shifts instead of the 8 hour shifts that are listed on her previously completed FMLA forms. No other specifications regarding the letter given.   ROS: per HPI  Pertinent PMHx: Reviewed  Exam:  There were no vitals taken for this visit.  Respiratory: speaking in full sentences without difficulty, pleasant, able to participate in discussion fully.  Assessment/Plan:  FMLA paperwork Letter regarding updated hours for patient's FMLA forms was sent via [place]. Patient was called and HIPPA compliant message was left. If patient needs the letter printed by our office she is instructed to let our office know and we can do so for her.    Time spent during visit with patient: 6 minutes

## 2021-04-21 NOTE — Progress Notes (Deleted)
No chief complaint on file.    HISTORY OF PRESENT ILLNESS: 04/21/21 ALL:  Caroline Jensen returns for follow up for presyncope. She did not feel florinef was helping. She had an ETT that showed  Vertigo   02/22/2021 ALL:  Caroline Jensen is a 35 y.o. female here today for follow up for pre syncope thought to be related to hypotension. She was started on florinef 0.66m daily at consult with Dr DBrett Fairy3/2022. MRI was unremarkable. She does not feel it helped much at all. She has not taken recently. Recently, she has had dizziness, headaches, fatigue, shob, nausea, and soft stools. No syncopal episodes. She reports having an episode last week while playing with her daughter where she became really tired, sweaty, dizzy and felt "sick".   She is also followed by cardiology. Recent Ziopatch x 14 days and Echo were unremarkable. She expressed some concerns about shortness of breath while playing with her daughter that is abnormal for her. Exercise tolerance test was scheduled today but she worked last night and did not feel she could wait for 9:45 appt. She has rescheduled for next week. She feels that feeling lightheaded and dyspnea seem to correlate with exertion. She continues to have intermittent palpitations but not overly bothered by these. Heart rate fluctuates and can be as low as 40's or as high as 160's per patient. She also reported to cardiology that she had recent diarrhea. BMP unremarkable with exception of low glucose (55). She admits that she frequently skips meals. She eats mostly carbohydrates. 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 PCP has ordered glucometer but she has not picked up from pharmacy.    HISTORY (copied from Dr Dohmeier's previous note)  TTorin Whisneris a 89- year- old African American female patient was seen here upon referral on 11/23/2020 from *Dr. BHiginio Plan DO, and KLavell Anchors FNP.    Chief concern according to  patient : "The patient lost awareness on the 11-04-2020, and she collapsed forward, her fall was broken by a cMudlogger  She had spells while going to the bathroom, and while being seated or standing . Legs buckle. " She was given fluids each time, 2 spells .  Was told she was dehydrated.    Caroline Jensen has a past medical history of Atopic dermatitis (09/25/2020), Breast skin changes (12/12/2016), Fatigue (02/01/2017), headaches,  Low thyroid stimulating hormone (TSH) level (02/01/2017), and Shoulder pain, bilateral (01/06/2015)., spells of dizziness with vertigo. LOC.    The patient currently works in labor and delivery - [employer]    Tobacco use none .  ETOH use;none , Caffeine intake in form of Soda( not daily ) .   REVIEW OF SYSTEMS: Out of a complete 14 system review of symptoms, the patient complains only of the following symptoms, and all other reviewed systems are negative.    ALLERGIES: No Known Allergies   HOME MEDICATIONS: Outpatient Medications Prior to Visit  Medication Sig Dispense Refill   blood glucose meter kit and supplies KIT Dispense based on patient and insurance preference. Use up to four times daily as directed. 1 each 0   meclizine (ANTIVERT) 25 MG tablet Take 0.5-1 tablets (12.5-25 mg total) by mouth 3 (three) times daily as needed for dizziness. 30 tablet 0   meloxicam (MOBIC) 7.5 MG tablet Take 7.5 mg by mouth daily.     Multiple Vitamins-Minerals (MULTI VITAMIN/MINERALS) TABS Bid po  SUMAtriptan (IMITREX) 50 MG tablet Take 1 tablet (50 mg total) by mouth every 2 (two) hours as needed for migraine. May repeat in 2 hours if headache persists or recurs. 10 tablet 0   triamcinolone ointment (KENALOG) 0.5 % Apply 1 application topically 2 (two) times daily. For moderate to severe eczema.  Do not use for more than 1 week at a time. 60 g 3   No facility-administered medications prior to visit.     PAST MEDICAL HISTORY: Past Medical History:  Diagnosis Date    Atopic dermatitis 09/25/2020   Breast skin changes 12/12/2016   Fatigue 02/01/2017   Genital warts    Low thyroid stimulating hormone (TSH) level 02/01/2017   Palpitations 07/18/2017   Shoulder pain, bilateral 01/06/2015     PAST SURGICAL HISTORY: Past Surgical History:  Procedure Laterality Date   LASER ABLATION OF CONDYLOMAS       FAMILY HISTORY: Family History  Problem Relation Age of Onset   Drug abuse Father    Cancer Paternal Aunt        leukemia   Hypertension Maternal Aunt    Stroke Other        unknown   Cirrhosis Maternal Grandmother    Urticaria Brother    Eczema Son    Diabetes Neg Hx    Heart disease Neg Hx    Allergic rhinitis Neg Hx    Asthma Neg Hx      SOCIAL HISTORY: Social History   Socioeconomic History   Marital status: Single    Spouse name: Not on file   Number of children: Not on file   Years of education: Not on file   Highest education level: Not on file  Occupational History   Not on file  Tobacco Use   Smoking status: Never   Smokeless tobacco: Never  Vaping Use   Vaping Use: Never used  Substance and Sexual Activity   Alcohol use: No    Alcohol/week: 0.0 standard drinks   Drug use: No   Sexual activity: Not Currently    Birth control/protection: None, Abstinence  Other Topics Concern   Not on file  Social History Narrative   Lives in [place] with 3 kids (ages 51, 109, 18)   Oceanographer, 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  There is no height or weight on file to calculate BMI.   Generalized: Well developed, in no acute distress  Cardiology: normal rate and rhythm, no murmur auscultated   Respiratory: clear to auscultation bilaterally    Neurological examination  Mentation: Alert oriented to time, place, history taking. Follows all commands speech and language fluent Cranial nerve II-XII: Pupils were equal round reactive to light. Extraocular movements were full, visual field were full on confrontational test. Facial sensation and strength were normal. Uvula tongue midline. Head turning and shoulder shrug  were normal and symmetric. Motor: The motor testing reveals 5 over 5 strength of all 4 extremities. Good symmetric motor tone is noted throughout.  Sensory: Sensory testing is intact to soft touch on all 4 extremities. No evidence of extinction is noted.  Coordination: Cerebellar testing reveals  good finger-nose-finger and heel-to-shin bilaterally.  Gait and station: Gait is normal. Tandem gait is normal. Romberg is negative. No drift is seen.  Reflexes: Deep tendon reflexes are symmetric and normal bilaterally.     DIAGNOSTIC DATA (LABS, IMAGING, TESTING) - I reviewed patient records, labs, notes, testing and imaging myself where available.  Lab Results  Component Value Date   WBC 4.2 11/04/2020   HGB 12.0 11/04/2020   HCT 34.2 (L) 11/04/2020   MCV 91.4 11/04/2020   PLT 216 11/04/2020      Component Value Date/Time   NA 142 02/21/2021 1118   K 4.2 02/21/2021 1118   CL 107 (H) 02/21/2021 1118   CO2 24 02/21/2021 1118   GLUCOSE 55 (L) 02/21/2021 1118   GLUCOSE 83 11/04/2020 0215   BUN 17 02/21/2021 1118   CREATININE 0.68 02/21/2021 1118   CALCIUM 9.2 02/21/2021 1118   PROT 7.2 02/21/2020 2028   PROT 6.6 12/07/2016 1625   ALBUMIN 4.2 02/21/2020 2028   ALBUMIN 4.1 12/07/2016 1625   AST 17 02/21/2020 2028   ALT 11 02/21/2020 2028   ALKPHOS 41 02/21/2020 2028   BILITOT 0.6 02/21/2020 2028   BILITOT 0.3 12/07/2016 1625   GFRNONAA >60 11/04/2020 0215   GFRAA >60 02/21/2020 2028   No results found for: CHOL, HDL, LDLCALC, LDLDIRECT, TRIG, CHOLHDL No results  found for: HGBA1C No results found for: VITAMINB12 Lab Results  Component Value Date   TSH 0.348 (L) 11/09/2020    No flowsheet data found.   No flowsheet data found.   ASSESSMENT AND PLAN  35 y.o. year old female  has a past medical history of Atopic dermatitis (09/25/2020), Breast skin changes (12/12/2016), Fatigue (02/01/2017), Genital warts, Low thyroid stimulating hormone (TSH) level (02/01/2017), Palpitations (07/18/2017), and Shoulder pain, bilateral (01/06/2015). here with     No diagnosis found.  Adylin did not notice any significant benefit with taking florinef. We will discontinue for now. I am concerned about multiple factors contributing to symptoms. She will proceed with ETT next week. She will monitor blood sugars as directed with PCP. We have discussed healthy lifestyle habits with regular, well balanced meals and less simple carbohydrates. She will continue to monitor blood pressures. She will continue wearing compression stockings and increase water intake. She will continue meclizine for vertiginous symptoms. I will have her follow up with me in 8-12 weeks for reevaluation. She may call sooner with worsening symptoms. She verbalizes understanding and agreement with this plan.   No orders of the defined types were placed in this encounter.    No orders of the defined types were placed in this encounter.     Debbora Presto, MSN, FNP-C 04/21/2021, 3:26 PM  [facility] [address], [place], [place] [address] [phone]

## 2021-04-26 ENCOUNTER — Ambulatory Visit: Payer: No Typology Code available for payment source | Admitting: Family Medicine

## 2021-04-26 ENCOUNTER — Encounter: Payer: Self-pay | Admitting: Family Medicine

## 2021-04-26 DIAGNOSIS — R55 Syncope and collapse: Secondary | ICD-10-CM

## 2021-04-26 DIAGNOSIS — H814 Vertigo of central origin: Secondary | ICD-10-CM

## 2021-04-26 DIAGNOSIS — I959 Hypotension, unspecified: Secondary | ICD-10-CM

## 2021-05-31 ENCOUNTER — Ambulatory Visit (INDEPENDENT_AMBULATORY_CARE_PROVIDER_SITE_OTHER): Payer: Medicaid Other | Admitting: Otolaryngology

## 2021-06-06 IMAGING — MR MR SHOULDER*R* W/O CM
4 of 5 series · 29 of 40 positions shown · non-contrast
Comparison: None.

CLINICAL DATA: Shoulder pain, rotator cuff disorder suspected, xray
done rotator cuff

EXAM:
MRI OF THE RIGHT SHOULDER WITHOUT CONTRAST
TECHNIQUE: Multiplanar, multisequence MR imaging of the shoulder was performed.
No intravenous contrast was administered.

[Series 3: T2 fat-sat · axial · 4.0mm · 0.25mm/px · z∈[-40,+65]mm · 8 of 22 slices shown (1 of 3)]
[im 1/22]
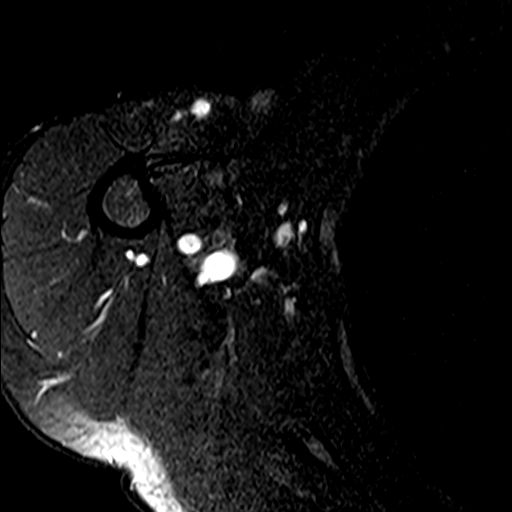
[im 3/22]
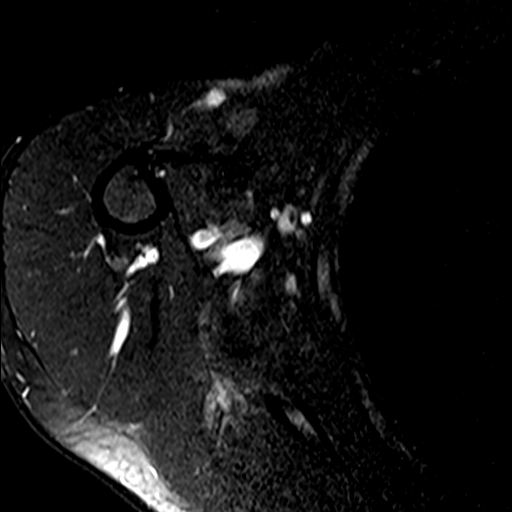
[im 8/22]
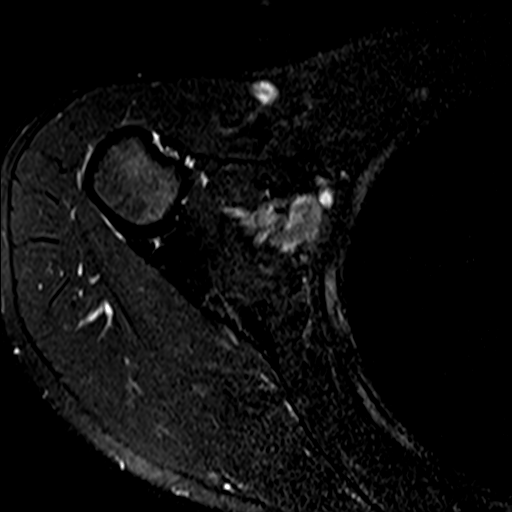
[im 10/22]
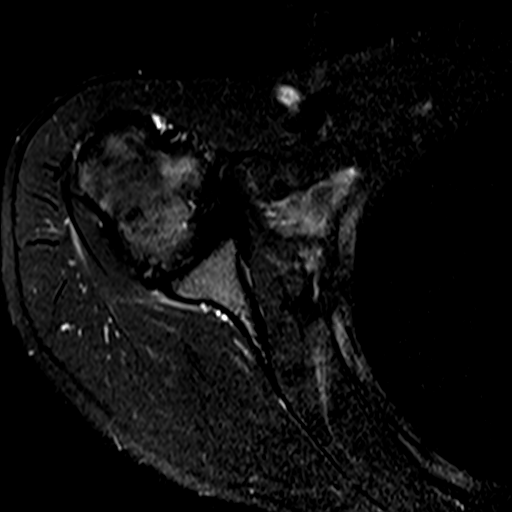
[im 12/22]
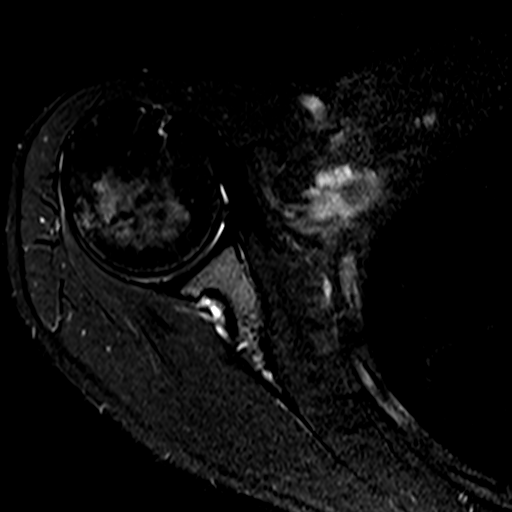
[im 15/22]
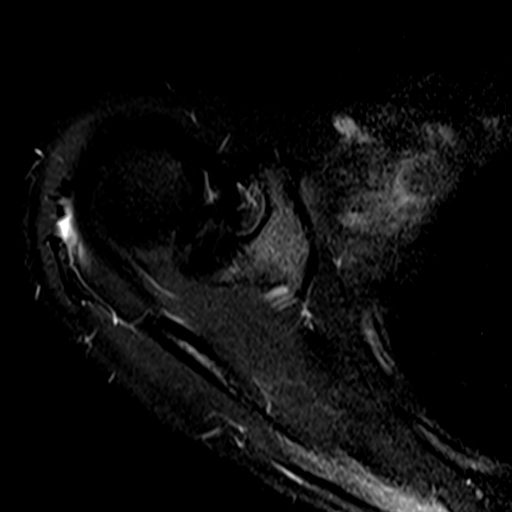
[im 19/22]
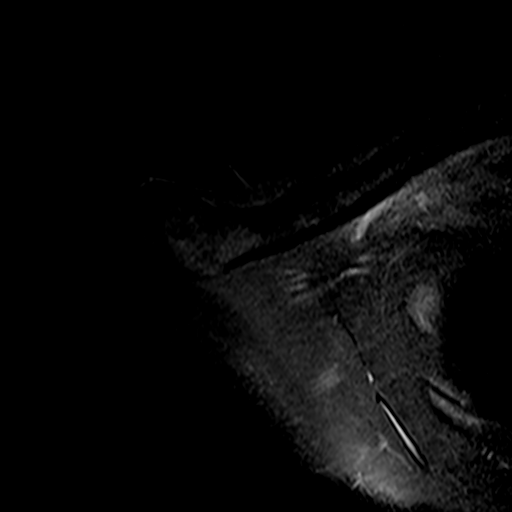
[im 22/22]
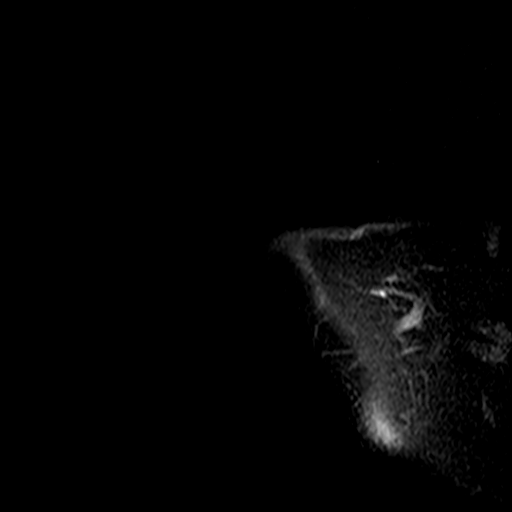

[Series 4: T2 fat-sat · sagittal · 4.0mm · 0.55mm/px · 7 of 17 slices shown (2 of 3)]
[im 1/17]
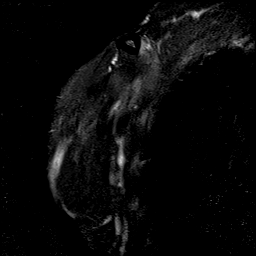
[im 3/17]
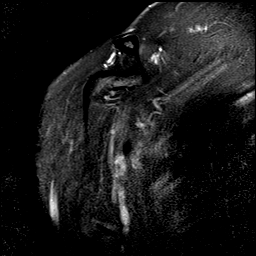
[im 6/17]
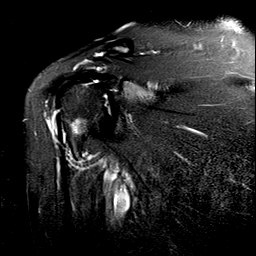
[im 9/17]
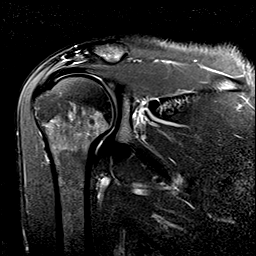
[im 11/17]
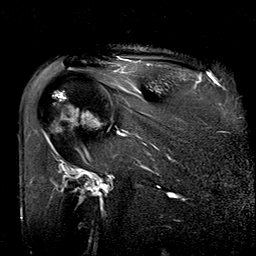
[im 14/17]
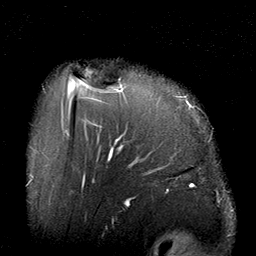
[im 17/17]
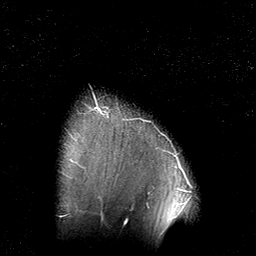

[Series 5: PD · sagittal · 4.0mm · 0.27mm/px · 7 of 17 slices shown]
[im 1/17]
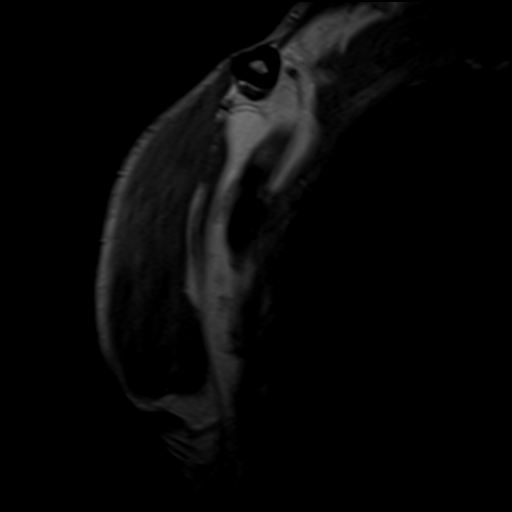
[im 3/17]
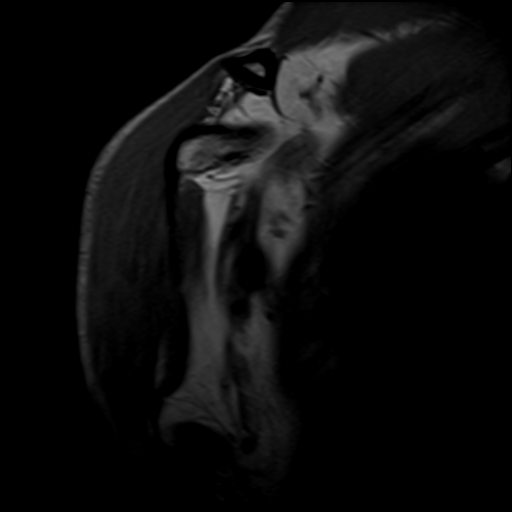
[im 6/17]
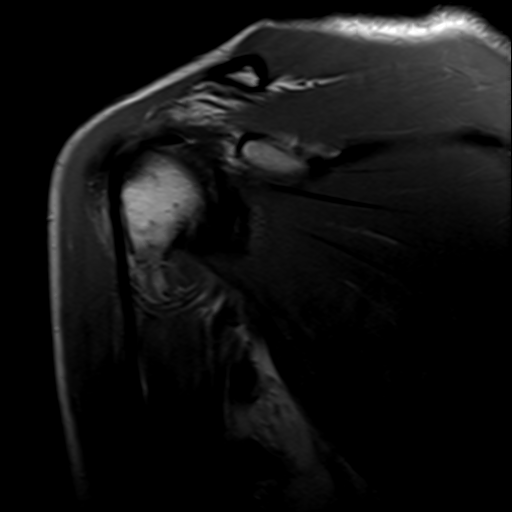
[im 9/17]
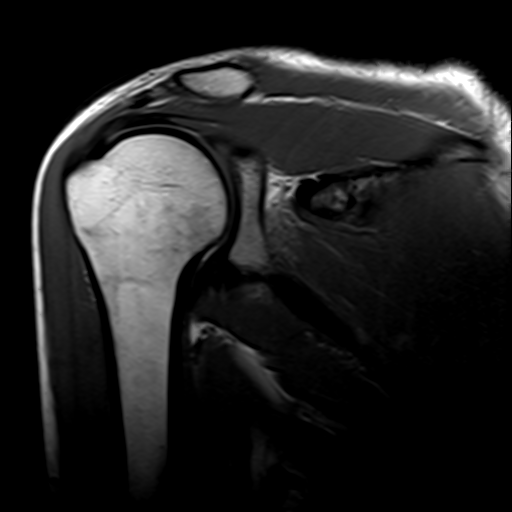
[im 11/17]
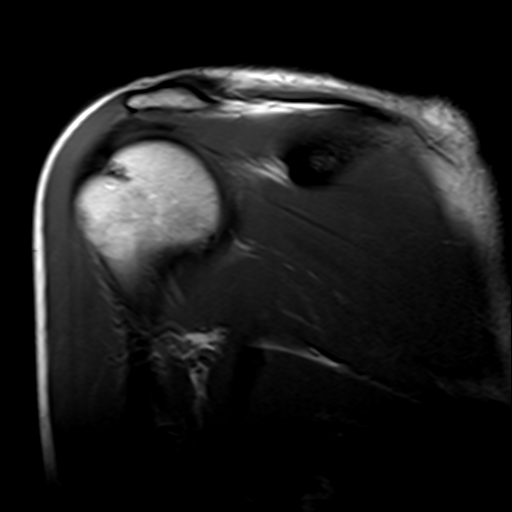
[im 14/17]
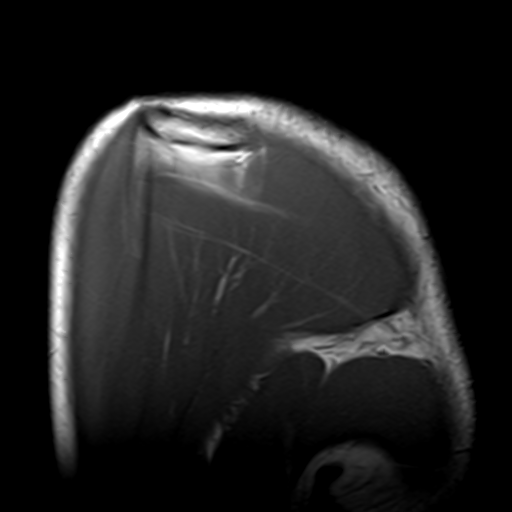
[im 17/17]
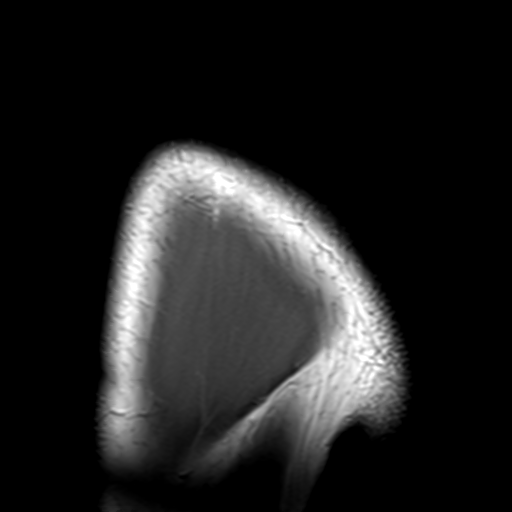

[Series 6: T2 fat-sat · oblique · 4.0mm · 0.55mm/px · 7 of 19 slices shown (3 of 3)]
[im 1/19]
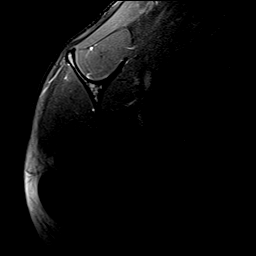
[im 3/19]
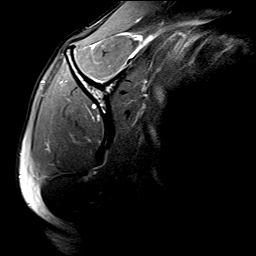
[im 6/19]
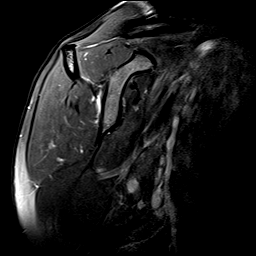
[im 8/19]
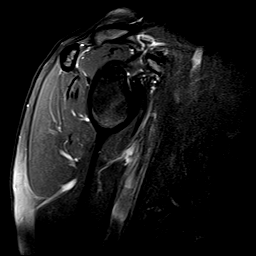
[im 11/19]
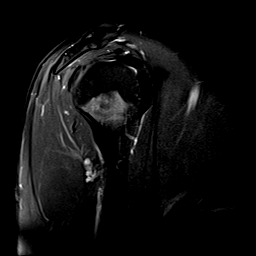
[im 13/19]
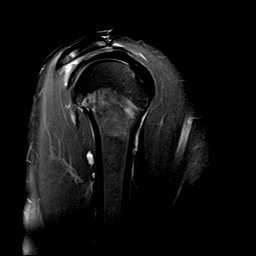
[im 16/19]
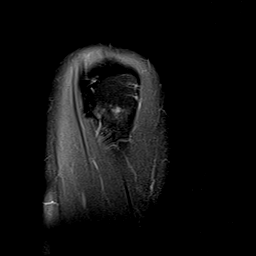

[29 of 40 positions shown; findings below may reference images not displayed]

FINDINGS: Rotator cuff: Supraspinatus tendon is intact. Infraspinatus tendon
is intact. Teres minor tendon is intact. Subscapularis tendon is
intact.

Muscles: No significant muscle atrophy. There is focal mild edema at
the origin of the lateral fibers of the deltoid muscle at the
acromion, with mild adjacent bony edema in the acromion. (Axial T2
image 7, coronal T2 image 13). The tendon appears intact.

Biceps Long Head: Intraarticular and extraarticular portions of the
biceps tendon are intact.

Acromioclavicular Joint: No significant arthropathy of the
acromioclavicular joint. No subacromial/subdeltoid bursal fluid.

Glenohumeral Joint: No joint effusion. No chondral defect.

Labrum: Grossly intact, evaluation limited due to lack of
intra-articular contrast/arthrogram technique.

Bones: No fracture or dislocation. No aggressive osseous lesion.

Other: No fluid collection or hematoma.
IMPRESSION: Findings consistent with mild muscle strain at the origin of the
lateral fibers of the deltoid muscle at the acromion with adjacent
mild reactive bony edema in the acromion. No evidence of rotator
cuff tear.

## 2021-07-24 ENCOUNTER — Other Ambulatory Visit: Payer: Self-pay

## 2021-07-24 ENCOUNTER — Ambulatory Visit (HOSPITAL_COMMUNITY)
Admission: RE | Admit: 2021-07-24 | Discharge: 2021-07-24 | Disposition: A | Discharge status: Home / Self Care | Payer: 59 | Source: Ambulatory Visit | Attending: Student | Admitting: Student

## 2021-07-24 ENCOUNTER — Encounter (HOSPITAL_COMMUNITY): Payer: Self-pay

## 2021-07-24 VITALS — BP 109/74 | HR 76 | Temp 98.3°F | Resp 18

## 2021-07-24 DIAGNOSIS — R319 Hematuria, unspecified: Secondary | ICD-10-CM

## 2021-07-24 DIAGNOSIS — R0989 Other specified symptoms and signs involving the circulatory and respiratory systems: Secondary | ICD-10-CM | POA: Insufficient documentation

## 2021-07-24 DIAGNOSIS — R509 Fever, unspecified: Secondary | ICD-10-CM | POA: Diagnosis not present

## 2021-07-24 DIAGNOSIS — R109 Unspecified abdominal pain: Secondary | ICD-10-CM

## 2021-07-24 DIAGNOSIS — R0981 Nasal congestion: Secondary | ICD-10-CM

## 2021-07-24 DIAGNOSIS — R300 Dysuria: Secondary | ICD-10-CM | POA: Diagnosis not present

## 2021-07-24 DIAGNOSIS — R059 Cough, unspecified: Secondary | ICD-10-CM | POA: Diagnosis not present

## 2021-07-24 LAB — RESPIRATORY PANEL BY PCR

## 2021-07-24 LAB — POCT URINALYSIS DIPSTICK, ED / UC
Glucose, UA: NEGATIVE mg/dL
Leukocytes,Ua: NEGATIVE
Nitrite: NEGATIVE
Protein, ur: 30 mg/dL — AB
Specific Gravity, Urine: 1.025 (ref 1.005–1.030)
Urobilinogen, UA: 0.2 mg/dL (ref 0.0–1.0)
pH: 5.5 (ref 5.0–8.0)

## 2021-07-24 LAB — POC URINE PREG, ED: Preg Test, Ur: NEGATIVE

## 2021-07-24 MED ORDER — CEPHALEXIN 500 MG PO CAPS: 500.0000 mg | ORAL_CAPSULE | Freq: Four times a day (QID) | ORAL | 0 refills | Status: DC

## 2021-07-24 MED ORDER — BENZONATATE 100 MG PO CAPS: 100.0000 mg | ORAL_CAPSULE | Freq: Three times a day (TID) | ORAL | 0 refills | Status: DC

## 2021-07-24 NOTE — Discharge Instructions (Signed)
-  Start the antibiotic: Keflex, 4x daily x5 days. You can take this with food if you have a sensitive stomach. -If abdominal/urinary symptoms get worse, like severe abd pain- head to the ED -If abd pain persists- f/u with urology  -[facility]) as needed for cough. Take one pill up to 3x daily (every 8 hours) -With a virus, you're typically contagious for 5-7 days, or as long as you're having fevers.

## 2021-07-24 NOTE — ED Triage Notes (Signed)
Complains of cough, runny nose, congestion and uti symptoms.  Onset Thursday of not feeling bad.   Denies pain while urinating, but has pain when bladder is full.  Complains of frequent urination

## 2021-07-24 NOTE — ED Provider Notes (Signed)
[facility]    CSN: [id number] Arrival date & time: 07/24/21  1459      History   Chief Complaint Chief Complaint  Patient presents with   Appointment    3:00pm   Cough    HPI Caroline Jensen is a 35 y.o. female presenting with multiple complaints: Flulike symptoms and urinary symptoms.  Medical history noncontributory.  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 -Describes 5 days of viral symptoms.  Notes fevers and chills ranging 98-100 at home, nonproductive cough that is worse at night, sore throat, headaches.  Denies body aches, nausea, vomiting, diarrhea.  Has attempted NyQuil and Mucinex, Mucinex helps but NyQuil provides minimal relief.  Denies known sick exposure, but she is exposed to many sick children at work. Denies hx cardiopulm ds. -Also with urinary symptoms for over 3 months.  She states that she has been having pain with full bladder, this resolves after urination but not immediately.  Also feels that she has to push the urine out.  Occasionally some lower abdominal cramping.  Denies vaginitis or vaginal discharge.  She has not had intercourse in 7 years.  Denies hematuria, urgency, flank pain, fever/chills.  Has not tried interventions for this at home.  Last menstrual period was about 2 weeks ago, she is unsure of date.    HPI  Past Medical History:  Diagnosis Date   Atopic dermatitis 09/25/2020   Breast skin changes 12/12/2016   Fatigue 02/01/2017   Genital warts    Low thyroid stimulating hormone (TSH) level 02/01/2017   Palpitations 07/18/2017   Shoulder pain, bilateral 01/06/2015    Patient Active Problem List   Diagnosis Date Noted   Dizziness 02/17/2021   Intractable episodic headache 11/17/2020   Pre-syncope 11/10/2020   Hematuria, microscopic 11/10/2020   Atopic dermatitis 09/25/2020   Impingement syndrome of right shoulder 09/06/2020   Intramural uterine fibroid 02/26/2020   Urge incontinence 11/11/2019   Hyperthyroidism 12/12/2016    Allergic rhinitis 01/06/2015    Past Surgical History:  Procedure Laterality Date   LASER ABLATION OF CONDYLOMAS      OB History     Gravida  3   Para  3   Term  2   Preterm  1   AB      Living  3      SAB      IAB      Ectopic      Multiple      Live Births  3            Home Medications    Prior to Admission medications   Medication Sig Start Date End Date Taking? Authorizing Provider  benzonatate (TESSALON) 100 MG capsule Take 1 capsule (100 mg total) by mouth every 8 (eight) hours. 07/24/21  Yes Hazel Sams, PA-C  cephALEXin (KEFLEX) 500 MG capsule Take 1 capsule (500 mg total) by mouth 4 (four) times daily. 07/24/21  Yes Hazel Sams, PA-C  guaiFENesin (MUCINEX) 600 MG 12 hr tablet Take by mouth 2 (two) times daily.   Yes [provider]  blood glucose meter kit and supplies KIT Dispense based on patient and insurance preference. Use up to four times daily as directed. 02/17/21   Wilber Oliphant, MD  meclizine (ANTIVERT) 25 MG tablet Take 0.5-1 tablets (12.5-25 mg total) by mouth 3 (three) times daily as needed for dizziness. 11/17/20   Mullis, Kiersten P, DO  meloxicam (MOBIC) 7.5 MG tablet Take 7.5 mg  by mouth daily. Patient not taking: Reported on 07/24/2021    [provider]  Multiple Vitamins-Minerals (MULTI VITAMIN/MINERALS) TABS Bid po 11/23/20   Dohmeier, Asencion Partridge, MD  SUMAtriptan (IMITREX) 50 MG tablet Take 1 tablet (50 mg total) by mouth every 2 (two) hours as needed for migraine. May repeat in 2 hours if headache persists or recurs. 02/17/21   Wilber Oliphant, MD  triamcinolone ointment (KENALOG) 0.5 % Apply 1 application topically 2 (two) times daily. For moderate to severe eczema.  Do not use for more than 1 week at a time. 11/10/20   [name], DO    Family History Family History  Problem Relation Age of Onset   Drug abuse Father    Cancer Paternal Aunt        leukemia   Hypertension Maternal Aunt    Stroke Other         unknown   Cirrhosis Maternal Grandmother    Urticaria Brother    Eczema Son    Diabetes Neg Hx    Heart disease Neg Hx    Allergic rhinitis Neg Hx    Asthma Neg Hx     Social History Social History   Tobacco Use   Smoking status: Never   Smokeless tobacco: Never  Vaping Use   Vaping Use: Never used  Substance Use Topics   Alcohol use: No    Alcohol/week: 0.0 standard drinks   Drug use: No     Allergies   Patient has no known allergies.   Review of Systems Review of Systems  Constitutional:  Positive for appetite change, chills and fever. Negative for diaphoresis.  HENT:  Positive for congestion. Negative for ear pain, rhinorrhea, sinus pressure, sinus pain and sore throat.   Eyes:  Negative for redness and visual disturbance.  Respiratory:  Positive for cough. Negative for chest tightness, shortness of breath and wheezing.   Cardiovascular:  Negative for chest pain and palpitations.  Gastrointestinal:  Positive for abdominal pain. Negative for blood in stool, constipation, diarrhea, nausea and vomiting.  Genitourinary:  Positive for dysuria. Negative for decreased urine volume, difficulty urinating, flank pain, frequency, genital sores, hematuria and urgency.  Musculoskeletal:  Negative for back pain and myalgias.  Neurological:  Negative for dizziness, weakness, light-headedness and headaches.  Psychiatric/Behavioral:  Negative for confusion.   All other systems reviewed and are negative.   Physical Exam Triage Vital Signs ED Triage Vitals  Enc Vitals Group     BP 07/24/21 1553 109/74     Pulse Rate 07/24/21 1553 76     Resp 07/24/21 1553 18     Temp 07/24/21 1553 98.3 F (36.8 C)     Temp Source 07/24/21 1553 Oral     SpO2 07/24/21 1553 99 %     Weight --      Height --      Head Circumference --      Peak Flow --      Pain Score 07/24/21 1549 10     Pain Loc --      Pain Edu? --      Excl. in [place]? --    No data found.  Updated Vital Signs BP  109/74 (BP Location: Left Arm)   Pulse 76   Temp 98.3 F (36.8 C) (Oral)   Resp 18   LMP 07/05/2021   SpO2 99%   Visual Acuity Right Eye Distance:   Left Eye Distance:   Bilateral Distance:    Right  Eye Near:   Left Eye Near:    Bilateral Near:     Physical Exam Vitals reviewed.  Constitutional:      General: She is not in acute distress.    Appearance: Normal appearance. She is ill-appearing.  HENT:     Head: Normocephalic and atraumatic.     Right Ear: Tympanic membrane, ear canal and external ear normal. No tenderness. No middle ear effusion. There is no impacted cerumen. Tympanic membrane is not perforated, erythematous, retracted or bulging.     Left Ear: Tympanic membrane, ear canal and external ear normal. No tenderness.  No middle ear effusion. There is no impacted cerumen. Tympanic membrane is not perforated, erythematous, retracted or bulging.     Nose: Nose normal. No congestion.     Mouth/Throat:     Mouth: Mucous membranes are moist.     Pharynx: Uvula midline. No oropharyngeal exudate or posterior oropharyngeal erythema.     Comments: Moist mucous membranes Eyes:     Extraocular Movements: Extraocular movements intact.     Pupils: Pupils are equal, round, and reactive to light.  Cardiovascular:     Rate and Rhythm: Normal rate and regular rhythm.     Heart sounds: Normal heart sounds.  Pulmonary:     Effort: Pulmonary effort is normal.     Breath sounds: Normal breath sounds. No decreased breath sounds, wheezing, rhonchi or rales.     Comments: Frequent hacking cough Abdominal:     General: Bowel sounds are normal. There is no distension.     Palpations: Abdomen is soft. There is no mass.     Tenderness: There is abdominal tenderness in the right lower quadrant. There is right CVA tenderness. There is no left CVA tenderness, guarding or rebound. Negative signs include Murphy's sign, Rovsing's sign and McBurney's sign.     Comments: There is mild RLQ and R  flank pain to deep palpation, without guarding mass or rebound. Comfortable throughout exam.  Lymphadenopathy:     Cervical: No cervical adenopathy.     Right cervical: No superficial cervical adenopathy.    Left cervical: No superficial cervical adenopathy.  Skin:    General: Skin is warm.     Capillary Refill: Capillary refill takes less than 2 seconds.     Comments: Good skin turgor  Neurological:     General: No focal deficit present.     Mental Status: She is alert and oriented to person, place, and time.  Psychiatric:        Mood and Affect: Mood normal.        Behavior: Behavior normal.        Thought Content: Thought content normal.        Judgment: Judgment normal.     UC Treatments / Results  Labs (all labs ordered are listed, but only abnormal results are displayed) Labs Reviewed  POCT URINALYSIS DIPSTICK, ED / UC - Abnormal; Notable for the following components:      Result Value   Bilirubin Urine SMALL (*)    Ketones, ur TRACE (*)    Hgb urine dipstick LARGE (*)    Protein, ur 30 (*)    All other components within normal limits  RESPIRATORY PANEL BY PCR  POC URINE PREG, ED    EKG   Radiology No results found.  Procedures Procedures (including critical care time)  Medications Ordered in UC Medications - No data to display  Initial Impression / Assessment and Plan / UC Course  I have reviewed the triage vital signs and the nursing notes.  Pertinent labs & imaging results that were available during my care of the patient were reviewed by me and considered in my medical decision making (see chart for details).     This patient is a very pleasant 35 y.o. year old female presenting with suspected influenza and urinary symptoms for months. Today this pt is afebrile nontachycardic nontachypneic, oxygenating well on room air, no wheezes rhonchi or rales. There is some reproducible RLQ abd pain and R CVAT.  UA with blood, negative nitrite, negative leuk. Did  not send culture.  Given pain and hematuria, cannot exclude nephrolithiasis. Will send keflex in case of overlying infection. Good hydration. If symptoms persist, Ddx also includes interstitial cystitis vs OAB. F/u with urology.   For flulike symptoms, trial of tessalon. She declines promethazine DM. Continue OTC medications .  ED return precautions discussed. Patient verbalizes understanding and agreement. She is a Marine scientist.    Final Clinical Impressions(s) / UC Diagnoses   Final diagnoses:  Suspected novel influenza A virus infection  Hematuria, unspecified type     Discharge Instructions      -Start the antibiotic: Keflex, 4x daily x5 days. You can take this with food if you have a sensitive stomach. -If abdominal/urinary symptoms get worse, like severe abd pain- head to the ED -If abd pain persists- f/u with urology  -Tessalon [facility]) as needed for cough. Take one pill up to 3x daily (every 8 hours) -With a virus, you're typically contagious for 5-7 days, or as long as you're having fevers.       ED Prescriptions     Medication Sig Dispense Auth. Provider   cephALEXin (KEFLEX) 500 MG capsule Take 1 capsule (500 mg total) by mouth 4 (four) times daily. 20 capsule Hazel Sams, PA-C   benzonatate (TESSALON) 100 MG capsule Take 1 capsule (100 mg total) by mouth every 8 (eight) hours. 21 capsule Hazel Sams, PA-C      PDMP not reviewed this encounter.   Hazel Sams, PA-C 07/24/21 1631

## 2021-08-09 ENCOUNTER — Ambulatory Visit: Payer: 59

## 2021-08-21 NOTE — Progress Notes (Signed)
Cardiology Office Note:    Date:  08/22/2021   ID:  Caroline Jensen, DOB [birth year], MRN [id number]  PCP:  Rise Patience, DO  Cardiologist:  None  Electrophysiologist:  None   Referring MD: Eppie Gibson, MD   Chief Complaint  Patient presents with   Palpitations     History of Present Illness:    Caroline Jensen is a 35 y.o. female with a hx of allergic rhinitis, subclinical hypothyroidism who presents for follow-up.  She was seen in urgent care on 11/16/2020 with dizziness.  Thought to be dehydrated, given IV fluids and reported some improvement.  She was referred to neurology and cardiology for further evaluation.  She previously was seen by Dr. Irish Lack for evaluation of palpitations in 2019.  30-day monitor showed no significant arrhythmias.  Seen by neurology 3/22, had orthostatics which showed no change in BP with position, but 15 bpm increase in heart rate with standing.  MRI brain was ordered.  She was prescribed compression stockings and Florinef.  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  She reports she had been standing and felt like she had to use the bathroom and after urinating stood up and felt like was going to pass out.  She went to the nursing station and lost feeling in her arms and legs.  Reports heart rate was in the 40s.  She had another episode on 3/15.  She felt dizzy and stood up and felt like she was going to pass out.  Lost feeling in her legs.  She went to urgent care at that time.  She does report she has been having palpitations a couple times per week, lasting for few minutes.  Feels like heart is racing during episodes, has noted heart rate up to 160s.  She denies any shortness of breath but reports occasional chest pain.  Describes as discomfort on the right side of her chest, occurs at rest, can last for few minutes.  She does not exercise.  No smoking history.  No  history of heart disease in her immediate family.  Zio patch x14 days on 12/30/2020 showed no significant abnormalities.  Echocardiogram on 01/03/2021 showed normal biventricular function, no significant valvular disease.  ETT on 03/01/2021 was nondiagnostic due to failure to reach target heart rate (achieved 10.1 METS, max heart rate 131 which was 70% max age-predicted heart rate).  Since last clinic visit, she reports that she has been doing better.  States that she had 1 episode of lightheadedness/palpitations in September when she was being fitted for her N95 mask.  Other than that denies any episodes.  No syncope, no other episodes of palpitations.  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  She denies any exertional chest pain or dyspnea.  No lower extremity edema.    Past Medical History:  Diagnosis Date   Atopic dermatitis 09/25/2020   Breast skin changes 12/12/2016   Fatigue 02/01/2017   Genital warts    Low thyroid stimulating hormone (TSH) level 02/01/2017   Palpitations 07/18/2017   Shoulder pain, bilateral 01/06/2015    Past Surgical History:  Procedure Laterality Date   LASER ABLATION OF CONDYLOMAS      Current Medications: Current Meds  Medication Sig   benzonatate (TESSALON) 100 MG capsule Take 1 capsule (100 mg total) by mouth every 8 (eight) hours.   blood glucose meter kit and  supplies KIT Dispense based on patient and insurance preference. Use up to four times daily as directed.   cephALEXin (KEFLEX) 500 MG capsule Take 1 capsule (500 mg total) by mouth 4 (four) times daily.   meclizine (ANTIVERT) 25 MG tablet Take 0.5-1 tablets (12.5-25 mg total) by mouth 3 (three) times daily as needed for dizziness.   meloxicam (MOBIC) 7.5 MG tablet Take 7.5 mg by mouth daily.   SUMAtriptan (IMITREX) 50 MG tablet Take 1 tablet (50 mg total) by mouth every 2 (two) hours as needed for migraine. May repeat in 2 hours if headache persists or recurs.   triamcinolone  ointment (KENALOG) 0.5 % Apply 1 application topically 2 (two) times daily. For moderate to severe eczema.  Do not use for more than 1 week at a time.     Allergies:   Patient has no known allergies.   Social History   Socioeconomic History   Marital status: Single    Spouse name: Not on file   Number of children: Not on file   Years of education: Not on file   Highest education level: Not on file  Occupational History   Not on file  Tobacco Use   Smoking status: Never   Smokeless tobacco: Never  Vaping Use   Vaping Use: Never used  Substance and Sexual Activity   Alcohol use: No    Alcohol/week: 0.0 standard drinks   Drug use: No   Sexual activity: Not Currently    Birth control/protection: None, Abstinence  Other Topics Concern   Not on file  Social History Narrative   Lives in [place] with 3 kids (ages 67, 78, 32)   Oceanographer, 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 There is no history of Diabetes, Heart disease, Allergic rhinitis, or Asthma.  ROS:   Please see the history of present illness.     All other systems reviewed and are negative.  EKGs/Labs/Other Studies Reviewed:    The following studies were reviewed today:  Echo 01/03/2021: 1. Left ventricular ejection fraction, by estimation, is 60 to 65%. The  left ventricle has normal function. The left ventricle has no regional  wall motion abnormalities. Left ventricular diastolic parameters were  normal.   2. Right ventricular  systolic function is normal. The right ventricular  size is normal. There is normal pulmonary artery systolic pressure.   3. The mitral valve is normal in structure. No evidence of mitral valve  regurgitation. No evidence of mitral stenosis.   4. The aortic valve is tricuspid. Aortic valve regurgitation is not  visualized. No aortic stenosis is present.   5. The inferior vena cava is normal in size with greater than 50%  respiratory variability, suggesting right atrial pressure of 3 mmHg.  Monitor 12/30/2020: No significant abnormalities  Patch Wear Time:  14 days and 0 hours (2022-03-31T16:30:43-0400 to 2022-04-14T16:30:47-0400)   Patient had a min HR of 43 bpm, max HR of 147 bpm, and avg HR of 73 bpm. Predominant underlying rhythm  was Sinus Rhythm. Isolated SVEs were rare (<1.0%), and no SVE Couplets or SVE Triplets were present. Isolated VEs were rare (<1.0%), and no VE Couplets  or VE Triplets were present.  2 patient triggered events, corresponding to sinus rhythm  EKG:   08/22/2021: Sinus rhythm, rate 64, no ST abnormalities, QTC 412 11/30/2020: sinus bradycardia, rate 54, no ST sbnormalities, QTc 394  Recent Labs: 11/04/2020: Hemoglobin 12.0; Platelets 216 11/09/2020: TSH 0.348 02/21/2021: BUN 17; Creatinine, Ser 0.68; Potassium 4.2; Sodium 142  Recent Lipid Panel No results found for: CHOL, TRIG, HDL, CHOLHDL, VLDL, LDLCALC, LDLDIRECT  Physical Exam:    VS:  BP 110/60    Pulse 64    Ht '5\' 2"'  (1.575 m)    Wt 111 lb 3.2 oz (50.4 kg)    SpO2 95%    BMI 20.34 kg/m     Wt Readings from Last 3 Encounters:  08/22/21 111 lb 3.2 oz (50.4 kg)  02/22/21 112 lb 12.8 oz (51.2 kg)  02/21/21 111 lb 6.4 oz (50.5 kg)     GEN: Well nourished, well developed in no acute distress HEENT: Normal NECK: No JVD; No carotid bruits LYMPHATICS: No lymphadenopathy CARDIAC: RRR, no murmurs, rubs, gallops RESPIRATORY:  Clear to auscultation without rales, wheezing or rhonchi  ABDOMEN: Soft,  non-tender, non-distended MUSCULOSKELETAL:  No edema; No deformity  SKIN: Warm and dry NEUROLOGIC:  Alert and oriented x 3 PSYCHIATRIC:  Normal affect   ASSESSMENT:    1. Palpitations   2. Near syncope   3. DOE (dyspnea on exertion)      PLAN:     Palpitations/near syncope/lightheadedness/dyspnea: Zio patch x14 days on 12/30/2020 showed no significant abnormalities.  Echocardiogram on 01/03/2021 showed normal biventricular function, no significant valvular disease.  Normal orthostatics in clinic.  She reports having dyspnea and lightheadedness with exertion, ETT on 03/01/2021 was nondiagnostic due to failure to reach target heart rate (achieved 10.1 METS, max heart rate 131 which was 70% max age-predicted heart rate). -Reports symptoms have improved.  No recent exertional symptoms.  No further cardiac work-up recommended at this time, will monitor   RTC in 1 year     Medication Adjustments/Labs and Tests Ordered: Current medicines are reviewed at length with the patient today.  Concerns regarding medicines are outlined above.  Orders Placed This Encounter  Procedures   EKG 12-Lead    No orders of the defined types were placed in this encounter.    Patient Instructions  Medication Instructions:  The current medical regimen is effective;  continue present plan and medications.  *If you need a refill on your cardiac medications before your next appointment, please call your pharmacy*  Follow-Up: At [facility], you and your health needs are our priority.  As part of our continuing mission to provide you with exceptional heart care, we have created designated Provider Care Teams.  These Care Teams include your primary Cardiologist (physician) and Advanced Practice Providers (APPs -  Physician Assistants and Nurse Practitioners) who all work together to provide you with the care you need, when you need it.  We recommend signing up for the patient portal called "MyChart".  Sign  up information is provided on this After Visit Summary.  MyChart is used to connect with patients for Virtual Visits (Telemedicine).  Patients are able to view lab/test results, encounter notes, upcoming appointments, etc.  Non-urgent messages can be sent to your provider as well.   To learn more about what you can do with MyChart,  go to [email or web address].    Your next appointment:   12 month(s)  The format for your next appointment:   In Person  Provider:   Dr.Kiyaan Haq        Signed, Donato Heinz, MD  08/22/2021 10:42 PM    [place]

## 2021-08-22 ENCOUNTER — Other Ambulatory Visit: Payer: Self-pay

## 2021-08-22 ENCOUNTER — Encounter: Payer: Self-pay | Admitting: Cardiology

## 2021-08-22 ENCOUNTER — Ambulatory Visit (INDEPENDENT_AMBULATORY_CARE_PROVIDER_SITE_OTHER): Payer: 59 | Admitting: Cardiology

## 2021-08-22 VITALS — BP 110/60 | HR 64 | Ht 62.0 in | Wt 111.2 lb

## 2021-08-22 DIAGNOSIS — R55 Syncope and collapse: Secondary | ICD-10-CM | POA: Diagnosis not present

## 2021-08-22 DIAGNOSIS — R002 Palpitations: Secondary | ICD-10-CM | POA: Diagnosis not present

## 2021-08-22 DIAGNOSIS — R0609 Other forms of dyspnea: Secondary | ICD-10-CM

## 2021-08-22 NOTE — Patient Instructions (Signed)
Medication Instructions:  The current medical regimen is effective;  continue present plan and medications.  *If you need a refill on your cardiac medications before your next appointment, please call your pharmacy*  Follow-Up: At [facility], you and your health needs are our priority.  As part of our continuing mission to provide you with exceptional heart care, we have created designated Provider Care Teams.  These Care Teams include your primary Cardiologist (physician) and Advanced Practice Providers (APPs -  Physician Assistants and Nurse Practitioners) who all work together to provide you with the care you need, when you need it.  We recommend signing up for the patient portal called "MyChart".  Sign up information is provided on this After Visit Summary.  MyChart is used to connect with patients for Virtual Visits (Telemedicine).  Patients are able to view lab/test results, encounter notes, upcoming appointments, etc.  Non-urgent messages can be sent to your provider as well.   To learn more about what you can do with MyChart, go to [email or web address].    Your next appointment:   12 month(s)  The format for your next appointment:   In Person  Provider:   Dr.Schumann

## 2021-10-04 ENCOUNTER — Other Ambulatory Visit: Payer: Self-pay

## 2021-10-04 ENCOUNTER — Ambulatory Visit (HOSPITAL_COMMUNITY)
Admission: EM | Admit: 2021-10-04 | Discharge: 2021-10-04 | Disposition: A | Discharge status: Home / Self Care | Payer: 59 | Attending: Family Medicine | Admitting: Family Medicine

## 2021-10-04 ENCOUNTER — Encounter (HOSPITAL_COMMUNITY): Payer: Self-pay

## 2021-10-04 DIAGNOSIS — M542 Cervicalgia: Secondary | ICD-10-CM | POA: Diagnosis not present

## 2021-10-04 DIAGNOSIS — M545 Low back pain, unspecified: Secondary | ICD-10-CM

## 2021-10-04 MED ORDER — IBUPROFEN 800 MG PO TABS: 800.0000 mg | ORAL_TABLET | Freq: Three times a day (TID) | ORAL | 0 refills | Status: DC | PRN

## 2021-10-04 MED ORDER — TIZANIDINE HCL 4 MG PO TABS: 4.0000 mg | ORAL_TABLET | Freq: Three times a day (TID) | ORAL | 0 refills | Status: DC | PRN

## 2021-10-04 MED ORDER — KETOROLAC TROMETHAMINE 30 MG/ML IJ SOLN
30.0000 mg | Freq: Once | INTRAMUSCULAR | Status: AC
  Administered 2021-10-04: 30 mg via INTRAMUSCULAR

## 2021-10-04 MED ORDER — KETOROLAC TROMETHAMINE 30 MG/ML IJ SOLN
INTRAMUSCULAR | Status: AC
  Filled 2021-10-04: qty 1

## 2021-10-04 NOTE — Discharge Instructions (Signed)
You have been given a shot of Toradol 30 mg today for pain  Take ibuprofen 800 mg 1 every 8 hours as needed for pain  Take tizanidine 4 mg 1 every 8 hours as needed for muscle spasms.  This medication can make you sleepy.
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
## 2021-10-26 ENCOUNTER — Other Ambulatory Visit: Payer: Self-pay

## 2021-10-26 ENCOUNTER — Encounter: Payer: Self-pay | Admitting: Family Medicine

## 2021-10-26 ENCOUNTER — Ambulatory Visit (INDEPENDENT_AMBULATORY_CARE_PROVIDER_SITE_OTHER): Payer: 59 | Admitting: Family Medicine

## 2021-10-26 ENCOUNTER — Other Ambulatory Visit (HOSPITAL_COMMUNITY)
Admission: RE | Admit: 2021-10-26 | Discharge: 2021-10-26 | Disposition: A | Discharge status: Home / Self Care | Payer: 59 | Source: Ambulatory Visit | Attending: Family Medicine | Admitting: Family Medicine

## 2021-10-26 VITALS — BP 107/77 | HR 67 | Ht 62.0 in | Wt 111.0 lb

## 2021-10-26 DIAGNOSIS — N898 Other specified noninflammatory disorders of vagina: Secondary | ICD-10-CM

## 2021-10-26 DIAGNOSIS — R8290 Unspecified abnormal findings in urine: Secondary | ICD-10-CM | POA: Diagnosis not present

## 2021-10-26 DIAGNOSIS — N3941 Urge incontinence: Secondary | ICD-10-CM

## 2021-10-26 DIAGNOSIS — Z113 Encounter for screening for infections with a predominantly sexual mode of transmission: Secondary | ICD-10-CM

## 2021-10-26 NOTE — Assessment & Plan Note (Signed)
-   No mechanical abnormalities or prolapse or signs of trauma to urethra, discussed Kegel exercises and to return if worsening or not getting better

## 2021-10-26 NOTE — Patient Instructions (Addendum)
It was wonderful to see you today.  Please bring ALL of your medications with you to every visit.   Today we talked about:  - We tested you for sexually transmitted infections - I saw a small vaginal tear that was healing well and not bleeding, I did not see any prolapse or other structural abnormality, you can try lubricants to prevent tears during intercourse - I recommend doing kegel exercises for the stress incontinence and call us if not improving or getting worse - As discussed, condoms prevent sexually transmitted infections, and if you want another form of birth control to prevent pregnancy please call and let me know!   Thank you for choosing [facility].   Please call [phone] with any questions about today's appointment.  Please be sure to schedule follow up at the front  desk before you leave today.   Please arrive at least 15 minutes prior to your scheduled appointments.   If you had blood work today, I will send you a MyChart message or a letter if results are normal. Otherwise, I will give you a call.   If you had a referral placed, they will call you to set up an appointment. Please give [place] a call if you don't hear back in the next 2 weeks.   If you need additional refills before your next appointment, please call your pharmacy first.   Yehuda Savannah, MD  Family Medicine

## 2021-10-26 NOTE — Assessment & Plan Note (Signed)
-   Vaginal exam today with small tear, discussed lubrication and intercourse to prevent mechanical trauma - STD testing performed today including blood testing per patient request - Contraceptive options discussed with patient as she currently has regular periods and is sexually active without condoms and does not desire pregnancy, she has tried IUD and ParaGard in the past and had a lot of side effects with spotting and discharge so does not want to do those again, I discussed that if it anytime she would like to try other contraceptive options she can reach back out to our office - Discussed condoms is the main way to prevent sexually transmitted infections

## 2021-10-26 NOTE — Progress Notes (Signed)
° ° °  SUBJECTIVE:   CHIEF COMPLAINT / HPI:   Vaginal odor-patient notes that she is recent become sexually active after being abstinent for the past several years.  Her last sexual intercourse was 3 days ago, last menstrual period was October 14, 2021 and she does not desire pregnancy.  Her menstrual periods she did not use condoms or lubricants or any other sex toys.  She notes having soreness and also noting noticing some urge incontinence which she has not had before.  She has had 3 vaginal deliveries but did not have urge incontinence after these.  She has not noticed vaginal discharge but has noticed a vaginal odor.  No burning when she pees.  Would like STD testing.  PERTINENT  PMH / PSH: urge incontinence  OBJECTIVE:   BP 107/77    Pulse 67    Ht 5\' 2"  (1.575 m)    Wt 111 lb (50.3 kg)    LMP 10/14/2021    SpO2 99%    BMI 20.30 kg/m   General: alert & oriented, no apparent distress, well groomed HEENT: normocephalic, atraumatic, EOM grossly intact, oral mucosa moist, neck supple Respiratory: normal respiratory effort GI: non-distended Skin: no rashes, no jaundice Psych: appropriate mood and affect  GU: Normal appearance of labia majora and minora, small tear at 6 o'clock position of vaginal introitus, not bleeding and appears to be healing. Vagina tissue pink, moist, without lesions or abrasions. Small amount of thin white discharge without appreciated odor. Cervix normal appearance, non-friable, without discharge from os.   April Zimmerman CMA present as chaperone for GU exam.  ASSESSMENT/PLAN:   Vaginal odor - Vaginal exam today with small tear, discussed lubrication and intercourse to prevent mechanical trauma - STD testing performed today including blood testing per patient request - Contraceptive options discussed with patient as she currently has regular periods and is sexually active without condoms and does not desire pregnancy, she has tried IUD and ParaGard in the past  and had a lot of side effects with spotting and discharge so does not want to do those again, I discussed that if it anytime she would like to try other contraceptive options she can reach back out to our office - Discussed condoms is the main way to prevent sexually transmitted infections  Urge incontinence - No mechanical abnormalities or prolapse or signs of trauma to urethra, discussed Kegel exercises and to return if worsening or not getting better     Lenoria Chime, MD [place]

## 2021-10-27 LAB — HIV ANTIBODY (ROUTINE TESTING W REFLEX): HIV Screen 4th Generation wRfx: NONREACTIVE

## 2021-10-27 LAB — CERVICOVAGINAL ANCILLARY ONLY
Bacterial Vaginitis (gardnerella): NEGATIVE
Candida Glabrata: NEGATIVE
Candida Vaginitis: NEGATIVE
Chlamydia: NEGATIVE
Comment: NEGATIVE
Comment: NEGATIVE
Comment: NEGATIVE
Comment: NEGATIVE
Comment: NEGATIVE
Comment: NORMAL
Neisseria Gonorrhea: NEGATIVE
Trichomonas: NEGATIVE

## 2021-10-27 LAB — RPR: RPR Ser Ql: NONREACTIVE

## 2021-11-02 ENCOUNTER — Encounter: Payer: Self-pay | Admitting: Family Medicine

## 2021-11-03 ENCOUNTER — Ambulatory Visit (INDEPENDENT_AMBULATORY_CARE_PROVIDER_SITE_OTHER): Payer: 59 | Admitting: Family Medicine

## 2021-11-03 ENCOUNTER — Other Ambulatory Visit: Payer: Self-pay

## 2021-11-03 VITALS — BP 106/69 | HR 69 | Ht 62.0 in | Wt 113.0 lb

## 2021-11-03 DIAGNOSIS — R109 Unspecified abdominal pain: Secondary | ICD-10-CM | POA: Diagnosis not present

## 2021-11-03 DIAGNOSIS — R350 Frequency of micturition: Secondary | ICD-10-CM | POA: Diagnosis not present

## 2021-11-03 DIAGNOSIS — N3941 Urge incontinence: Secondary | ICD-10-CM | POA: Diagnosis not present

## 2021-11-03 DIAGNOSIS — N3001 Acute cystitis with hematuria: Secondary | ICD-10-CM | POA: Diagnosis not present

## 2021-11-03 LAB — POCT URINALYSIS DIP (MANUAL ENTRY)
Bilirubin, UA: NEGATIVE
Glucose, UA: NEGATIVE mg/dL
Ketones, POC UA: NEGATIVE mg/dL
Nitrite, UA: NEGATIVE
Spec Grav, UA: 1.025 (ref 1.010–1.025)
Urobilinogen, UA: 0.2 E.U./dL
pH, UA: 7 (ref 5.0–8.0)

## 2021-11-03 MED ORDER — CEPHALEXIN 500 MG PO CAPS: 500.0000 mg | ORAL_CAPSULE | Freq: Four times a day (QID) | ORAL | 0 refills | Status: AC

## 2021-11-03 NOTE — Assessment & Plan Note (Signed)
Associated with other urinary symptoms, possibly related to UTI. Follow-up with no improvement with UTI treatment. ?

## 2021-11-03 NOTE — Progress Notes (Signed)
? ? ?  SUBJECTIVE:  ? ?CHIEF COMPLAINT / HPI:  ?Chief Complaint  ?Patient presents with  ? possible uti  ?  ?Reports urinary frequency for 2 weeks. Also having pain in her left lower back and suprapubic region. Has had a small amount of blood when wiping for the past few days. Unsure if blood in the urine. Denies fever, chills. Does not think she is pregnant, LMP 2/10. She does not use contraception however. ? ?Also reports ear pain in the right ear for two days and wants this checked. ? ?PERTINENT  PMH / PSH: noncontributory ? ?Patient Care Team: ?Rise Patience, DO as PCP - General (Family Medicine)  ? ?OBJECTIVE:  ? ?BP 106/69   Pulse 69   Ht 5\' 2"  (1.575 m)   Wt 113 lb (51.3 kg)   LMP 10/14/2021   SpO2 100%   BMI 20.67 kg/m?   ?Physical Exam ?Constitutional:   ?   General: She is not in acute distress. ?   Appearance: Normal appearance. She is not ill-appearing or toxic-appearing.  ?HENT:  ?   Head: Normocephalic and atraumatic.  ?   Right Ear: Tympanic membrane normal.  ?   Left Ear: Tympanic membrane normal.  ?Cardiovascular:  ?   Rate and Rhythm: Normal rate and regular rhythm.  ?Pulmonary:  ?   Effort: Pulmonary effort is normal. No respiratory distress.  ?   Breath sounds: Normal breath sounds.  ?Abdominal:  ?   Palpations: Abdomen is soft.  ?   Tenderness: There is abdominal tenderness. There is no right CVA tenderness or left CVA tenderness.  ?   Comments: Suprapubic tenderness  ?Musculoskeletal:  ?   Cervical back: Neck supple.  ?Neurological:  ?   Mental Status: She is alert.  ?  ? ?Depression screen [facility] 2/9 11/03/2021  ?Decreased Interest 0  ?Down, Depressed, Hopeless 1  ?PHQ - 2 Score 1  ?Altered sleeping 0  ?Tired, decreased energy 0  ?Change in appetite 0  ?Feeling bad or failure about yourself  0  ?Trouble concentrating 0  ?Moving slowly or fidgety/restless 0  ?Suicidal thoughts 0  ?PHQ-9 Score 1  ?Some recent data might be hidden  ?  ?{Show previous vital signs  (optional):23777} ? ? ? ?ASSESSMENT/PLAN:  ? ?Acute cystitis ?Symptoms of urinary frequency and urgency ongoing for 2 weeks. Exam reassuring. UA equivocal (trace leukocytes, trace blood, negative nitrites) but will treat given symptoms. No systemic symptoms but with back pain, there is concern for ascending infection so will treat with higher dose antibiotic regimen. Nephrolithiasis is also a consideration. ?- cephalexin 500 mg QID x 7d ?- urine culture ? ?Urge incontinence ?Associated with other urinary symptoms, possibly related to UTI. Follow-up with no improvement with UTI treatment. ?  ? ?Return if symptoms worsen or fail to improve.  ? ?Caroline Button, MD ?[name]  ?

## 2021-11-03 NOTE — Patient Instructions (Signed)
It was nice seeing you today! ? ?Take antibiotics as prescribed, 4 times a day for 7 days. ? ?Let us know if your symptoms are not getting better after treatment. ? ?Stay well, ?Zola Button, MD ?[place] ?[phone] ? ?-- ? ?Make sure to check out at the front desk before you leave today. ? ?Please arrive at least 15 minutes prior to your scheduled appointments. ? ?If you had blood work today, I will send you a MyChart message or a letter if results are normal. Otherwise, I will give you a call. ? ?If you had a referral placed, they will call you to set up an appointment. Please give [place] a call if you don't hear back in the next 2 weeks. ? ?If you need additional refills before your next appointment, please call your pharmacy first.  ?

## 2021-11-05 LAB — URINE CULTURE

## 2022-02-07 ENCOUNTER — Encounter: Payer: Self-pay | Admitting: *Deleted

## 2022-03-17 DIAGNOSIS — H5213 Myopia, bilateral: Secondary | ICD-10-CM | POA: Diagnosis not present

## 2022-04-27 ENCOUNTER — Ambulatory Visit (HOSPITAL_COMMUNITY): Payer: 59

## 2022-04-27 ENCOUNTER — Ambulatory Visit (INDEPENDENT_AMBULATORY_CARE_PROVIDER_SITE_OTHER): Payer: 59 | Admitting: Family Medicine

## 2022-04-27 VITALS — BP 105/76 | HR 68 | Ht 62.0 in | Wt 112.8 lb

## 2022-04-27 DIAGNOSIS — R109 Unspecified abdominal pain: Secondary | ICD-10-CM | POA: Diagnosis not present

## 2022-04-27 DIAGNOSIS — N3001 Acute cystitis with hematuria: Secondary | ICD-10-CM | POA: Diagnosis not present

## 2022-04-27 LAB — POCT URINALYSIS DIP (MANUAL ENTRY)
Glucose, UA: NEGATIVE mg/dL
Ketones, POC UA: NEGATIVE mg/dL
Leukocytes, UA: NEGATIVE
Nitrite, UA: NEGATIVE
Protein Ur, POC: 30 mg/dL — AB
Spec Grav, UA: >=1.03 — AB (ref 1.010–1.025)
Urobilinogen, UA: 1 E.U./dL
pH, UA: 5.5 (ref 5.0–8.0)

## 2022-04-27 LAB — POCT UA - MICROSCOPIC ONLY: WBC, Ur, HPF, POC: NONE SEEN (ref 0–5)

## 2022-04-27 LAB — POCT URINE PREGNANCY: Preg Test, Ur: NEGATIVE

## 2022-04-27 MED ORDER — CEPHALEXIN 500 MG PO CAPS: 500.0000 mg | ORAL_CAPSULE | Freq: Two times a day (BID) | ORAL | 0 refills | Status: AC

## 2022-04-27 NOTE — Patient Instructions (Signed)
It was wonderful to see you today. Thank you for allowing me to be a part of your care. Below is a short summary of what we discussed at your visit today:  Low abdominal and back pain Even though your urine test was not consistent with a UTI, I am going to treat you as though you have a UTI.  Your story and physical exam are congruent with UTI.  Start Keflex 500 mg twice daily x7 days.  I have sent your urine for culture.  If the bacteria is not sensitive to Keflex, I will call you and send you a different prescription.  If you do not improve with the antibiotics, I want you to come back to clinic for vaginal swab recheck for gonorrhea, chlamydia, trichomonas, BV, and yeast to rule out pelvic inflammatory disease.   If you have any questions or concerns, please do not hesitate to contact us via phone or MyChart message.   Ezequiel Essex, MD

## 2022-04-27 NOTE — Progress Notes (Signed)
ne   SUBJECTIVE:   CHIEF COMPLAINT / HPI:   Concern for UTI Patient today reports 2 weeks of worsening symptoms concerning for UTI.  She is experiencing left lower back pain, increased urinary frequency, increased urinary urgency without corresponding voiding.  Also experiencing right lower quadrant pain.  Denies burning with urination, fever, chills.  Some improvement with a heating pack and Tylenol.  Yesterday, the pain was so bad that made her nauseous.  LMP 04/14/2022, although not a normal period for her.  She reports lots of spotting until Sunday 8/20.  PERTINENT  PMH / PSH: Urgent continence, intramural uterine fibroid, microscopic hematuria, genital warts  OBJECTIVE:   BP 105/76   Pulse 68   Ht '5\' 2"'$  (1.575 m)   Wt 112 lb 12.8 oz (51.2 kg)   LMP 04/14/2022   SpO2 100%   BMI 20.63 kg/m    General: Awake, alert, no acute distress Respiratory: Speaking in full sentences, no respiratory distress MSK: Right CVA tenderness Abdominal: RLQ TTP, no rebound tenderness, mild TTP over suprapubic and bilateral lower quadrants  ASSESSMENT/PLAN:   Acute cystitis with hematuria History concerning for UTI.  UA demonstrates no nitrites or leukocytes as expected with UTI, but did have small bilirubin, high specific gravity, moderate blood, and 30 protein.  Few bacteria seen on microscopy.  Urine pregnancy negative.  Given history and tenderness on physical exam including right CVA tenderness, we will go ahead and treat as UTI.  Rx Keflex, will order urine culture and adjust antibiotics as necessary.  Return precautions given, see AVS for more.  Patient instructed to follow-up for repeat UA once symptoms have cleared to ensure bilirubin and hematuria have resolved.    Ezequiel Essex, MD [place]

## 2022-04-28 ENCOUNTER — Ambulatory Visit: Payer: 59

## 2022-04-29 LAB — URINE CULTURE

## 2022-05-03 DIAGNOSIS — N3001 Acute cystitis with hematuria: Secondary | ICD-10-CM | POA: Insufficient documentation

## 2022-05-03 NOTE — Assessment & Plan Note (Signed)
History concerning for UTI.  UA demonstrates no nitrites or leukocytes as expected with UTI, but did have small bilirubin, high specific gravity, moderate blood, and 30 protein.  Few bacteria seen on microscopy.  Urine pregnancy negative.  Given history and tenderness on physical exam including right CVA tenderness, we will go ahead and treat as UTI.  Rx Keflex, will order urine culture and adjust antibiotics as necessary.  Return precautions given, see AVS for more.  Patient instructed to follow-up for repeat UA once symptoms have cleared to ensure bilirubin and hematuria have resolved.

## 2022-08-11 ENCOUNTER — Telehealth: Payer: Self-pay

## 2022-08-11 MED ORDER — TRIAMCINOLONE ACETONIDE 0.5 % EX OINT: 1.0000 | TOPICAL_OINTMENT | Freq: Two times a day (BID) | CUTANEOUS | 3 refills | Status: AC

## 2022-08-11 NOTE — Telephone Encounter (Signed)
Reviewed patient's chart and appears that this would be an appropriate refill given atopic dermatitis in the past. Will send in refill.    Shady Bradish, DO

## 2022-08-11 NOTE — Telephone Encounter (Signed)
Patient calls nurse line requesting refill on triamcinolone cream. She states that she has been requesting this for weeks, however, has not been able to get from the pharmacy. Advised that this is the first request that we received and that I would forward request to PCP.   If appropriate, please send refill on triamcinolone cream to [facility] on [address] [place].   Talbot Grumbling, RN

## 2023-01-11 ENCOUNTER — Ambulatory Visit (INDEPENDENT_AMBULATORY_CARE_PROVIDER_SITE_OTHER): Payer: 59 | Admitting: Family Medicine

## 2023-01-11 ENCOUNTER — Other Ambulatory Visit (HOSPITAL_COMMUNITY)
Admission: RE | Admit: 2023-01-11 | Discharge: 2023-01-11 | Disposition: A | Discharge status: Home / Self Care | Payer: 59 | Source: Ambulatory Visit | Attending: Family Medicine | Admitting: Family Medicine

## 2023-01-11 VITALS — BP 109/75 | HR 68 | Wt 112.6 lb

## 2023-01-11 DIAGNOSIS — Z113 Encounter for screening for infections with a predominantly sexual mode of transmission: Secondary | ICD-10-CM | POA: Insufficient documentation

## 2023-01-11 DIAGNOSIS — N898 Other specified noninflammatory disorders of vagina: Secondary | ICD-10-CM | POA: Diagnosis not present

## 2023-01-11 DIAGNOSIS — Z124 Encounter for screening for malignant neoplasm of cervix: Secondary | ICD-10-CM

## 2023-01-11 NOTE — Progress Notes (Signed)
    SUBJECTIVE:   CHIEF COMPLAINT / HPI:   Boil on Genital region - Present for the last 3 days - No fevers or redness - Lightly painful, difficult area for the patient to visualize  - Has not had any injury or cuts to the area of which she is aware  Pap smear - Due for pap smear today - Patient has anxiety about HPV as she has had it before and had a colposcopy previously - Wanting STD testing as well  PERTINENT  PMH / PSH: Reviewed  OBJECTIVE:   BP 109/75   Pulse 68   Wt 112 lb 9.6 oz (51.1 kg)   LMP 01/04/2023   SpO2 100%   BMI 20.59 kg/m   General: NAD, well-appearing, well-nourished Respiratory: No respiratory distress, breathing comfortably, able to speak in full sentences Skin: warm and dry, no rashes noted on exposed skin Psych: Appropriate affect and mood Pelvic exam: VULVA: normal appearing vulva with no masses, tenderness or lesions, VAGINA: normal appearing vagina with normal color and discharge, no lesions, CERVIX: ectropion around the entire cervix, no discharge noted, cervical motion tenderness absent, PAP: Pap smear done today, exam chaperoned by Gilberto Better, CMA. 1cm cyst like lesion present under the skin on the lower left genital region next to the vaginal entry without erythema, does have mild tenderness with palpation  ASSESSMENT/PLAN:   Vaginal Cyst Patient with appearance of vaginal cyst on the left side, doesn't appear consistent with bartholin cyst based on location. Doesn't appear infected at this time and do not feel intervention with antibiotics or I&D necessary today.  - Return precautions discussed   Pap smear - completed today   STD testing Per patient request - Gc/chlamydia testing with pap    Evelena Leyden, DO [facility]

## 2023-01-11 NOTE — Patient Instructions (Signed)
Your pap smear and testing will take a few days to come back, I will call you with results if necessary. If normal results I will let you know via MyChart.    For the painful spot on your thigh, keep an eye out for redness, swelling and increased pain or fevers. If these happen over the weekend, I would get them evaluated at urgent care to make sure there isn't an infection as there does not appear to be one right now.

## 2023-01-12 LAB — HIV ANTIBODY (ROUTINE TESTING W REFLEX): HIV Screen 4th Generation wRfx: NONREACTIVE

## 2023-01-12 LAB — CYTOLOGY - PAP
Comment: NEGATIVE
Diagnosis: NEGATIVE
High risk HPV: NEGATIVE

## 2023-01-12 LAB — CERVICOVAGINAL ANCILLARY ONLY
Chlamydia: NEGATIVE
Comment: NEGATIVE
Comment: NORMAL
Neisseria Gonorrhea: NEGATIVE

## 2023-01-12 LAB — RPR: RPR Ser Ql: NONREACTIVE

## 2023-04-17 ENCOUNTER — Ambulatory Visit (INDEPENDENT_AMBULATORY_CARE_PROVIDER_SITE_OTHER): Payer: 59 | Admitting: Student

## 2023-04-17 VITALS — BP 98/58 | HR 60 | Wt 113.0 lb

## 2023-04-17 DIAGNOSIS — Z3200 Encounter for pregnancy test, result unknown: Secondary | ICD-10-CM | POA: Diagnosis not present

## 2023-04-17 DIAGNOSIS — N898 Other specified noninflammatory disorders of vagina: Secondary | ICD-10-CM | POA: Diagnosis not present

## 2023-04-17 DIAGNOSIS — R399 Unspecified symptoms and signs involving the genitourinary system: Secondary | ICD-10-CM | POA: Diagnosis not present

## 2023-04-17 DIAGNOSIS — N3091 Cystitis, unspecified with hematuria: Secondary | ICD-10-CM | POA: Diagnosis not present

## 2023-04-17 LAB — POCT WET PREP (WET MOUNT)
Clue Cells Wet Prep Whiff POC: NEGATIVE
Trichomonas Wet Prep HPF POC: ABSENT
WBC, Wet Prep HPF POC: >20

## 2023-04-17 LAB — POCT URINALYSIS DIP (MANUAL ENTRY)
Bilirubin, UA: NEGATIVE
Glucose, UA: NEGATIVE mg/dL
Ketones, POC UA: NEGATIVE mg/dL
Nitrite, UA: NEGATIVE
Protein Ur, POC: NEGATIVE mg/dL
Spec Grav, UA: >=1.03 — AB (ref 1.010–1.025)
Urobilinogen, UA: 0.2 E.U./dL
pH, UA: 5.5 (ref 5.0–8.0)

## 2023-04-17 LAB — POCT URINE PREGNANCY: Preg Test, Ur: NEGATIVE

## 2023-04-17 LAB — POCT UA - MICROSCOPIC ONLY

## 2023-04-17 MED ORDER — NITROFURANTOIN MONOHYD MACRO 100 MG PO CAPS
100.0000 mg | ORAL_CAPSULE | Freq: Two times a day (BID) | ORAL | 0 refills | Status: AC
Start: 2023-04-17 — End: 2023-04-22

## 2023-04-17 NOTE — Progress Notes (Addendum)
    SUBJECTIVE:   CHIEF COMPLAINT / HPI:   Urinary frequency Patient Dors is increased urinary frequency for the past week and some low back pain for few days. Some discharge. May or may not feel similar to her prior UTIs.  No dysuria, no incontinence, no dyspareunia.  She is sexually active, declining STD screening today.  Not on birth control and not using barrier methods consistently.   OBJECTIVE:   BP (!) 98/58   Pulse 60   Wt 113 lb (51.3 kg)   LMP 03/25/2023   SpO2 98%   BMI 20.67 kg/m    General: NAD, pleasant Cardio: RRR, no MRG. Cap Refill <2s. Respiratory: CTAB, normal wob on RA Skin: Warm and dry  GU exam completed by Dr. Fatima Blank, Chaperone Paige CNA Female genitalia: normal external genitalia, vulva, vagina, cervix, uterus and adnexa. Scant white discharge present.   ASSESSMENT/PLAN:   Cystitis with hematuria Assessment & Plan: UA positive for leukocytes and hematuria.  Wet prep negative for BV/Trich/Yeast, UPT negative.  Will treat with antibiotic.  Orders: -     Nitrofurantoin Monohyd Macro; Take 1 capsule (100 mg total) by mouth 2 (two) times daily for 5 days.  Dispense: 10 capsule; Refill: 0    Tiffany Kocher, DO [facility]

## 2023-04-17 NOTE — Assessment & Plan Note (Addendum)
UA positive for leukocytes and hematuria.  Wet prep negative for BV/Trich/Yeast, UPT negative.  Will treat with antibiotic.

## 2023-04-17 NOTE — Patient Instructions (Signed)
It was great to see you! Thank you for allowing me to participate in your care!   I recommend that you always bring your medications to each appointment as this makes it easy to ensure we are on the correct medications and helps [place] not miss when refills are needed.  Our plans for today:  -You have a urinary tract infection.  Please take Macrobid (nitrofurantoin) twice a day for 5 days. -Please follow-up if symptoms fail to improve  Take care and seek immediate care sooner if you develop any concerns. Please remember to show up 15 minutes before your scheduled appointment time!  Tiffany Kocher, DO [facility]
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
## 2023-04-25 ENCOUNTER — Ambulatory Visit (INDEPENDENT_AMBULATORY_CARE_PROVIDER_SITE_OTHER): Payer: 59 | Admitting: Family Medicine

## 2023-04-25 VITALS — BP 100/70 | HR 59 | Ht 62.0 in | Wt 110.8 lb

## 2023-04-25 DIAGNOSIS — Z23 Encounter for immunization: Secondary | ICD-10-CM | POA: Diagnosis not present

## 2023-04-25 DIAGNOSIS — Z0000 Encounter for general adult medical examination without abnormal findings: Secondary | ICD-10-CM

## 2023-04-25 NOTE — Assessment & Plan Note (Signed)
Patient is doing well and has no concerns today. She is up to date on her screenings and is not due for any today. Overdue for her Tdap today. - Tdap vaccine today - Follow up in 1 year for repeat physical

## 2023-04-25 NOTE — Patient Instructions (Signed)
It was great to see you today! Thank you for choosing [facility] for your primary care. Caroline Jensen was seen for annual physical.  Today we addressed: Tdap vaccine overdue and you will be receiving that today.  You should return to our clinic Return in about 1 year (around 04/24/2024). Please arrive 15 minutes before your appointment to ensure smooth check in process.  We appreciate your efforts in making this happen.  Thank you for allowing me to participate in your care, Fortunato Curling, DO 04/25/2023, 10:17 AM PGY-1, [facility]

## 2023-05-22 ENCOUNTER — Ambulatory Visit (INDEPENDENT_AMBULATORY_CARE_PROVIDER_SITE_OTHER): Payer: 59 | Admitting: Student

## 2023-05-22 VITALS — BP 98/74 | HR 61 | Ht 62.0 in | Wt 112.6 lb

## 2023-05-22 DIAGNOSIS — L709 Acne, unspecified: Secondary | ICD-10-CM

## 2023-05-22 DIAGNOSIS — H9201 Otalgia, right ear: Secondary | ICD-10-CM | POA: Diagnosis not present

## 2023-05-22 MED ORDER — TRETINOIN (EMOLLIENT) 0.05 % EX CREA
1.0000 | TOPICAL_CREAM | Freq: Every day | CUTANEOUS | 0 refills | Status: DC
Start: 2023-05-22 — End: 2024-05-27

## 2023-05-22 MED ORDER — CLINDAMYCIN PHOSPHATE 1 % EX GEL
Freq: Two times a day (BID) | CUTANEOUS | 0 refills | Status: DC
Start: 2023-05-22 — End: 2024-04-08

## 2023-05-22 NOTE — Progress Notes (Signed)
    SUBJECTIVE:   CHIEF COMPLAINT / HPI:   Patient is a 37 year old female presenting today for right ear pain.  According to patient pain started 2 days ago and has since resolved.  Denies any tinnitus, ear trauma or decreased hearing.  No fevers, chills, or headaches.  Acne Current expressed desire for dermatology referral due to prolonged history of facial acne.  She states she has tried multiple remedies including CeraVe a, nonscented lotions, moisturizer and facial wash.  PERTINENT  PMH / NGE:[id number]   OBJECTIVE:   BP 98/74   Pulse 61   Ht 5\' 2"  (1.575 m)   Wt 112 lb 9.6 oz (51.1 kg)   LMP 04/20/2023 (Exact Date)   SpO2 100%   BMI 20.59 kg/m    Physical Exam General: Alert, well appearing, NAD,  Ear: Normal TM bilaterally, no signs of erythema or lesion in the ear canal. Normal outer ear. Cardiovascular: RRR, No Murmurs, Normal S2/S2 Respiratory: CTAB, No wheezing or Rales Skin: Diffuse pustular lesions with erythema present onto the face with some hyperpigmentation's and different healing stages.    ASSESSMENT/PLAN:   Right ear pain Symptoms has resolved since onset. Unclear cause and normal ear exam.  Provided reassurance to patient which return precautions discussed.  Verbalized understanding and agreeable to plan.  Facial Cyst/ Acne Patient history and exam finding consistent with  acne.    On exam has diffused pustular lesion various healing stages on the face. Will manage with topical antibiotics and routine skin hygiene. -Encourage use of benzoyl peroxide facial wash 2 times daily -Rx Clindamycin facial cream to be applied twice daily -Rx tretinoin cream to be applied nightly -Advise use of skin toner (Witch hazel) -Follow-up in 4 weeks to reassess progress. -Ambulatory Derm referral placed  Jerre Simon, MD [facility]

## 2023-05-22 NOTE — Patient Instructions (Signed)
It was wonderful to meet you today. Thank you for allowing me to be a part of your care. Below is a short summary of what we discussed at your visit today:  Your ear exam was normal.  There was no signs of infection.  For your facial acne I recommend -Use of benzyl peroxide facial wash 2 times daily -Use tretinoin cream mostly at night -I have sent in prescription for BenzaClin facial cream which you apply to your face twice daily -I recommend use of facial toner such as witch hazel -Continue to use nonscented facial moisturizers.  Follow-up in 4 weeks.  If you have any questions or concerns, please do not hesitate to contact us via phone or MyChart message.   Jerre Simon, MD [facility]

## 2023-06-21 ENCOUNTER — Ambulatory Visit: Payer: Self-pay | Admitting: Student

## 2023-07-16 ENCOUNTER — Ambulatory Visit: Payer: 59 | Admitting: Student

## 2023-10-29 ENCOUNTER — Telehealth: Payer: Self-pay

## 2023-10-29 NOTE — Telephone Encounter (Signed)
 Patient calls nurse line reporting AUB.  She reports her period started yesterday as normal, however today she has been experiencing a heavier flow. She reports she has saturated 2 super plus tampons today already. This is not normal for her.  She denies any abdominal pain or passing of clots. She denies any excessive fatigue, dizziness or syncope episodes.   She reports she did give blood yesterday.   Patient advised to monitor her period today. Patient scheduled for tomorrow for evaluation.   Strict ED precautions discussed with patent.

## 2023-10-30 ENCOUNTER — Ambulatory Visit (INDEPENDENT_AMBULATORY_CARE_PROVIDER_SITE_OTHER): Payer: 59

## 2023-10-30 VITALS — BP 104/74 | HR 82 | Ht 62.0 in | Wt 113.2 lb

## 2023-10-30 DIAGNOSIS — N920 Excessive and frequent menstruation with regular cycle: Secondary | ICD-10-CM

## 2023-10-30 MED ORDER — FERROUS SULFATE 325 (65 FE) MG PO TABS: 325.0000 mg | ORAL_TABLET | ORAL | 0 refills | Status: AC

## 2023-10-30 NOTE — Patient Instructions (Signed)
 It was great to see you! Thank you for allowing me to participate in your care!  Our plans for today:  - Please start taking your iron pill daily. - Please let us know if you have any more heavy bleeding, or feel like you are going to pass out.   Please arrive 15 minutes PRIOR to your next scheduled appointment time! If you do not, this affects OTHER patients' care.  Take care and seek immediate care sooner if you develop any concerns.   Celine Mans, MD, PGY-2 [facility] 10:05 AM 10/30/2023  [facility]

## 2023-10-30 NOTE — Progress Notes (Signed)
    SUBJECTIVE:   CHIEF COMPLAINT / HPI: AUB  Went through super plus tampon quicker than normal yesterday (~1hr). Also soaked panty liner. Period underwear usually last 8 hour, but last much less long. Gave blood on Sunday. Sunday was first day of cycle. Was heavy yesterday. Slowed down in the evening. No more pain or cramping than normal. Took Tylenol and Ibuprofen last night. Has had headache last night. Has improved this morning.  Normal cycles every ~30 days, last 3-4 days.  Has not had to take iron before. Significantly less bleeding today. Only using super tampons regularly. No trauma to vaginal area. Not currently on any birth control.  PERTINENT  PMH / PSH: Hyperthyroidism, Pre-syncope, Intramural Uterine Fibroid  OBJECTIVE:   BP 104/74   Pulse 82   Ht 5\' 2"  (1.575 m)   Wt 113 lb 4 oz (51.4 kg)   LMP 10/28/2023   SpO2 100%   BMI 20.71 kg/m   General: NAD, well appearing Neuro: A&O Cardiovascular: RRR, no murmurs, no peripheral edema Respiratory: normal WOB on RA, CTAB, no wheezes, ronchi or rales Abdomen: soft, NTTP, no rebound or guarding Extremities: Moving all 4 extremities equally GU: patient declined   ASSESSMENT/PLAN:   Assessment & Plan Menorrhagia with regular cycle Heavy menstrual bleeding in the setting of recently giving blood. Suspect patient has mild acute blood loss anemia. Reassuringly, stable vitals, well appearing. Expect will approve gradually over the next several weeks. -Oral iron every other day for 8 weeks -CBC today, consider iron transfusion pending results -Recheck CBC 6 weeks -Return precautions discussed for further anemia symptoms -Tylenol and Ibuprofen as needed -Introduced birth control for future if menses continue to be heavy.  Return in 8 weeks (on 12/25/2023), or if symptoms worsen or fail to improve.  Celine Mans, MD [facility]

## 2023-10-31 ENCOUNTER — Encounter: Payer: Self-pay | Admitting: Family Medicine

## 2023-10-31 LAB — CBC
Hematocrit: 30.8 % — ABNORMAL LOW (ref 34.0–46.6)
Hemoglobin: 10.2 g/dL — ABNORMAL LOW (ref 11.1–15.9)
MCH: 30 pg (ref 26.6–33.0)
MCHC: 33.1 g/dL (ref 31.5–35.7)
MCV: 91 fL (ref 79–97)
Platelets: 196 10*3/uL (ref 150–450)
RBC: 3.4 x10E6/uL — ABNORMAL LOW (ref 3.77–5.28)
RDW: 12.7 % (ref 11.7–15.4)
WBC: 3.8 10*3/uL (ref 3.4–10.8)

## 2024-04-04 ENCOUNTER — Other Ambulatory Visit (HOSPITAL_COMMUNITY)
Admission: RE | Admit: 2024-04-04 | Discharge: 2024-04-04 | Disposition: A | Discharge status: Home / Self Care | Source: Ambulatory Visit | Attending: Family Medicine | Admitting: Family Medicine

## 2024-04-04 ENCOUNTER — Encounter: Payer: Self-pay | Admitting: Family Medicine

## 2024-04-04 ENCOUNTER — Ambulatory Visit (INDEPENDENT_AMBULATORY_CARE_PROVIDER_SITE_OTHER): Admitting: Family Medicine

## 2024-04-04 VITALS — BP 98/58 | HR 77 | Wt 112.0 lb

## 2024-04-04 DIAGNOSIS — M545 Low back pain, unspecified: Secondary | ICD-10-CM

## 2024-04-04 DIAGNOSIS — Z113 Encounter for screening for infections with a predominantly sexual mode of transmission: Secondary | ICD-10-CM | POA: Diagnosis not present

## 2024-04-04 DIAGNOSIS — D251 Intramural leiomyoma of uterus: Secondary | ICD-10-CM | POA: Diagnosis not present

## 2024-04-04 DIAGNOSIS — N926 Irregular menstruation, unspecified: Secondary | ICD-10-CM | POA: Diagnosis not present

## 2024-04-04 DIAGNOSIS — R1030 Lower abdominal pain, unspecified: Secondary | ICD-10-CM | POA: Insufficient documentation

## 2024-04-04 LAB — POCT URINALYSIS DIP (MANUAL ENTRY)
Glucose, UA: NEGATIVE mg/dL
Ketones, POC UA: NEGATIVE mg/dL
Nitrite, UA: NEGATIVE
Protein Ur, POC: 30 mg/dL — AB
Spec Grav, UA: 1.025 (ref 1.010–1.025)
Urobilinogen, UA: 1 U/dL
pH, UA: 7 (ref 5.0–8.0)

## 2024-04-04 LAB — POCT UA - MICROSCOPIC ONLY: Epithelial cells, urine per micros: >20

## 2024-04-04 LAB — POCT WET PREP (WET MOUNT)
Clue Cells Wet Prep Whiff POC: NEGATIVE
Trichomonas Wet Prep HPF POC: ABSENT

## 2024-04-04 LAB — POCT URINE PREGNANCY: Preg Test, Ur: NEGATIVE

## 2024-04-04 NOTE — Patient Instructions (Addendum)
 Thank you for coming in today! Here is a summary of what we discussed:  -I'll see you next week to follow up about what we discussed today. Please call the clinic or send a MyChart message if you have questions or concerns.  We are checking some labs today. If they are abnormal, I will call you. If they are normal, I will send you a MyChart message (if it is active) or a letter in the mail. If you do not hear about your labs in the next 2 weeks, please call the office.   Please call the clinic at [phone] if your symptoms worsen or you have any concerns.  Best, Dr Adele

## 2024-04-04 NOTE — Assessment & Plan Note (Signed)
 Diagnosed in 2021 on CTAP.  Patient was unaware of this diagnosis.  Reviewed imaging findings.  Patient is not having abnormal uterine bleeding at this time and no abnormalities on physical exam.  Appears that low back pain is secondary to constipation, given its improvement after patient had normal bowel movement today.   --Scheduled for close follow-up next week to ensure resolution of symptoms.  --Consider future TVUS if having ongoing symptoms to assess for progression of fibroid or new pathology.

## 2024-04-04 NOTE — Progress Notes (Signed)
 SUBJECTIVE:   CHIEF COMPLAINT / HPI:   Back pain and late menses --Spotting last Wednes/Thurs, expected menses start on Monday. Normally very regular cycles.  Uses an app to track --LMP July 2 --Unprotected sex on Sunday --Requests STI testing today -- Does not use contraception --No vaginal discharge. No abnormal vaginal bleeding. -- No dysuria, believes she has had increased urinary frequency -- Has also noticed BMs more frequent this week. This typically occurs with menses, difference now is that she is not having her period and her stools are more formed than usual.  Had a normal BM this morning. No dark stools or blood on TP/in toilet -- Noticed aching low back pain that started last night, had large BM this morning and the back pain has much improved. Now rates as a 2-3/10. --Also had episodes with N/V and constipation for a few months- once a month, does not seem to be associated with menses.  -- Denies fevers, change in weight --No fhx colon cancer, paternal Aunt with breast cancer at a young age- late 59s or 1s --Mom was 57 when she went thru menopause --Has R intramural fibroid diagnoses in 2021.  Denies abnormal vaginal bleeding  PERTINENT  PMH / PSH: Hyperthyroidism, urge incontinence  OBJECTIVE:   BP (!) 98/58   Pulse 77   Wt 112 lb (50.8 kg)   LMP 03/05/2024   SpO2 96%   BMI 20.49 kg/m   - General: No acute distress. Awake and conversant. - Respiratory: Respirations are non-labored. - Skin: No visible rashes or ulcers. - Abdominal: Normoactive bowel sounds, soft, nontender, nondistended - Psych: Alert and oriented. Cooperative, Appropriate mood and affect, Normal judgment. - MSK: Normal ambulation.  No TTP along spine.  Mild tenderness to palpation of left lumbar paraspinal muscles.  No CVA tenderness - Neuro: Sensation and CN II-XII grossly normal. - GU: (Chaperone Carlyon Hamilton, RN present) normal external female genitalia, no lesions or abnormalities in  vaginal canal, no discharge from cervix   ASSESSMENT/PLAN:   Assessment & Plan Missed period Reports regular menstrual cycles.  Should have started menses on 7/2:28 days of spotting last week.  UPT negative here, however last unprotected sex 5 days ago and patient does not use contraception.  Will collect serum hCG to confirm.  - hCG, serum, qualitative  Acute bilateral low back pain without sciatica Appears to be related to constipation given improvement after bowel movement today.  No abnormalities on physical exam.  UA with moderate blood, moderate leukocytes, negative nitrates.  Urine microscopy with >20 epithelial cells.  Patient reports possible urinary frequency but no dysuria.   --Will defer treatment for now given negative nitrites and low clinical suspicion for UTI, will reevaluate at follow-up appointment next week.  Screening examination for STI Patient reports unprotected sex and requests STI screening today.  No symptoms currently and no findings on physical exam.  Wet prep negative. - HIV antibody (with reflex) - RPR - Cervicovaginal ancillary only Intramural uterine fibroid Diagnosed in 2021 on CTAP.  Patient was unaware of this diagnosis.  Reviewed imaging findings.  Patient is not having abnormal uterine bleeding at this time and no abnormalities on physical exam.  Appears that low back pain is secondary to constipation, given its improvement after patient had normal bowel movement today.   --Scheduled for close follow-up next week to ensure resolution of symptoms.  --Consider future TVUS if having ongoing symptoms to assess for progression of fibroid or new pathology.  Rea Raring, MD [facility]

## 2024-04-05 LAB — RPR: RPR Ser Ql: NONREACTIVE

## 2024-04-05 LAB — HIV ANTIBODY (ROUTINE TESTING W REFLEX): HIV Screen 4th Generation wRfx: NONREACTIVE

## 2024-04-05 LAB — HCG, SERUM, QUALITATIVE: hCG,Beta Subunit,Qual,Serum: NEGATIVE m[IU]/mL (ref <6)

## 2024-04-07 ENCOUNTER — Ambulatory Visit: Payer: Self-pay | Admitting: Family Medicine

## 2024-04-07 LAB — CERVICOVAGINAL ANCILLARY ONLY
Chlamydia: NEGATIVE
Comment: NEGATIVE
Comment: NORMAL
Neisseria Gonorrhea: NEGATIVE

## 2024-04-08 ENCOUNTER — Encounter: Payer: Self-pay | Admitting: Dermatology

## 2024-04-08 ENCOUNTER — Ambulatory Visit: Admitting: Dermatology

## 2024-04-08 VITALS — BP 100/74

## 2024-04-08 DIAGNOSIS — L810 Postinflammatory hyperpigmentation: Secondary | ICD-10-CM | POA: Diagnosis not present

## 2024-04-08 DIAGNOSIS — L700 Acne vulgaris: Secondary | ICD-10-CM | POA: Diagnosis not present

## 2024-04-08 MED ORDER — TRETINOIN 0.025 % EX CREA: TOPICAL_CREAM | Freq: Every day | CUTANEOUS | 2 refills | Status: AC

## 2024-04-08 MED ORDER — CLINDAMYCIN PHOSPHATE 1 % EX SWAB: 1.0000 | Freq: Every morning | CUTANEOUS | 5 refills | Status: DC

## 2024-04-08 MED ORDER — SPIRONOLACTONE 100 MG PO TABS: 100.0000 mg | ORAL_TABLET | Freq: Every evening | ORAL | 5 refills | Status: DC

## 2024-04-08 NOTE — Progress Notes (Signed)
   New Patient Visit   Subjective  Caroline Jensen is a 38 y.o. female who presents for a NEW PATIENT appointment to be examined for the concerns as listed below.   Acne:  located at the face & neck that presented during puberty. She has not previously been treated for these areas. However, she has tried OTC Proactive, CeraVe, Cetaphil & Cureology but nothing was effective. She has done a chemical peel at [facility] that helped but has gotten worse. Patient stated the break outs are consistent so she does not believe it is menstrual related. Her PCP Rx clindamycin  gel & tretinoin  but she was unable to obtain due to pharmacy canceling the script.  Patient denied Hx of Bx. Patient denied family Hx of skin cancer.   The following portions of the chart were reviewed this encounter and updated as appropriate: medications, allergies, medical history  Review of Systems:  No other skin or systemic complaints except as noted in HPI or Assessment and Plan.  Objective  Well appearing patient in no apparent distress; mood and affect are within normal limits.   A focused examination was performed of the following areas: face & neck   Relevant exam findings are noted in the Assessment and Plan.           Assessment & Plan   Acne vulgaris with post-inflammatory hyperpigmentation - Assessment: Patient presents with inflammatory acne papules, open and closed comedones on the face, neck, and chest. Evidence of post-inflammatory hyperpigmentation (PIH) in areas of prior acne. Acne appears hormone-responsive, with cyclical flares related to menstrual cycle. Previous treatment with tretinoin  not followed through. Current presentation not at its worst, but visible signs of recent flares and significant PIH.  - Plan:    Initiate oral medication:     - Start spironolactone  100 mg PO at bedtime     - Informed patient of potential side effects: lightheadedness, dizziness     - Advised to maintain  adequate hydration    Implement topical regimen:     - AM: Clindamycin  swabs to affected areas, followed by Centella sunscreen/moisturizer     - PM: Tretinoin  0.025% cream        Start with 2 nights/week for 2 weeks, then increase to 3 nights/week if tolerated        Apply pea-sized amount to face after cleansing, followed by moisturizer     - Eucerin Radiant Tone serum nightly (on non-tretinoin  nights and after tretinoin )    Patient education:     - Educated on potential initial flare and expected timeline for improvement     - Advised that makeup use is acceptable, emphasizing importance of removal before sleep    Follow-up in 4-5 months for progress evaluation and photos ACNE VULGARIS   Related Medications spironolactone  (ALDACTONE ) 100 MG tablet Take 1 tablet (100 mg total) by mouth at bedtime. clindamycin  (CLEOCIN  T) 1 % SWAB Apply 1 Application topically in the morning. tretinoin  (RETIN-A ) 0.025 % cream Apply topically at bedtime. Apply pea size amount to face on Tu/Th nights. In 2 weeks increase to 3 nights a week on MWF.  Return in about 6 months (around 10/09/2024) for acne.   Documentation: I have reviewed the above documentation for accuracy and completeness, and I agree with the above.  I, Shirron Maranda, CMA, am acting as scribe for [employer], DO.   Delon Lenis, DO

## 2024-04-08 NOTE — Patient Instructions (Addendum)
 Date: April 08, 2024  Hello Ilma,  Thank you for visiting today. Here is a summary of the key instructions:  Medications: - Take spironolactone  100 mg at night - Use clindamycin  swabs on face and chest in the morning - Apply tretinoin  0.025% at night:   - Start with 2 nights a week for 2 weeks   - If no irritation, increase to 3 nights a week   - Use a pea-sized amount on face  Skincare Routine: Morning: - Wash face - Apply clindamycin  swabs - Apply Centella sunscreen/moisturizer  Night: - Wash face - Apply tretinoin  (on treatment nights) - Apply Eucerin Radiant Tone serum  Lifestyle Changes: - Drink enough water to stay hydrated - Remove makeup before sleeping  Follow-up: - Return in 4-5 months for follow-up appointment  Precautions: - Watch for lightheadedness or dizziness in the morning - Stop spironolactone  if these symptoms occur  Please reach out if you have any questions or concerns.  Warm regards,  Dr. Delon Lenis, Dermatology         Important Information  Due to recent changes in healthcare laws, you may see results of your pathology and/or laboratory studies on MyChart before the doctors have had a chance to review them. We understand that in some cases there may be results that are confusing or concerning to you. Please understand that not all results are received at the same time and often the doctors may need to interpret multiple results in order to provide you with the best plan of care or course of treatment. Therefore, we ask that you please give us  2 business days to thoroughly review all your results before contacting the office for clarification. Should we see a critical lab result, you will be contacted sooner.   If You Need Anything After Your Visit  If you have any questions or concerns for your doctor, please call our main line at [phone] If no one answers, please leave a voicemail as directed and we will return your call  as soon as possible. Messages left after 4 pm will be answered the following business day.   You may also send us  a message via MyChart. We typically respond to MyChart messages within 1-2 business days.  For prescription refills, please ask your pharmacy to contact our office. Our fax number is [phone].  If you have an urgent issue when the clinic is closed that cannot wait until the next business day, you can page your doctor at the number below.    Please note that while we do our best to be available for urgent issues outside of office hours, we are not available 24/7.   If you have an urgent issue and are unable to reach us , you may choose to seek medical care at your doctor's office, retail clinic, urgent care center, or emergency room.  If you have a medical emergency, please immediately call 911 or go to the emergency department. In the event of inclement weather, please call our main line at [phone] for an update on the status of any delays or closures.  Dermatology Medication Tips: Please keep the boxes that topical medications come in in order to help keep track of the instructions about where and how to use these. Pharmacies typically print the medication instructions only on the boxes and not directly on the medication tubes.   If your medication is too expensive, please contact our office at [phone] or send us  a message through MyChart.  We are unable to tell what your co-pay for medications will be in advance as this is different depending on your insurance coverage. However, we may be able to find a substitute medication at lower cost or fill out paperwork to get insurance to cover a needed medication.   If a prior authorization is required to get your medication covered by your insurance company, please allow us  1-2 business days to complete this process.  Drug prices often vary depending on where the prescription is filled and some pharmacies may offer  cheaper prices.  The website www.goodrx.com contains coupons for medications through different pharmacies. The prices here do not account for what the cost may be with help from insurance (it may be cheaper with your insurance), but the website can give you the price if you did not use any insurance.  - You can print the associated coupon and take it with your prescription to the pharmacy.  - You may also stop by our office during regular business hours and pick up a GoodRx coupon card.  - If you need your prescription sent electronically to a different pharmacy, notify our office through [facility] or by phone at [phone]

## 2024-04-09 ENCOUNTER — Encounter: Payer: Self-pay | Admitting: Dermatology

## 2024-04-11 ENCOUNTER — Ambulatory Visit: Payer: Self-pay | Admitting: Family Medicine

## 2024-04-23 ENCOUNTER — Ambulatory Visit (INDEPENDENT_AMBULATORY_CARE_PROVIDER_SITE_OTHER): Admitting: Family Medicine

## 2024-04-23 ENCOUNTER — Encounter: Payer: Self-pay | Admitting: Family Medicine

## 2024-04-23 ENCOUNTER — Ambulatory Visit: Payer: Self-pay | Admitting: Family Medicine

## 2024-04-23 VITALS — BP 106/61 | HR 70 | Ht 62.0 in | Wt 112.2 lb

## 2024-04-23 DIAGNOSIS — Z3200 Encounter for pregnancy test, result unknown: Secondary | ICD-10-CM

## 2024-04-23 DIAGNOSIS — E0590 Thyrotoxicosis, unspecified without thyrotoxic crisis or storm: Secondary | ICD-10-CM

## 2024-04-23 DIAGNOSIS — R350 Frequency of micturition: Secondary | ICD-10-CM | POA: Diagnosis not present

## 2024-04-23 LAB — POCT URINALYSIS DIP (MANUAL ENTRY)
Bilirubin, UA: NEGATIVE
Glucose, UA: NEGATIVE mg/dL
Ketones, POC UA: NEGATIVE mg/dL
Nitrite, UA: NEGATIVE
Protein Ur, POC: NEGATIVE mg/dL
Spec Grav, UA: 1.02 (ref 1.010–1.025)
Urobilinogen, UA: 0.2 U/dL
pH, UA: 7 (ref 5.0–8.0)

## 2024-04-23 LAB — POCT UA - MICROSCOPIC ONLY: WBC, Ur, HPF, POC: NONE SEEN (ref 0–5)

## 2024-04-23 LAB — POCT URINE PREGNANCY: Preg Test, Ur: NEGATIVE

## 2024-04-23 NOTE — Patient Instructions (Signed)
 Thank you for coming in today! Here is a summary of what we discussed:   We are checking some labs today. If they are abnormal, I will call you. If they are normal, I will send you a MyChart message (if it is active) or a letter in the mail. If you do not hear about your labs in the next 2 weeks, please call the office.   Please call the clinic at [phone] if your symptoms worsen or you have any concerns.  Best, Dr Adele

## 2024-04-23 NOTE — Assessment & Plan Note (Signed)
 TSH last checked in 2022.  Will repeat today to ensure no abnormalities - TSH Rfx on Abnormal to Free T4

## 2024-04-23 NOTE — Progress Notes (Signed)
    SUBJECTIVE:   CHIEF COMPLAINT / HPI:   Follow-up -back/abdominal pain, urinary concerns  --Urinary frequency nightly for the last few weeks --Getting up at night to urinate, urgency and feels she can't fully empty bladder --Some pain to bilateral inguinal areas --No fever, chills --No other episodes of vomiting or nausea --BM - going 2x per week, sometimes softer --LMP 8/1 --Last sex 8/16 --Not using contraception --Does not desire pregnancy at this time  History of hyperthyroidism, last checked in 2022 --Has never been on any medications for hyperthyroid  PERTINENT  PMH / PSH: Hyperthyroidism, urge incontinence, intramural uterine fibroid  OBJECTIVE:   BP 106/61   Pulse 70   Ht 5' 2 (1.575 m)   Wt 112 lb 3.2 oz (50.9 kg)   LMP 04/04/2024   SpO2 100%   BMI 20.52 kg/m   General: Awake and conversant, no acute distress Respiratory: Normal work of breathing on room air Abdominal: Soft, nontender, nondistended, normal active bowel sounds MSK: No CVA tenderness bilaterally Psych: Appropriate mood and affect  ASSESSMENT/PLAN:   Assessment & Plan Urinary frequency Several weeks of urinary frequency with report of pelvic pain.  Was recently started on spironolactone  by dermatologist, patient takes this in the evening due to effect on blood pressure,/dizziness if taken in the morning.  Will check urine for UTI today.  Advised patient if this is negative, may need to talk to dermatologist about spironolactone  as this may be causing nocturnal urinary frequency. - POCT urinalysis dipstick - POCT UA - Microscopic Only - Urine culture Possible pregnancy Will check UPT given patient does not use contraception.  Recommend further conversation about contraceptive options at follow-up appointment. - POCT urine pregnancy Hyperthyroidism TSH last checked in 2022.  Will repeat today to ensure no abnormalities - TSH Rfx on Abnormal to Free T4    Rea Raring, MD [facility]  [facility]

## 2024-04-23 NOTE — Assessment & Plan Note (Addendum)
 Several weeks of urinary frequency with report of pelvic pain.  Was recently started on spironolactone  by dermatologist, patient takes this in the evening due to effect on blood pressure,/dizziness if taken in the morning.  Will check urine for UTI today.  Advised patient if this is negative, may need to talk to dermatologist about spironolactone  as this may be causing nocturnal urinary frequency. - POCT urinalysis dipstick - POCT UA - Microscopic Only - Urine culture

## 2024-04-24 LAB — TSH RFX ON ABNORMAL TO FREE T4: TSH: 0.597 u[IU]/mL (ref 0.450–4.500)

## 2024-04-26 LAB — URINE CULTURE

## 2024-05-27 ENCOUNTER — Ambulatory Visit (INDEPENDENT_AMBULATORY_CARE_PROVIDER_SITE_OTHER): Admitting: Family Medicine

## 2024-05-27 ENCOUNTER — Encounter: Payer: Self-pay | Admitting: Family Medicine

## 2024-05-27 VITALS — BP 106/71 | HR 56 | Ht 62.0 in | Wt 114.0 lb

## 2024-05-27 DIAGNOSIS — D62 Acute posthemorrhagic anemia: Secondary | ICD-10-CM | POA: Diagnosis not present

## 2024-05-27 DIAGNOSIS — Z0000 Encounter for general adult medical examination without abnormal findings: Secondary | ICD-10-CM | POA: Diagnosis not present

## 2024-05-27 NOTE — Assessment & Plan Note (Signed)
 PHQ score 0, reviewed and discussed. Blood pressure reviewed and at goal.  Asked about intimate partner violence and patient reports none.  The patient isn't currently using any form of contraception and declines. LMP 9/19, still has heavy bleeding on heavy days.  Considered the following items based upon USPSTF recommendations: HIV testing: NR 1 month ago Hepatitis C: negative Hepatitis B:up to date Syphilis if at high risk: discussed and declined GC/CT: 1 month ago and negative Reviewed risk factors for latent tuberculosis and not indicated.  Discussed family history, BRCA testing not indicated. Aunt with breast cancer, but no one else. Patient had a mammogram in 2018 which was normal and recommended screening mammogram at 40.  Cervical cancer screening: prior Pap reviewed, repeat due in May 2029 Immunizations: Flu shot  MyChart Activation:Already signed up  Follow up in 1 year or sooner if indicated.

## 2024-05-27 NOTE — Progress Notes (Signed)
    SUBJECTIVE:   Chief compliant/HPI: annual examination  Caroline Jensen is a 38 y.o. who presents today for an annual exam.   Review of systems form notable for heavy bleeding with menstrual cycles, currently on cycle.   Updated history tabs and problem list.   OBJECTIVE:   BP 106/71   Pulse (!) 56   Ht 5' 2 (1.575 m)   Wt 114 lb (51.7 kg)   LMP 05/23/2024   SpO2 100%   BMI 20.85 kg/m   General: Awake and Alert in NAD HEENT: NCAT. Sclera anicteric. No rhinorrhea. Cardiovascular: RRR. No M/R/G Respiratory: CTAB, normal WOB on RA. No wheezing, crackles, rhonchi, or diminished breath sounds. Abdomen: Soft, non-tender, non-distended. Bowel sounds normoactive Extremities: Able to move all extremities. No BLE edema, no deformities or significant joint findings. Skin: Warm and dry. No abrasions or rashes noted. Neuro: A&Ox3. No focal neurological deficits.  ASSESSMENT/PLAN:   Assessment & Plan Annual visit for general adult medical examination without abnormal findings PHQ score 0, reviewed and discussed. Blood pressure reviewed and at goal.  Asked about intimate partner violence and patient reports none.  The patient isn't currently using any form of contraception and declines. LMP 9/19, still has heavy bleeding on heavy days.  Considered the following items based upon USPSTF recommendations: HIV testing: NR 1 month ago Hepatitis C: negative Hepatitis B:up to date Syphilis if at high risk: discussed and declined GC/CT: 1 month ago and negative Reviewed risk factors for latent tuberculosis and not indicated.  Discussed family history, BRCA testing not indicated. Aunt with breast cancer, but no one else. Patient had a mammogram in 2018 which was normal and recommended screening mammogram at 40.  Cervical cancer screening: prior Pap reviewed, repeat due in May 2029 Immunizations: Flu shot  MyChart Activation:Already signed up  Follow up in 1 year or sooner if indicated.   Anemia due to acute blood loss CBC from February 2025 with hemoglobin of 10.2, MCV of 91 in the setting of AUB.  Was started on ferrous sulfate , however does not recall taking this medication and was unsure as to why this was prescribed.  Will follow-up on CBC and iron studies to reevaluate.  Will follow-up with lab results through MyChart.  Caroline Melena, DO [name]

## 2024-05-27 NOTE — Patient Instructions (Signed)
 It was great to see you today! Thank you for choosing [facility] for your primary care. Caroline Jensen was seen for annual physical.  Today we addressed: Anemia - rechecking your iron levels and blood count  We are checking some labs today. I will send you a MyChart message with your results, per your preference. If you do not hear about your labs in the next 2 weeks, please call the office.  You should return to our clinic No follow-ups on file. Please arrive 15 minutes before your appointment to ensure smooth check in process.  We appreciate your efforts in making this happen.  Thank you for allowing me to participate in your care, [name], DO 05/27/2024, 5:00 PM PGY-2, [facility]

## 2024-05-28 ENCOUNTER — Ambulatory Visit: Payer: Self-pay | Admitting: Family Medicine

## 2024-05-28 LAB — CBC WITH DIFFERENTIAL/PLATELET
Basophils Absolute: 0 x10E3/uL (ref 0.0–0.2)
Basos: 0 %
EOS (ABSOLUTE): 0.1 x10E3/uL (ref 0.0–0.4)
Eos: 1 %
Hematocrit: 33.5 % — ABNORMAL LOW (ref 34.0–46.6)
Hemoglobin: 10.7 g/dL — ABNORMAL LOW (ref 11.1–15.9)
Immature Grans (Abs): 0 x10E3/uL (ref 0.0–0.1)
Immature Granulocytes: 0 %
Lymphocytes Absolute: 1.3 x10E3/uL (ref 0.7–3.1)
Lymphs: 29 %
MCH: 28.5 pg (ref 26.6–33.0)
MCHC: 31.9 g/dL (ref 31.5–35.7)
MCV: 89 fL (ref 79–97)
Monocytes Absolute: 0.5 x10E3/uL (ref 0.1–0.9)
Monocytes: 11 %
Neutrophils Absolute: 2.6 x10E3/uL (ref 1.4–7.0)
Neutrophils: 59 %
Platelets: 250 x10E3/uL (ref 150–450)
RBC: 3.75 x10E6/uL — ABNORMAL LOW (ref 3.77–5.28)
RDW: 16.2 % — ABNORMAL HIGH (ref 11.7–15.4)
WBC: 4.6 x10E3/uL (ref 3.4–10.8)

## 2024-05-28 LAB — IRON AND TIBC
Iron Saturation: 7 % — CL (ref 15–55)
Iron: 24 ug/dL — ABNORMAL LOW (ref 27–159)
Total Iron Binding Capacity: 366 ug/dL (ref 250–450)
UIBC: 342 ug/dL (ref 131–425)

## 2024-05-28 LAB — FERRITIN: Ferritin: 9 ng/mL — ABNORMAL LOW (ref 15–150)

## 2024-06-04 ENCOUNTER — Ambulatory Visit
Admission: RE | Admit: 2024-06-04 | Discharge: 2024-06-04 | Disposition: A | Discharge status: Home / Self Care | Source: Ambulatory Visit

## 2024-06-04 ENCOUNTER — Emergency Department (HOSPITAL_COMMUNITY)

## 2024-06-04 ENCOUNTER — Other Ambulatory Visit: Payer: Self-pay

## 2024-06-04 ENCOUNTER — Emergency Department (HOSPITAL_COMMUNITY)
Admission: EM | Admit: 2024-06-04 | Discharge: 2024-06-04 | Disposition: A | Discharge status: Home / Self Care | Attending: Emergency Medicine | Admitting: Emergency Medicine

## 2024-06-04 ENCOUNTER — Ambulatory Visit: Payer: Self-pay

## 2024-06-04 ENCOUNTER — Encounter (HOSPITAL_COMMUNITY): Payer: Self-pay | Admitting: *Deleted

## 2024-06-04 VITALS — BP 119/78 | HR 71 | Temp 97.8°F | Resp 16

## 2024-06-04 DIAGNOSIS — R1032 Left lower quadrant pain: Secondary | ICD-10-CM | POA: Insufficient documentation

## 2024-06-04 DIAGNOSIS — M545 Low back pain, unspecified: Secondary | ICD-10-CM | POA: Diagnosis not present

## 2024-06-04 DIAGNOSIS — E039 Hypothyroidism, unspecified: Secondary | ICD-10-CM | POA: Insufficient documentation

## 2024-06-04 DIAGNOSIS — R109 Unspecified abdominal pain: Secondary | ICD-10-CM

## 2024-06-04 LAB — POCT URINE DIPSTICK
Bilirubin, UA: NEGATIVE
Glucose, UA: NEGATIVE mg/dL
Leukocytes, UA: NEGATIVE
Nitrite, UA: NEGATIVE
Spec Grav, UA: 1.02 (ref 1.010–1.025)
Urobilinogen, UA: 1 U/dL
pH, UA: 7.5 (ref 5.0–8.0)

## 2024-06-04 LAB — CBC
HCT: 38.7 % (ref 36.0–46.0)
Hemoglobin: 12.2 g/dL (ref 12.0–15.0)
MCH: 27.7 pg (ref 26.0–34.0)
MCHC: 31.5 g/dL (ref 30.0–36.0)
MCV: 88 fL (ref 80.0–100.0)
Platelets: 260 K/uL (ref 150–400)
RBC: 4.4 MIL/uL (ref 3.87–5.11)
RDW: 16.8 % — ABNORMAL HIGH (ref 11.5–15.5)
WBC: 6.5 K/uL (ref 4.0–10.5)
nRBC: 0 % (ref 0.0–0.2)

## 2024-06-04 LAB — COMPREHENSIVE METABOLIC PANEL WITH GFR
ALT: 11 U/L (ref 0–44)
AST: 21 U/L (ref 15–41)
Albumin: 4.6 g/dL (ref 3.5–5.0)
Alkaline Phosphatase: 52 U/L (ref 38–126)
Anion gap: 10 (ref 5–15)
BUN: 10 mg/dL (ref 6–20)
CO2: 25 mmol/L (ref 22–32)
Calcium: 9.5 mg/dL (ref 8.9–10.3)
Chloride: 103 mmol/L (ref 98–111)
Creatinine, Ser: 0.72 mg/dL (ref 0.44–1.00)
GFR, Estimated: >60 mL/min (ref >60)
Glucose, Bld: 78 mg/dL (ref 70–99)
Potassium: 3.9 mmol/L (ref 3.5–5.1)
Sodium: 138 mmol/L (ref 135–145)
Total Bilirubin: 0.3 mg/dL (ref 0.0–1.2)
Total Protein: 7.4 g/dL (ref 6.5–8.1)

## 2024-06-04 LAB — URINALYSIS, ROUTINE W REFLEX MICROSCOPIC
Bilirubin Urine: NEGATIVE
Glucose, UA: NEGATIVE mg/dL
Ketones, ur: 5 mg/dL — AB
Leukocytes,Ua: NEGATIVE
Nitrite: NEGATIVE
Protein, ur: NEGATIVE mg/dL
Specific Gravity, Urine: 1.009 (ref 1.005–1.030)
pH: 8 (ref 5.0–8.0)

## 2024-06-04 LAB — POCT URINE PREGNANCY: Preg Test, Ur: NEGATIVE

## 2024-06-04 LAB — LIPASE, BLOOD: Lipase: 40 U/L (ref 11–51)

## 2024-06-04 LAB — HCG, SERUM, QUALITATIVE: Preg, Serum: NEGATIVE

## 2024-06-04 MED ORDER — IOHEXOL 300 MG/ML  SOLN
100.0000 mL | Freq: Once | INTRAMUSCULAR | Status: AC | PRN
  Administered 2024-06-04: 100 mL via INTRAVENOUS

## 2024-06-04 NOTE — ED Provider Notes (Signed)
 [facility]  Note:  This document was prepared using Dragon voice recognition software and may include unintentional dictation errors.  MRN: [id number] DOB: 03/11/1986  Subjective:   Caroline Jensen is a 38 y.o. female presenting for severe left lower quadrant abdominal pain and left lower back pain x 2 weeks.  Patient reports that last day her symptoms have worsened considerably.  She is evaluated primary care previously and urinalysis and other testing ordered all revealing no obvious findings.  Patient states severe pain with palpation of the abdomen.  Denies any nausea/vomiting, diarrhea, fever, body aches, fatigue.  Patient denies taking any over-the-counter medication to treat symptoms.  No current facility-administered medications for this encounter.  Current Outpatient Medications:    clindamycin  (CLEOCIN  T) 1 % SWAB, Apply 1 Application topically in the morning., Disp: 60 each, Rfl: 5   ferrous sulfate  325 (65 FE) MG tablet, Take 1 tablet (325 mg total) by mouth every other day. (Patient not taking: Reported on 05/27/2024), Disp: 45 tablet, Rfl: 0   spironolactone  (ALDACTONE ) 100 MG tablet, Take 1 tablet (100 mg total) by mouth at bedtime. (Patient not taking: Reported on 05/27/2024), Disp: 30 tablet, Rfl: 5   tretinoin  (RETIN-A ) 0.025 % cream, Apply topically at bedtime. Apply pea size amount to face on Tu/Th nights. In 2 weeks increase to 3 nights a week on MWF., Disp: 45 g, Rfl: 2   triamcinolone  ointment (KENALOG ) 0.5 %, Apply 1 Application topically 2 (two) times daily. For moderate to severe eczema.  Do not use for more than 1 week at a time., Disp: 60 g, Rfl: 3   No Known Allergies  Past Medical History:  Diagnosis Date   Allergy     Atopic dermatitis 09/25/2020   Breast skin changes 12/12/2016   Fatigue 02/01/2017   Genital warts    Low thyroid  stimulating hormone (TSH) level 02/01/2017   Palpitations 07/18/2017   Shoulder pain, bilateral 01/06/2015      Past Surgical History:  Procedure Laterality Date   LASER ABLATION OF CONDYLOMAS      Family History  Problem Relation Age of Onset   Drug abuse Father    Cancer Paternal Aunt        leukemia   Hypertension Maternal Aunt    Stroke Other        unknown   Cirrhosis Maternal Grandmother    Urticaria Brother    Eczema Son    Diabetes Neg Hx    Heart disease Neg Hx    Allergic rhinitis Neg Hx    Asthma Neg Hx     Social History   Tobacco Use   Smoking status: Never    Passive exposure: Never   Smokeless tobacco: Never  Vaping Use   Vaping status: Never Used  Substance Use Topics   Alcohol use: No    Alcohol/week: 0.0 standard drinks of alcohol   Drug use: No    ROS Refer to HPI for ROS details.  Objective:   Vitals: BP 119/78 (BP Location: Right Arm)   Pulse 71   Temp 97.8 F (36.6 C) (Oral)   Resp 16   LMP 05/23/2024   SpO2 100%   Physical Exam Vitals and nursing note reviewed.  Constitutional:      General: She is not in acute distress.    Appearance: She is well-developed and normal weight. She is not ill-appearing, toxic-appearing or diaphoretic.  HENT:     Head: Normocephalic and atraumatic.     Mouth/Throat:  Mouth: Mucous membranes are moist.  Cardiovascular:     Rate and Rhythm: Normal rate.  Pulmonary:     Effort: Pulmonary effort is normal. No respiratory distress.     Breath sounds: No stridor. No wheezing.  Abdominal:     General: There is distension.     Palpations: Abdomen is soft.     Tenderness: There is abdominal tenderness in the left lower quadrant. There is no right CVA tenderness or left CVA tenderness.  Musculoskeletal:        General: Normal range of motion.  Skin:    General: Skin is warm and dry.  Neurological:     General: No focal deficit present.     Mental Status: She is alert and oriented to person, place, and time.  Psychiatric:        Mood and Affect: Mood normal.        Behavior: Behavior normal.      Procedures  Results for orders placed or performed during the hospital encounter of 06/04/24 (from the past 24 hours)  POCT urine pregnancy     Status: None   Collection Time: 06/04/24  6:36 PM  Result Value Ref Range   Preg Test, Ur Negative Negative  POCT URINE DIPSTICK     Status: Abnormal   Collection Time: 06/04/24  6:37 PM  Result Value Ref Range   Color, UA yellow yellow   Clarity, UA clear clear   Glucose, UA negative negative mg/dL   Bilirubin, UA negative negative   Ketones, POC UA trace (5) (A) negative mg/dL   Spec Grav, UA 8.979 8.989 - 1.025   Blood, UA small (A) negative   pH, UA 7.5 5.0 - 8.0   POC PROTEIN,UA trace negative, trace   Urobilinogen, UA 1.0 0.2 or 1.0 E.U./dL   Nitrite, UA Negative Negative   Leukocytes, UA Negative Negative    No results found.   Assessment and Plan :     Discharge Instructions       1. Abdominal pain, left lower quadrant (Primary) - POCT URINE DIPSTICK completed in UC shows small blood, no leukocytes, no nitrite, these findings are not typically indicative of urinary tract infection. - POCT urine pregnancy completed in UC is negative for pregnancy - Based on progression of symptoms to severe left lower quadrant abdominal pain possible concern for ovarian torsion versus ovarian cyst.  Evaluation may require advanced imaging and stat laboratory testing which are unavailable in urgent care. - Please go directly to the emergency department for further evaluation and treatment.      Shandel Busic B Gerardo Caiazzo   Lorielle Boehning, [place], [place] 06/04/24 [phone]

## 2024-06-04 NOTE — ED Triage Notes (Addendum)
 Pt c/o lower left back and abdominal pain on a off since 04/23/24. She was test at [facility] today due to worsening pain. States it hurts to touch abdomin.

## 2024-06-04 NOTE — Discharge Instructions (Signed)
 Return to ED if you experience sharp debilitating abdominal pain, intractable vomiting causing you to be unable to keep fluids down, inability to have bowel movement for >5days especially if not passing gas

## 2024-06-04 NOTE — ED Notes (Signed)
 Patient is being discharged from the Urgent Care and sent to the Emergency Department via POV . Per Ethel Aguas, NP, patient is in need of higher level of care due to worsening severe abdominal and back pain. Patient is aware and verbalizes understanding of plan of care.  Vitals:   06/04/24 1829  BP: 119/78  Pulse: 71  Resp: 16  Temp: 97.8 F (36.6 C)  SpO2: 100%

## 2024-06-04 NOTE — ED Provider Notes (Signed)
 [place] EMERGENCY DEPARTMENT AT [facility] Provider Note   CSN: [id number] Arrival date & time: 06/04/24  8090     Patient presents with: Abdominal Pain   Caroline Jensen is a 38 y.o. female with past medical history of hypothyroidism, uterine fibroid presents to Emergency Department for evaluation of left lower back pain and LLQ abdominal pain.  Reports that initially had left lower back pain that started 2 weeks ago then had left lower quadrant abdominal pain that started 2 days ago.  Has not tried any OTC medications prior to arrival.  Last BM was today normal.  Has had mild nausea but no NVD.  No history of kidney stones.  LMP 05/23/2024 and due to have her menses in 1 week.  Denies urinary symptoms, fevers, known traumatic injury, urinary incontinence, saddle paresthesia, IVDU, malignancy     Abdominal Pain      Prior to Admission medications   Medication Sig Start Date End Date Taking? Authorizing Provider  clindamycin  (CLEOCIN  T) 1 % SWAB Apply 1 Application topically in the morning. 04/08/24   Alm Delon SAILOR, DO  ferrous sulfate  325 (65 FE) MG tablet Take 1 tablet (325 mg total) by mouth every other day. Patient not taking: Reported on 05/27/2024 10/30/23   Alba Sharper, MD  spironolactone  (ALDACTONE ) 100 MG tablet Take 1 tablet (100 mg total) by mouth at bedtime. Patient not taking: Reported on 05/27/2024 04/08/24   Alm Delon SAILOR, DO  tretinoin  (RETIN-A ) 0.025 % cream Apply topically at bedtime. Apply pea size amount to face on Tu/Th nights. In 2 weeks increase to 3 nights a week on MWF. 04/08/24 04/08/25  Alm Delon SAILOR, DO  triamcinolone  ointment (KENALOG ) 0.5 % Apply 1 Application topically 2 (two) times daily. For moderate to severe eczema.  Do not use for more than 1 week at a time. 08/11/22   Lilland, Alana, DO    Allergies: Patient has no known allergies.    Review of Systems  Gastrointestinal:  Positive for abdominal pain.    Updated Vital Signs BP  111/73 (BP Location: Left Arm)   Pulse 64   Temp 98.2 F (36.8 C) (Oral)   Resp 19   Ht 5' 2 (1.575 m)   Wt 50.8 kg   LMP 05/23/2024   SpO2 98%   BMI 20.49 kg/m   Physical Exam Vitals and nursing note reviewed.  Constitutional:      General: She is not in acute distress.    Appearance: Normal appearance.  HENT:     Head: Normocephalic and atraumatic.  Eyes:     Conjunctiva/sclera: Conjunctivae normal.  Cardiovascular:     Rate and Rhythm: Normal rate.  Pulmonary:     Effort: Pulmonary effort is normal. No respiratory distress.     Breath sounds: Normal breath sounds.  Abdominal:     General: Bowel sounds are normal.     Tenderness: There is abdominal tenderness in the suprapubic area and left lower quadrant. There is no right CVA tenderness, left CVA tenderness, guarding or rebound.  Musculoskeletal:     Cervical back: No tenderness or bony tenderness.     Thoracic back: No tenderness or bony tenderness.     Lumbar back: No tenderness or bony tenderness.  Skin:    Coloration: Skin is not jaundiced or pale.  Neurological:     Mental Status: She is alert. Mental status is at baseline.     (all labs ordered are listed, but only abnormal results are  displayed) Labs Reviewed  CBC - Abnormal; Notable for the following components:      Result Value   RDW 16.8 (*)    All other components within normal limits  URINALYSIS, ROUTINE W REFLEX MICROSCOPIC - Abnormal; Notable for the following components:   Color, Urine STRAW (*)    Hgb urine dipstick SMALL (*)    Ketones, ur 5 (*)    Bacteria, UA RARE (*)    All other components within normal limits  LIPASE, BLOOD  COMPREHENSIVE METABOLIC PANEL WITH GFR  HCG, SERUM, QUALITATIVE    EKG: None  Radiology: CT ABDOMEN PELVIS W CONTRAST Result Date: 06/04/2024 CLINICAL DATA:  Left lower back and abdominal pain intermittently since 04/23/2024 EXAM: CT ABDOMEN AND PELVIS WITH CONTRAST TECHNIQUE: Multidetector CT imaging of  the abdomen and pelvis was performed using the standard protocol following bolus administration of intravenous contrast. RADIATION DOSE REDUCTION: This exam was performed according to the departmental dose-optimization program which includes automated exposure control, adjustment of the mA and/or kV according to patient size and/or use of iterative reconstruction technique. CONTRAST:  OMNIPAQUE  IOHEXOL  300 MG/ML  SOLN COMPARISON:  06/04/2024, 02/22/2020 FINDINGS: Lower chest: No acute pleural or parenchymal lung disease. Hepatobiliary: No focal liver abnormality is seen. No gallstones, gallbladder wall thickening, or biliary dilatation. Pancreas: Unremarkable. No pancreatic ductal dilatation or surrounding inflammatory changes. Spleen: Normal in size without focal abnormality. Adrenals/Urinary Tract: Kidneys enhance normally and symmetrically. No urinary tract calculi or obstruction. The adrenals are unremarkable. The bladder is grossly normal. Stomach/Bowel: No bowel obstruction or ileus. Moderate stool throughout the colon. Normal retrocecal appendix. No bowel wall thickening or inflammatory change. Vascular/Lymphatic: No significant vascular findings are present. No enlarged abdominal or pelvic lymph nodes. Reproductive: Multiple fibroids, largest in the uterine fundus measuring 6.5 x 5.3 x 4.3 cm. Normal follicles within the left ovary. No adnexal masses. Other: Trace pelvic free fluid. No free intraperitoneal gas. No abdominal wall hernia. Musculoskeletal: No acute or destructive bony abnormalities. Reconstructed images demonstrate no additional findings. IMPRESSION: 1. No acute intra-abdominal or intrapelvic process. Normal appendix. 2. Moderate stool throughout the colon consistent constipation. No bowel obstruction or ileus. 3. Fibroid uterus. 4. Trace pelvic free fluid, likely physiologic. Electronically Signed   By: Ozell Daring M.D.   On: 06/04/2024 22:03   US  Pelvis Complete Result Date:  06/04/2024 CLINICAL DATA:  Pelvic pain for 1 day EXAM: TRANSABDOMINAL AND TRANSVAGINAL ULTRASOUND OF PELVIS DOPPLER ULTRASOUND OF OVARIES TECHNIQUE: Both transabdominal and transvaginal ultrasound examinations of the pelvis were performed. Transabdominal technique was performed for global imaging of the pelvis including uterus, ovaries, adnexal regions, and pelvic cul-de-sac. It was necessary to proceed with endovaginal exam following the transabdominal exam to visualize the endometrium and adnexal structures. Color and duplex Doppler ultrasound was utilized to evaluate blood flow to the ovaries. COMPARISON:  02/22/2020 FINDINGS: Uterus Measurements: 11.1 x 7.2 by 8.3 cm = volume: 346.7 mL. 5.9 x 6.0 x 5.5 cm intramural uterine fibroid. Endometrium Thickness: 13 mm.  No focal abnormality visualized. Right ovary Measurements: 4.3 x 2.1 by 2.3 cm = volume: 11.2 mL. Normal appearance/no adnexal mass. Left ovary Measurements: 4.5 x 2.8 x 2.9 cm = volume: 19.1 mL. Normal appearance/no adnexal mass. Pulsed Doppler evaluation of both ovaries demonstrates normal low-resistance arterial and venous waveforms. Other findings Trace pelvic free fluid, likely physiologic. IMPRESSION: 1. Trace pelvic free fluid, likely physiologic. 2. Otherwise unremarkable pelvic ultrasound.  No acute findings. Electronically Signed   By: Ozell Daring  M.D.   On: 06/04/2024 20:57   US  Transvaginal Non-OB Result Date: 06/04/2024 CLINICAL DATA:  Pelvic pain for 1 day EXAM: TRANSABDOMINAL AND TRANSVAGINAL ULTRASOUND OF PELVIS DOPPLER ULTRASOUND OF OVARIES TECHNIQUE: Both transabdominal and transvaginal ultrasound examinations of the pelvis were performed. Transabdominal technique was performed for global imaging of the pelvis including uterus, ovaries, adnexal regions, and pelvic cul-de-sac. It was necessary to proceed with endovaginal exam following the transabdominal exam to visualize the endometrium and adnexal structures. Color and duplex  Doppler ultrasound was utilized to evaluate blood flow to the ovaries. COMPARISON:  02/22/2020 FINDINGS: Uterus Measurements: 11.1 x 7.2 by 8.3 cm = volume: 346.7 mL. 5.9 x 6.0 x 5.5 cm intramural uterine fibroid. Endometrium Thickness: 13 mm.  No focal abnormality visualized. Right ovary Measurements: 4.3 x 2.1 by 2.3 cm = volume: 11.2 mL. Normal appearance/no adnexal mass. Left ovary Measurements: 4.5 x 2.8 x 2.9 cm = volume: 19.1 mL. Normal appearance/no adnexal mass. Pulsed Doppler evaluation of both ovaries demonstrates normal low-resistance arterial and venous waveforms. Other findings Trace pelvic free fluid, likely physiologic. IMPRESSION: 1. Trace pelvic free fluid, likely physiologic. 2. Otherwise unremarkable pelvic ultrasound.  No acute findings. Electronically Signed   By: Ozell Daring M.D.   On: 06/04/2024 20:57   US  Art/Ven Flow Abd Pelv Doppler Result Date: 06/04/2024 CLINICAL DATA:  Pelvic pain for 1 day EXAM: TRANSABDOMINAL AND TRANSVAGINAL ULTRASOUND OF PELVIS DOPPLER ULTRASOUND OF OVARIES TECHNIQUE: Both transabdominal and transvaginal ultrasound examinations of the pelvis were performed. Transabdominal technique was performed for global imaging of the pelvis including uterus, ovaries, adnexal regions, and pelvic cul-de-sac. It was necessary to proceed with endovaginal exam following the transabdominal exam to visualize the endometrium and adnexal structures. Color and duplex Doppler ultrasound was utilized to evaluate blood flow to the ovaries. COMPARISON:  02/22/2020 FINDINGS: Uterus Measurements: 11.1 x 7.2 by 8.3 cm = volume: 346.7 mL. 5.9 x 6.0 x 5.5 cm intramural uterine fibroid. Endometrium Thickness: 13 mm.  No focal abnormality visualized. Right ovary Measurements: 4.3 x 2.1 by 2.3 cm = volume: 11.2 mL. Normal appearance/no adnexal mass. Left ovary Measurements: 4.5 x 2.8 x 2.9 cm = volume: 19.1 mL. Normal appearance/no adnexal mass. Pulsed Doppler evaluation of both ovaries  demonstrates normal low-resistance arterial and venous waveforms. Other findings Trace pelvic free fluid, likely physiologic. IMPRESSION: 1. Trace pelvic free fluid, likely physiologic. 2. Otherwise unremarkable pelvic ultrasound.  No acute findings. Electronically Signed   By: Ozell Daring M.D.   On: 06/04/2024 20:57    Medications Ordered in the ED  iohexol  (OMNIPAQUE ) 300 MG/ML solution 100 mL (100 mLs Intravenous Contrast Given 06/04/24 2146)                                    Medical Decision Making Amount and/or Complexity of Data Reviewed Labs: ordered. Radiology: ordered.  Risk Prescription drug management.   Patient presents to the ED for concern of abdominal pain, this involves an extensive number of treatment options, and is a complaint that carries with it a high risk of complications and morbidity.  The differential diagnosis includes obstruction, perforation, appendicitis, colitis, gastroenteritis, IBS, kidney stone, UTI, pyelonephritis, pancreatitis, gallbladder pathology, cyst, torsion.  Not an exhaustive list   Co morbidities that complicate the patient evaluation  See HPI   Additional history obtained:  Additional history obtained from. Nursing   External records from outside source obtained and reviewed  including triage RN note   Lab Tests:  I Ordered, and personally interpreted labs.  The pertinent results include:   UA is contaminated but does not appear infectious.  Does have small Hgb hCG negative   Imaging Studies ordered:  I ordered imaging studies including CT abdomen pelvis with contrast, TVUS with Doppler I independently visualized and interpreted imaging which showed  Fibroid uterus Moderate constipation No other acute findings I agree with the radiologist interpretation     Problem List / ED Course:  LLQ abdominal pain Left lower back pain I did offer analgesia however patient refuses as she does not take medicine and pain is  bearable Lab work unremarkable.  hCG negative.   UA without infection but does show small Hgb which could signify early menses, recently passed kidney stone.  Patient has no history of kidney stone but based on pain being intermittent starting in the back and progressing to left lower quadrant and suprapubic region over the past couple days it could be a recently passed kidney stone that was not picked up by CT CT is notable for moderate stool burden in colon with no obstruction or perforation and fibroid uterus TVUS without torsion With no urinary symptoms nor complaints of fever, low suspicion for pyelo or UTI As she has been passing gas and bowel movements although she does have some constipation does not appear to be obstructed.  Discussed taking Metamucil or having a higher fiber diet, ensuring adequate hydration, MiraLAX 2 times a day until she has a full bowel movement for constipation I also discussed that she does have a fibroid uterus on CT and multiple fibroids in her uterus, she should follow-up with GYN regarding this Passes p.o. challenge Do not see emergent etiology of symptoms at this time.  Symptoms could be related to recently passed kidney stone, fibroid uterus, constipation.  She is hemodynamically stable with no fever nor tachycardia.  Will have patient follow-up with GYN and gastroenterology for further workup of her symptoms.  Provided gastroenterology follow-up on DC paperwork    Reevaluation:  After the interventions noted above, I reevaluated the patient and found that they have :stayed the same     Dispostion:  After consideration of the diagnostic results and the patients response to treatment, I feel that the patent would benefit from outpatient management with gastroenterology follow-up, GYN follow-up, symptomatic care.   Discussed ED workup, disposition, return to ED precautions with patient who expresses understanding agrees with plan.  All questions answered to  their satisfaction.  They are agreeable to plan.  Discharge instructions provided on paperwork  Final diagnoses:  Abdominal pain, unspecified abdominal location    ED Discharge Orders     None        Minnie Tinnie BRAVO, PA 06/05/24 0001    Tegeler, Lonni PARAS, MD 06/10/24 1321

## 2024-06-04 NOTE — Discharge Instructions (Signed)
  1. Abdominal pain, left lower quadrant (Primary) - POCT URINE DIPSTICK completed in UC shows small blood, no leukocytes, no nitrite, these findings are not typically indicative of urinary tract infection. - POCT urine pregnancy completed in UC is negative for pregnancy - Based on progression of symptoms to severe left lower quadrant abdominal pain possible concern for ovarian torsion versus ovarian cyst.  Evaluation may require advanced imaging and stat laboratory testing which are unavailable in urgent care. - Please go directly to the emergency department for further evaluation and treatment.

## 2024-06-04 NOTE — ED Triage Notes (Signed)
 Pt reports left lower abdominal pain that started this morning that has become worse through the day. Nausea. Pt reports she has been having left lower back pain for about a month, but the pain is worse today in her back. Went to UC prior and sent here for further eval.

## 2024-07-01 ENCOUNTER — Ambulatory Visit: Admitting: Dermatology

## 2024-07-17 DIAGNOSIS — K5900 Constipation, unspecified: Secondary | ICD-10-CM | POA: Diagnosis not present

## 2024-07-17 DIAGNOSIS — R6881 Early satiety: Secondary | ICD-10-CM | POA: Diagnosis not present

## 2024-07-17 DIAGNOSIS — R112 Nausea with vomiting, unspecified: Secondary | ICD-10-CM | POA: Diagnosis not present

## 2024-07-17 DIAGNOSIS — K921 Melena: Secondary | ICD-10-CM | POA: Diagnosis not present

## 2024-07-17 DIAGNOSIS — D509 Iron deficiency anemia, unspecified: Secondary | ICD-10-CM | POA: Diagnosis not present

## 2024-08-07 ENCOUNTER — Ambulatory Visit

## 2024-08-07 VITALS — BP 98/56 | HR 78 | Wt 114.0 lb

## 2024-08-07 DIAGNOSIS — J069 Acute upper respiratory infection, unspecified: Secondary | ICD-10-CM

## 2024-08-07 LAB — POC SOFIA 2 FLU + SARS ANTIGEN FIA
Influenza A, POC: NEGATIVE
Influenza B, POC: NEGATIVE
SARS Coronavirus 2 Ag: NEGATIVE

## 2024-08-07 NOTE — Patient Instructions (Addendum)
 It was wonderful to see you today!  We swabbed you for covid and flu today. Because you are on day 5 of illness you are outside the window for any of the antivirals, so this is for diagnostic purposes only.  Because you are an adult it is safe for you to take any over-the-counter cold and flu remedies that you feel comfortable with, just make sure that you are reading the label to avoid accidentally double dosing on Tylenol  as many cold and flu remedies contain Tylenol  as a fever and pain reducer.  Please call [phone] with any questions about today's appointment.   If you need any additional refills, please call your pharmacy before calling the office.  Lucie Pinal, DO Family Medicine

## 2024-08-07 NOTE — Progress Notes (Signed)
    SUBJECTIVE:   CHIEF COMPLAINT / HPI:   Cough, nasal congestion and sore throat since Sunday.  Subjective fevers but no measured fevers.  Has been out of work since Monday due to her symptoms.  Requesting work note.  PERTINENT  PMH / PSH: Noncontributory  OBJECTIVE:   BP (!) 98/56   Pulse 78   Wt 114 lb (51.7 kg)   LMP 07/16/2024   SpO2 96%   BMI 20.85 kg/m   General: A&O, NAD HEENT: No sign of trauma, EOM grossly intact Cardiac: RRR, no m/r/g Respiratory: CTAB, normal WOB, no w/c/r  ASSESSMENT/PLAN:   Assessment & Plan Viral URI with cough - Sofia swab for COVID and flu for diagnostic purposes only, patient is outside of window for treatment -Supportive care recommendations provided -Work note provided -Will contact with results of swabs if relevant   Caroline Pinal, DO [facility]

## 2024-09-02 LAB — HM COLONOSCOPY

## 2024-09-11 ENCOUNTER — Encounter (INDEPENDENT_AMBULATORY_CARE_PROVIDER_SITE_OTHER): Payer: Self-pay

## 2024-09-11 ENCOUNTER — Ambulatory Visit (INDEPENDENT_AMBULATORY_CARE_PROVIDER_SITE_OTHER)

## 2024-09-11 VITALS — BP 96/61 | HR 56 | Wt 114.0 lb

## 2024-09-11 DIAGNOSIS — J309 Allergic rhinitis, unspecified: Secondary | ICD-10-CM

## 2024-09-11 DIAGNOSIS — J029 Acute pharyngitis, unspecified: Secondary | ICD-10-CM | POA: Diagnosis not present

## 2024-09-11 DIAGNOSIS — J343 Hypertrophy of nasal turbinates: Secondary | ICD-10-CM

## 2024-09-11 MED ORDER — FLUTICASONE PROPIONATE 50 MCG/ACT NA SUSP: 2.0000 | Freq: Every day | NASAL | 6 refills | Status: AC

## 2024-09-16 NOTE — Progress Notes (Signed)
 Dear Dr. Janna, Here is my assessment for our mutual patient, Caroline Jensen. Thank you for allowing me the opportunity to care for your patient. Please do not hesitate to contact me should you have any other questions. Sincerely, Dr. Penne Croak  Otolaryngology Clinic Note Referring provider: Dr. Janna HPI:  Discussed the use of AI scribe software for clinical note transcription with the patient, who gave verbal consent to proceed.  History of Present Illness Caroline Jensen is a 39 year old female with allergic rhinitis who presents for evaluation of a newly discovered laryngeal cyst.  Laryngeal mass and throat symptoms - Laryngeal mass identified last week during endoscopy and colonoscopy; image unavailable for review but she is attempting to obtain it - Mild throat soreness present, uncertain if related to weather - No significant odynophagia, dysphagia, or changes in voice  Nasal congestion and mucus production - Chronic nasal congestion and drainage, predominantly in the mornings - Symptoms improve after showering - Frequent expectoration of mucus from both nasal and oral sources - No use of nasal sprays - Occasional use of Zyrtec   Substance use - Does not smoke - Rarely consumes alcohol   Independent Review of Additional Tests or Records:  Reviewed external note from referring PCP, Gomes,describing relevant history incorporated into todays evaluation.   PMH/Meds/All/SocHx/FamHx/ROS:   Past Medical History:  Diagnosis Date   Allergy     Atopic dermatitis 09/25/2020   Breast skin changes 12/12/2016   Fatigue 02/01/2017   Genital warts    Low thyroid  stimulating hormone (TSH) level 02/01/2017   Palpitations 07/18/2017   Shoulder pain, bilateral 01/06/2015     Past Surgical History:  Procedure Laterality Date   LASER ABLATION OF CONDYLOMAS      Family History  Problem Relation Age of Onset   Drug abuse Father    Cancer Paternal Aunt        leukemia    Hypertension Maternal Aunt    Stroke Other        unknown   Cirrhosis Maternal Grandmother    Urticaria Brother    Eczema Son    Diabetes Neg Hx    Heart disease Neg Hx    Allergic rhinitis Neg Hx    Asthma Neg Hx      Social Connections: Moderately Integrated (04/25/2023)   Social Connection and Isolation Panel    Frequency of Communication with Friends and Family: More than three times a week    Frequency of Social Gatherings with Friends and Family: Once a week    Attends Religious Services: More than 4 times per year    Active Member of [employer] or Organizations: Yes    Attends Engineer, Structural: More than 4 times per year    Marital Status: Never married     Current Medications[1]   Physical Exam:   BP 96/61 (BP Location: Left Arm, Patient Position: Sitting, Cuff Size: Normal)   Pulse (!) 56   Wt 114 lb (51.7 kg)   SpO2 98%   BMI 20.85 kg/m   The patient was awake, alert, and appropriate. The external ears were inspected, and otoscopy was performed to evaluate the external auditory canals and tympanic membranes. The nasal cavity and septum were examined for mucosal changes, obstruction, or discharge. The oral cavity and oropharynx were inspected for mucosal lesions, infection, or tonsillar hypertrophy. The neck was palpated for lymphadenopathy, thyroid  abnormalities, or other masses. Cranial nerve function was grossly intact.  Pertinent Findings: General: Well developed, well nourished. No acute  distress. Voice without hoarseness Head/Face: Normocephalic. No sinus tenderness. Facial nerve intact and equal bilaterally. No facial lacerations. Eyes: PERRL, no scleral icterus or conjunctival hemorrhage. EOMI. Ears: No gross deformity. Normal external canal. Tympanic membrane with normal landmarks bilaterally Hearing: Normal speech reception.  Nose: No gross deformity or lesions. No purulent discharge. turbinate hypertrophy.  Mouth/Oropharynx: Lips without any  lesions. Dentition good.  No mucosal lesions within the oropharynx. No tonsillar enlargement, exudate, or lesions. Pharyngeal walls symmetrical. Uvula midline. Tongue midline without lesions. Larynx: See TFL if applicable Nasopharynx: See TFL if applicable Neck: Trachea midline. No masses. No thyromegaly or nodules palpated. No crepitus. Lymphatic: No lymphadenopathy in the neck. Respiratory: No stridor or distress. Room air. Cardiovascular: Regular rate and rhythm. Extremities: No edema or cyanosis. Warm and well-perfused. Skin: No scars or lesions on face or neck. Neurologic: CN II-XII grossly intact. Moving all extremities without gross abnormality. Other:  Physical Exam HEENT: External ears normal. Nasal mucosa swollen. Tonsils present. NECK: Normal.  Seprately Identifiable Procedures:  I personally ordered, reviewed and interpreted the following with the patient today  Given the patient's symptoms and incomplete visualization of critical sinonasal areas with anterior rhinoscopy, a separately performed diagnostic nasal endoscopy procedure is indicated for a complete rhinologic evaluation per American Rhinologic Society recommendations (https://www.american-rhinologic.org/position-statements)  I personally ordered, reviewed and interpreted the following with the patient today  Procedure Note Diagnostic Nasal Endoscopy CPT CODE -- 68768 - Mod 25  Prior to initiating any procedures, risks/benefits/alternatives were explained to the patient and verbal consent obtained.  Pre-procedure diagnosis: Concern for obstruction and mass Post-procedure diagnosis: same Indication: See pre-procedure diagnosis and physical exam above Complications: None apparent EBL: 0 mL Anesthesia: Lidocaine 4% and topical decongestant was topically sprayed in each nasal cavity  Description of Procedure:  Patient was identified. A flexible fiberoptic endoscope was utilized to evaluate the sinonasal cavities,  mucosa, sinus ostia and turbinates and septum.  Overall, signs of mucosal inflammation are noted.  Also noted are inferior turbinate edema.  No mucopurulence, polyps, or masses noted.   Right Middle meatus: clear Right SE Recess: clear Left MM: clear Left SE Recess: clear Photodocumentation was obtained.  Procedure Note Pre-procedure diagnosis:  sore throat Post-procedure diagnosis: Same Procedure: Transnasal Fiberoptic Laryngoscopy, CPT 31575 - Mod 25 Indication: sore throat and GI specialist noted piriform sinus mass on egd Complications: None apparent EBL: 0 mL  The procedure was undertaken to further evaluate the patient's complaint of sore throat, with mirror exam inadequate for appropriate examination due to gag reflex and poor patient tolerance  Procedure:  Patient was identified as correct patient. Verbal consent was obtained. The nose was sprayed with oxymetazoline and 4% lidocaine. The The flexible laryngoscope was passed through the nose to view the nasal cavity, pharynx (oropharynx, hypopharynx) and larynx.  The larynx was examined at rest and during multiple phonatory tasks. Documentation was obtained and reviewed with patient. The scope was removed. The patient tolerated the procedure well.  Findings: The nasal cavity and nasopharynx did not reveal any masses or lesions, mucosa appeared to be without obvious lesions. The tongue base, pharyngeal walls, piriform sinuses, vallecula, epiglottis and postcricoid region are normal in appearance EXCEPT: small amount of lymphoid tissue in piriform sinus. The visualized portion of the subglottis and proximal trachea is widely patent. The vocal folds are mobile bilaterally. There are no lesions on the free edge of the vocal folds nor elsewhere in the larynx worrisome for malignancy.  Impression & Plans:  Caroline Jensen is a 39 y.o. female  1. Allergic rhinitis, unspecified seasonality, unspecified trigger     -  Findings and diagnoses discussed in detail with the patient. - Risks, benefits, and alternatives were reviewed. Through shared decision making, the patient elects to proceed with below. Assessment & Plan Bilateral lesions in the piriform sinus consistent with benign lymphoid tissue. Asymptomatic with no high-risk features, supporting benign etiology. Symmetric. - Reviewed endoscopic and flexible laryngoscopy findings, documenting bilateral lesions likely benign. - Recommended observation with follow-up in two months to monitor for interval change. - Advised that biopsy will be considered if lesions enlarge or if concerning symptoms develop.  Allergic rhinitis Chronic nasal congestion and postnasal drainage likely due to allergic rhinitis, with marked nasal mucosal edema observed. - Discussed diagnosis of allergic rhinitis and the role of intranasal corticosteroid therapy. - Prescribed daily intranasal corticosteroid spray, instructing on proper technique (directing spray laterally). - Educated that symptomatic improvement may require several weeks and that nasal dryness is an expected side effect. - Arranged follow-up in two months to assess response to therapy.  - Orders placed: No orders of the defined types were placed in this encounter.  - Medications prescribed/continued/adjusted:  Meds ordered this encounter  Medications   fluticasone  (FLONASE ) 50 MCG/ACT nasal spray    Sig: Place 2 sprays into both nostrils daily.    Dispense:  16 g    Refill:  6   - Education materials provided to the patient. - Follow up: 2 months. Patient instructed to return sooner or go to the ED if new/worsening symptoms develop.   Thank you for allowing me the opportunity to care for your patient. Please do not hesitate to contact me should you have any other questions.  Sincerely, Penne Croak, DO Otolaryngologist (ENT) [facility] Phone: [phone] Fax:  [phone]  09/16/2024, 6:34 PM        [1]  Current Outpatient Medications:    clindamycin  (CLEOCIN  T) 1 % SWAB, Apply 1 Application topically in the morning., Disp: 60 each, Rfl: 5   ferrous sulfate  325 (65 FE) MG tablet, Take 1 tablet (325 mg total) by mouth every other day., Disp: 45 tablet, Rfl: 0   fluticasone  (FLONASE ) 50 MCG/ACT nasal spray, Place 2 sprays into both nostrils daily., Disp: 16 g, Rfl: 6   pantoprazole (PROTONIX) 40 MG tablet, Take 40 mg by mouth every morning., Disp: , Rfl:    spironolactone  (ALDACTONE ) 100 MG tablet, Take 1 tablet (100 mg total) by mouth at bedtime., Disp: 30 tablet, Rfl: 5   tretinoin  (RETIN-A ) 0.025 % cream, Apply topically at bedtime. Apply pea size amount to face on Tu/Th nights. In 2 weeks increase to 3 nights a week on MWF., Disp: 45 g, Rfl: 2   triamcinolone  ointment (KENALOG ) 0.5 %, Apply 1 Application topically 2 (two) times daily. For moderate to severe eczema.  Do not use for more than 1 week at a time., Disp: 60 g, Rfl: 3

## 2024-09-19 ENCOUNTER — Other Ambulatory Visit (HOSPITAL_COMMUNITY): Payer: Self-pay | Admitting: Gastroenterology

## 2024-09-19 DIAGNOSIS — R112 Nausea with vomiting, unspecified: Secondary | ICD-10-CM

## 2024-09-29 ENCOUNTER — Ambulatory Visit (HOSPITAL_COMMUNITY)

## 2024-09-29 ENCOUNTER — Ambulatory Visit (HOSPITAL_COMMUNITY)
Admission: RE | Admit: 2024-09-29 | Discharge: 2024-09-29 | Disposition: A | Discharge status: Home / Self Care | Source: Ambulatory Visit | Attending: Gastroenterology | Admitting: Gastroenterology

## 2024-09-29 DIAGNOSIS — R112 Nausea with vomiting, unspecified: Secondary | ICD-10-CM | POA: Insufficient documentation

## 2024-09-29 MED ORDER — TECHNETIUM TC 99M MEBROFENIN IV KIT
5.2000 | PACK | Freq: Once | INTRAVENOUS | Status: AC | PRN
  Administered 2024-09-29: 5.2 via INTRAVENOUS

## 2024-10-09 ENCOUNTER — Ambulatory Visit: Admitting: Dermatology

## 2024-10-09 ENCOUNTER — Encounter: Payer: Self-pay | Admitting: Dermatology

## 2024-10-09 VITALS — BP 103/66 | HR 72

## 2024-10-09 DIAGNOSIS — L810 Postinflammatory hyperpigmentation: Secondary | ICD-10-CM

## 2024-10-09 DIAGNOSIS — L700 Acne vulgaris: Secondary | ICD-10-CM

## 2024-10-09 MED ORDER — CLINDAMYCIN PHOSPHATE 1 % EX SWAB: 1.0000 | Freq: Every morning | CUTANEOUS | 9 refills | Status: AC

## 2024-10-09 MED ORDER — TRETINOIN 0.05 % EX CREA: TOPICAL_CREAM | Freq: Every day | CUTANEOUS | 2 refills | Status: AC

## 2024-10-09 MED ORDER — AZELAIC ACID 15 % EX GEL: 1.0000 | Freq: Two times a day (BID) | CUTANEOUS | 9 refills | Status: AC

## 2024-10-09 NOTE — Progress Notes (Signed)
" ° °  Follow-Up Visit   Subjective  Caroline Jensen is a 39 y.o. female who presents for the following: Acne  Patient present today for follow up visit for Acne. Patient was last evaluated on 04/2024. At this visit patient was prescribed spironolactone  100 mg PO at bedtime,Clindamycin  swabs to affected areas after washing in the am, followed by Centella sunscreen/moisturizer & Tretinoin  0.025% cream. She was also recommended to apply Eucerin Radiant Tone Dual Serum to help with PIH from previous Acne Flares.   Patient's current regimen consist of washing 2 times daily with CeraVe Hydrating Cream to foam Cleanser. Followed by clindamycin  swabs and Centella Moisturizer. At night she applies Tretinoin  0.025% 3 Nights Weekly. She has some dryness. If she does she will add extra moisturizer and Aquaphor. She applied Eucerin Radiant Tone once daily. She stopped taking Spironolactone  because it was causing her to urinate excessively during the night.  Patient reports sxs are improving but not at goal. Patient reports medication changes (D/Cd Spironolactone ).  Patient provided verbal consent for the use of an AI-assisted program to generate a detailed after-visit summary. The patient understands that the AI tool is used to support clinical documentation and that all information will be reviewed and verified by the healthcare provider.  Patient reports she is not actively pregnant, trying to conceive or nursing.   The following portions of the chart were reviewed this encounter and updated as appropriate: medications, allergies, medical history  Review of Systems:  No other skin or systemic complaints except as noted in HPI or Assessment and Plan.  Objective  Well appearing patient in no apparent distress; mood and affect are within normal limits.  A focused examination was performed of the following areas: Face  Relevant exam findings are noted in the Assessment and Plan.    Assessment & Plan   Acne  vulgaris and PIH Intermittent flares, particularly during menstrual cycle. Previous use of spironolactone  was discontinued. Current management includes topical treatments. Azelaic acid  added to regimen to prevent flares and address dark spots. Tretinoin  dosage to be increased in spring to enhance efficacy. Moisturizers recommended to manage dryness associated with tretinoin  use.  - Added azelaic acid  to regimen with clindamycin  in the morning and with tretinoin  at night. - Increased tretinoin  to 0.05% for use starting in April or May. - Provided samples of Avene Ciclophate and La Roche-Posay Cicloplast for nighttime use to manage dryness. - Use Eucerin Radiant Tone before tretinoin  application to create a hydrating base. - Monitor for signs of irritation; discontinue tretinoin  if stinging, burning, or irritation occurs. - Sent new prescription for clindamycin  with five refills. - Advised to use [employer]'s azelaic acid  12% if insurance does not cover 15%. - Continue to avoid spironolactone .     No follow-ups on file.  I, Jetta Ager, am acting as neurosurgeon for [employer], DO.  Documentation: I have reviewed the above documentation for accuracy and completeness, and I agree with the above.  Delon Lenis, DO   "

## 2024-10-09 NOTE — Patient Instructions (Addendum)
 VISIT SUMMARY:  During your visit, we discussed your acne management and skin dryness. You mentioned experiencing acne flares, especially around your menstrual cycle, and increased skin dryness, likely due to the cold weather and your use of tretinoin . We reviewed your current skincare routine and made some adjustments to help manage your symptoms.  YOUR PLAN:  -ACNE VULGARIS:  Acne vulgaris is a common skin condition that causes pimples, blackheads, and cysts. It often flares up around the menstrual cycle.   We have added azelaic acid  (morning and evening)to your regimen to prevent flares and address dark spots.   You should use clindamycin  in the morning and tretinoin  at night, with azelaic acid  applied both times.   In April or May, you will increase your tretinoin  dosage to 0.05% to enhance its effectiveness.   We provided samples of Avene Ciclophate and La Roche-Posay Cicloplast for nighttime use to manage dryness. Use Eucerin Radiant Tone before applying tretinoin  to create a hydrating base. Monitor for signs of irritation and discontinue tretinoin  if you experience stinging, burning, or irritation. We sent a new prescription for clindamycin  with five refills. If your insurance does not cover azelaic acid  15%, you can use [employer]'s azelaic acid  12%. Continue to avoid spironolactone .  -SKIN DRYNESS:  Skin dryness can be caused by environmental factors like cold weather and the use of certain skincare products. To manage your dryness, use the provided samples of Avene Ciclophate and La Roche-Posay Cicloplast creams at night. Apply Eucerin Radiant Tone before tretinoin  to create a hydrating base. Make sure to moisturize regularly and monitor for any signs of irritation.  INSTRUCTIONS:  Follow the updated skincare regimen and monitor for any signs of irritation. If you experience stinging, burning, or irritation, discontinue tretinoin  and contact our office. Your new prescription for  clindamycin  has five refills. If your insurance does not cover azelaic acid  15%, you can use [employer]'s azelaic acid  12%. Continue to avoid spironolactone . We will increase your tretinoin  dosage to 0.05% in April or May.     Important Information   Due to recent changes in healthcare laws, you may see results of your pathology and/or laboratory studies on MyChart before the doctors have had a chance to review them. We understand that in some cases there may be results that are confusing or concerning to you. Please understand that not all results are received at the same time and often the doctors may need to interpret multiple results in order to provide you with the best plan of care or course of treatment. Therefore, we ask that you please give us  2 business days to thoroughly review all your results before contacting the office for clarification. Should we see a critical lab result, you will be contacted sooner.     If You Need Anything After Your Visit   If you have any questions or concerns for your doctor, please call our main line at [phone]. If no one answers, please leave a voicemail as directed and we will return your call as soon as possible. Messages left after 4 pm will be answered the following business day.    You may also send us  a message via MyChart. We typically respond to MyChart messages within 1-2 business days.  For prescription refills, please ask your pharmacy to contact our office. Our fax number is [phone].  If you have an urgent issue when the clinic is closed that cannot wait until the next business day, you can page your doctor at  the number below.     Please note that while we do our best to be available for urgent issues outside of office hours, we are not available 24/7.    If you have an urgent issue and are unable to reach us , you may choose to seek medical care at your doctor's office, retail clinic, urgent care center, or emergency room.    If you have a medical emergency, please immediately call 911 or go to the emergency department. In the event of inclement weather, please call our main line at [phone] for an update on the status of any delays or closures.  Dermatology Medication Tips: Please keep the boxes that topical medications come in in order to help keep track of the instructions about where and how to use these. Pharmacies typically print the medication instructions only on the boxes and not directly on the medication tubes.   If your medication is too expensive, please contact our office at [phone] or send us  a message through MyChart.    We are unable to tell what your co-pay for medications will be in advance as this is different depending on your insurance coverage. However, we may be able to find a substitute medication at lower cost or fill out paperwork to get insurance to cover a needed medication.    If a prior authorization is required to get your medication covered by your insurance company, please allow us  1-2 business days to complete this process.   Drug prices often vary depending on where the prescription is filled and some pharmacies may offer cheaper prices.   The website www.goodrx.com contains coupons for medications through different pharmacies. The prices here do not account for what the cost may be with help from insurance (it may be cheaper with your insurance), but the website can give you the price if you did not use any insurance.  - You can print the associated coupon and take it with your prescription to the pharmacy.  - You may also stop by our office during regular business hours and pick up a GoodRx coupon card.  - If you need your prescription sent electronically to a different pharmacy, notify our office through [facility] or by phone at [phone]

## 2024-11-10 ENCOUNTER — Ambulatory Visit (INDEPENDENT_AMBULATORY_CARE_PROVIDER_SITE_OTHER)

## 2025-07-02 ENCOUNTER — Ambulatory Visit: Admitting: Dermatology
